# Patient Record
Sex: Female | Born: 1985 | Race: White | Hispanic: No | Marital: Single | Smoking: Former smoker
Health system: Southern US, Community
[De-identification: ages and names within clinical notes are randomized; demographics above are authoritative.]

## PROBLEM LIST (undated history)

## (undated) ENCOUNTER — Emergency Department (HOSPITAL_COMMUNITY): Payer: Medicare Other

## (undated) DIAGNOSIS — E7800 Pure hypercholesterolemia, unspecified: Secondary | ICD-10-CM

## (undated) DIAGNOSIS — R519 Headache, unspecified: Secondary | ICD-10-CM

## (undated) DIAGNOSIS — G510 Bell's palsy: Secondary | ICD-10-CM

## (undated) DIAGNOSIS — G919 Hydrocephalus, unspecified: Secondary | ICD-10-CM

## (undated) DIAGNOSIS — R55 Syncope and collapse: Secondary | ICD-10-CM

## (undated) DIAGNOSIS — I213 ST elevation (STEMI) myocardial infarction of unspecified site: Secondary | ICD-10-CM

## (undated) DIAGNOSIS — R51 Headache: Secondary | ICD-10-CM

## (undated) DIAGNOSIS — I219 Acute myocardial infarction, unspecified: Secondary | ICD-10-CM

## (undated) DIAGNOSIS — Z8669 Personal history of other diseases of the nervous system and sense organs: Secondary | ICD-10-CM

## (undated) HISTORY — DX: Syncope and collapse: R55

## (undated) HISTORY — DX: Headache: R51

## (undated) HISTORY — PX: BRAIN SURGERY: SHX531

## (undated) HISTORY — PX: TONSILECTOMY, ADENOIDECTOMY, BILATERAL MYRINGOTOMY AND TUBES: SHX2538

## (undated) HISTORY — PX: MULTIPLE TOOTH EXTRACTIONS: SHX2053

## (undated) HISTORY — PX: SHUNT REPLACEMENT: SHX5403

## (undated) HISTORY — PX: CORONARY ANGIOPLASTY WITH STENT PLACEMENT: SHX49

## (undated) HISTORY — DX: ST elevation (STEMI) myocardial infarction of unspecified site: I21.3

## (undated) HISTORY — DX: Headache, unspecified: R51.9

---
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
## 2006-05-28 ENCOUNTER — Ambulatory Visit: Payer: Self-pay | Admitting: *Deleted

## 2006-05-28 ENCOUNTER — Encounter: Payer: Self-pay | Admitting: Obstetrics & Gynecology

## 2008-03-12 ENCOUNTER — Inpatient Hospital Stay (HOSPITAL_COMMUNITY): Admission: AD | Admit: 2008-03-12 | Discharge: 2008-03-12 | Payer: Self-pay | Admitting: Obstetrics and Gynecology

## 2008-07-26 ENCOUNTER — Inpatient Hospital Stay (HOSPITAL_COMMUNITY): Admission: AD | Admit: 2008-07-26 | Discharge: 2008-07-27 | Payer: Self-pay | Admitting: Obstetrics and Gynecology

## 2010-02-10 ENCOUNTER — Encounter: Payer: Self-pay | Admitting: *Deleted

## 2010-11-11 ENCOUNTER — Inpatient Hospital Stay (HOSPITAL_COMMUNITY): Payer: Self-pay

## 2010-11-11 ENCOUNTER — Inpatient Hospital Stay (HOSPITAL_COMMUNITY)
Admission: AD | Admit: 2010-11-11 | Discharge: 2010-11-11 | Disposition: A | Discharge status: Home / Self Care | Payer: Self-pay | Source: Ambulatory Visit | Attending: Obstetrics & Gynecology | Admitting: Obstetrics & Gynecology

## 2010-11-11 ENCOUNTER — Encounter (HOSPITAL_COMMUNITY): Payer: Self-pay | Admitting: *Deleted

## 2010-11-11 DIAGNOSIS — R1031 Right lower quadrant pain: Secondary | ICD-10-CM | POA: Insufficient documentation

## 2010-11-11 DIAGNOSIS — N760 Acute vaginitis: Secondary | ICD-10-CM | POA: Insufficient documentation

## 2010-11-11 DIAGNOSIS — A499 Bacterial infection, unspecified: Secondary | ICD-10-CM | POA: Insufficient documentation

## 2010-11-11 DIAGNOSIS — B9689 Other specified bacterial agents as the cause of diseases classified elsewhere: Secondary | ICD-10-CM | POA: Insufficient documentation

## 2010-11-11 HISTORY — DX: Hydrocephalus, unspecified: G91.9

## 2010-11-11 HISTORY — DX: Bell's palsy: G51.0

## 2010-11-11 LAB — URINALYSIS, ROUTINE W REFLEX MICROSCOPIC
Bilirubin Urine: NEGATIVE
Glucose, UA: NEGATIVE mg/dL
Hgb urine dipstick: NEGATIVE
Protein, ur: NEGATIVE mg/dL

## 2010-11-11 LAB — WET PREP, GENITAL: Trich, Wet Prep: NONE SEEN

## 2010-11-11 LAB — CBC
HCT: 42.4 % (ref 36.0–46.0)
Hemoglobin: 14.5 g/dL (ref 12.0–15.0)
MCHC: 34.2 g/dL (ref 30.0–36.0)
MCV: 84.8 fL (ref 78.0–100.0)

## 2010-11-11 LAB — POCT PREGNANCY, URINE: Preg Test, Ur: NEGATIVE

## 2010-11-11 MED ORDER — METRONIDAZOLE 500 MG PO TABS: 500.0000 mg | ORAL_TABLET | Freq: Two times a day (BID) | ORAL | Status: AC

## 2010-11-11 MED ORDER — CYCLOBENZAPRINE HCL 10 MG PO TABS: 10.0000 mg | ORAL_TABLET | Freq: Three times a day (TID) | ORAL | Status: AC | PRN

## 2010-11-11 MED ORDER — KETOROLAC TROMETHAMINE 60 MG/2ML IM SOLN
60.0000 mg | Freq: Once | INTRAMUSCULAR | Status: AC
  Administered 2010-11-11: 60 mg via INTRAMUSCULAR
  Filled 2010-11-11: qty 2

## 2010-11-11 NOTE — Progress Notes (Signed)
Pt c/o RLQ pain x1 week, pt taking Ibuprofen for pain with minimal relief. No fever, normal BM. Pt has a hx of ovarian cysts.

## 2010-11-11 NOTE — Progress Notes (Signed)
Pt LMP 10/28/2010, having RLQ pain x 2 wks that has become more intense.  Pt having clear/pinkish discharge x 3 days that stopped 10/21.  Pt G0 with hx of ovarian cyst.

## 2010-11-11 NOTE — ED Provider Notes (Signed)
History   Pt presents today c/o RLQ pain x 1wk that has worsened over the past couple of days. She also c/o vag dc that stopped a couple of days ago. She states she has a hx of ovarian cysts and is concerned that she has another. She denies fever, diarrhea, dysuria, or any other problems at this time.  Chief Complaint  Patient presents with  . Abdominal Pain   HPI  OB History    Grav Para Term Preterm Abortions TAB SAB Ect Mult Living   0               Past Medical History  Diagnosis Date  . Hydrocephalus   . Bell's palsy     Past Surgical History  Procedure Date  . Shunt replacement     No family history on file.  History  Substance Use Topics  . Smoking status: Current Everyday Smoker -- 0.5 packs/day  . Smokeless tobacco: Not on file  . Alcohol Use: No    Allergies: No Known Allergies  Prescriptions prior to admission  Medication Sig Dispense Refill  . acetaminophen (TYLENOL) 325 MG tablet Take by mouth every 6 (six) hours as needed. Patient used this medication for pain also.  Stated that Tylenol made her sick, so this is why she switched to Ibuprofen.       . Cetirizine HCl (ZYRTEC PO) Take 1 tablet by mouth daily as needed. Patient is using this medication for allergies.       Marland Kitchen ibuprofen (ADVIL,MOTRIN) 200 MG tablet Take 600 mg by mouth every 6 (six) hours as needed. Patient used this medication for pain.         Review of Systems  Constitutional: Negative for fever.  Cardiovascular: Negative for chest pain.  Gastrointestinal: Positive for abdominal pain. Negative for nausea, vomiting, diarrhea and constipation.  Genitourinary: Negative for dysuria, urgency, frequency, hematuria and flank pain.  Neurological: Negative for dizziness and headaches.  Psychiatric/Behavioral: Negative for depression and suicidal ideas.   Physical Exam   Blood pressure 121/55, pulse 78, temperature 98.6 F (37 C), temperature source Oral, resp. rate 16, height 5\' 3"  (1.6 m),  weight 182 lb 12.8 oz (82.918 kg), last menstrual period 10/28/2010.  Physical Exam  Nursing note and vitals reviewed. Constitutional: She is oriented to person, place, and time. She appears well-developed and well-nourished. No distress.  HENT:  Head: Normocephalic and atraumatic.  Eyes: EOM are normal. Pupils are equal, round, and reactive to light.  GI: Soft. She exhibits no distension. There is tenderness. There is no rebound and no guarding.  Genitourinary: No bleeding around the vagina. Vaginal discharge found.       Cervix Lg/closed. Bimanual Exam difficult secondary to pt discomfort.  Neurological: She is alert and oriented to person, place, and time.  Skin: Skin is warm and dry. She is not diaphoretic.  Psychiatric: She has a normal mood and affect. Her behavior is normal. Judgment and thought content normal.    MAU Course  Procedures  Wet prep and GC/Chlamydia cultures done.  Results for orders placed during the hospital encounter of 11/11/10 (from the past 24 hour(s))  URINALYSIS, ROUTINE W REFLEX MICROSCOPIC     Status: Normal   Collection Time   11/11/10  8:25 PM      Component Value Range   Color, Urine YELLOW  YELLOW    Appearance CLEAR  CLEAR    Specific Gravity, Urine 1.010  1.005 - 1.030    pH 6.0  5.0 - 8.0    Glucose, UA NEGATIVE  NEGATIVE (mg/dL)   Hgb urine dipstick NEGATIVE  NEGATIVE    Bilirubin Urine NEGATIVE  NEGATIVE    Ketones, ur NEGATIVE  NEGATIVE (mg/dL)   Protein, ur NEGATIVE  NEGATIVE (mg/dL)   Urobilinogen, UA 0.2  0.0 - 1.0 (mg/dL)   Nitrite NEGATIVE  NEGATIVE    Leukocytes, UA NEGATIVE  NEGATIVE   POCT PREGNANCY, URINE     Status: Normal   Collection Time   11/11/10  8:33 PM      Component Value Range   Preg Test, Ur NEGATIVE    CBC     Status: Abnormal   Collection Time   11/11/10  8:50 PM      Component Value Range   WBC 12.0 (*) 4.0 - 10.5 (K/uL)   RBC 5.00  3.87 - 5.11 (MIL/uL)   Hemoglobin 14.5  12.0 - 15.0 (g/dL)   HCT 16.1   09.6 - 04.5 (%)   MCV 84.8  78.0 - 100.0 (fL)   MCH 29.0  26.0 - 34.0 (pg)   MCHC 34.2  30.0 - 36.0 (g/dL)   RDW 40.9  81.1 - 91.4 (%)   Platelets 276  150 - 400 (K/uL)  WET PREP, GENITAL     Status: Abnormal   Collection Time   11/11/10  8:52 PM      Component Value Range   Yeast, Wet Prep NONE SEEN  NONE SEEN    Trich, Wet Prep NONE SEEN  NONE SEEN    Clue Cells, Wet Prep MODERATE (*) NONE SEEN    WBC, Wet Prep HPF POC FEW (*) NONE SEEN     US Transvaginal Non-ob  11/11/2010  *RADIOLOGY REPORT*  Clinical Data: Right lower quadrant abdominal pain.  TRANSABDOMINAL AND TRANSVAGINAL ULTRASOUND OF PELVIS Technique:  Both transabdominal and transvaginal ultrasound examinations of the pelvis were performed. Transabdominal technique was performed for global imaging of the pelvis including uterus, ovaries, adnexal regions, and pelvic cul-de-sac.  Comparison: Pelvic ultrasound performed 01/23/2004   It was necessary to proceed with endovaginal exam following the transabdominal exam to visualize the uterus and ovaries.  Findings:  Uterus: Normal in size and appearance; measures 6.5 x 2.9 x 3.6 cm.  Endometrium: Normal in thickness and appearance; measures 0.7 cm in thickness.  Right ovary:  Normal appearance/no adnexal mass; measures 3.4 x 2.8 x 2.5 cm.  Left ovary: Normal appearance/no adnexal mass; measures 3.2 x 2.5 x 2.9 cm.  Other findings: No free fluid seen within the pelvic cul-de-sac.  IMPRESSION: Normal study. No evidence of pelvic mass or other significant abnormality.  Original Report Authenticated By: Tonia Ghent, M.D.   US Pelvis Complete  11/11/2010  *RADIOLOGY REPORT*  Clinical Data: Right lower quadrant abdominal pain.  TRANSABDOMINAL AND TRANSVAGINAL ULTRASOUND OF PELVIS Technique:  Both transabdominal and transvaginal ultrasound examinations of the pelvis were performed. Transabdominal technique was performed for global imaging of the pelvis including uterus, ovaries, adnexal  regions, and pelvic cul-de-sac.  Comparison: Pelvic ultrasound performed 01/23/2004   It was necessary to proceed with endovaginal exam following the transabdominal exam to visualize the uterus and ovaries.  Findings:  Uterus: Normal in size and appearance; measures 6.5 x 2.9 x 3.6 cm.  Endometrium: Normal in thickness and appearance; measures 0.7 cm in thickness.  Right ovary:  Normal appearance/no adnexal mass; measures 3.4 x 2.8 x 2.5 cm.  Left ovary: Normal appearance/no adnexal mass; measures 3.2 x 2.5 x 2.9 cm.  Other findings: No free fluid seen within the pelvic cul-de-sac.  IMPRESSION: Normal study. No evidence of pelvic mass or other significant abnormality.  Original Report Authenticated By: Tonia Ghent, M.D.    Assessment and Plan  BV: discussed with pt at length. Will give Rx for Flagyl. Warned of antabuse reaction.  RLQ pain: no clear gyn etiology at this time. Appendicitis is very low on the differential given that the pain has been ongoing for 1wk and pt is afebrile. However, will give precautions. Pt likely has muscle strain. Will give Rx for flexeril. She will f/u with her PCP. Discussed diet, activity, risks, and precautions.  Clinton Gallant. Rice III, DrHSc, MPAS, PA-C  11/11/2010, 8:56 PM   Henrietta Hoover, PA 11/11/10 2203

## 2010-11-12 LAB — GC/CHLAMYDIA PROBE AMP, GENITAL
Chlamydia, DNA Probe: NEGATIVE
GC Probe Amp, Genital: NEGATIVE

## 2011-06-08 ENCOUNTER — Emergency Department (HOSPITAL_COMMUNITY)
Admission: EM | Admit: 2011-06-08 | Discharge: 2011-06-08 | Disposition: A | Discharge status: Home / Self Care | Payer: Self-pay | Attending: Emergency Medicine | Admitting: Emergency Medicine

## 2011-06-08 ENCOUNTER — Emergency Department (HOSPITAL_COMMUNITY): Payer: Self-pay

## 2011-06-08 ENCOUNTER — Encounter (HOSPITAL_COMMUNITY): Payer: Self-pay | Admitting: Physical Medicine and Rehabilitation

## 2011-06-08 DIAGNOSIS — R1013 Epigastric pain: Secondary | ICD-10-CM | POA: Insufficient documentation

## 2011-06-08 DIAGNOSIS — M545 Low back pain, unspecified: Secondary | ICD-10-CM | POA: Insufficient documentation

## 2011-06-08 DIAGNOSIS — R143 Flatulence: Secondary | ICD-10-CM | POA: Insufficient documentation

## 2011-06-08 DIAGNOSIS — R109 Unspecified abdominal pain: Secondary | ICD-10-CM | POA: Insufficient documentation

## 2011-06-08 DIAGNOSIS — R10816 Epigastric abdominal tenderness: Secondary | ICD-10-CM | POA: Insufficient documentation

## 2011-06-08 DIAGNOSIS — R197 Diarrhea, unspecified: Secondary | ICD-10-CM | POA: Insufficient documentation

## 2011-06-08 DIAGNOSIS — F17200 Nicotine dependence, unspecified, uncomplicated: Secondary | ICD-10-CM | POA: Insufficient documentation

## 2011-06-08 DIAGNOSIS — R142 Eructation: Secondary | ICD-10-CM | POA: Insufficient documentation

## 2011-06-08 DIAGNOSIS — R141 Gas pain: Secondary | ICD-10-CM | POA: Insufficient documentation

## 2011-06-08 DIAGNOSIS — R350 Frequency of micturition: Secondary | ICD-10-CM | POA: Insufficient documentation

## 2011-06-08 LAB — DIFFERENTIAL
Basophils Absolute: 0.1 10*3/uL (ref 0.0–0.1)
Lymphocytes Relative: 46 % (ref 12–46)
Monocytes Absolute: 0.6 10*3/uL (ref 0.1–1.0)
Monocytes Relative: 6 % (ref 3–12)
Neutro Abs: 4.3 10*3/uL (ref 1.7–7.7)

## 2011-06-08 LAB — URINALYSIS, ROUTINE W REFLEX MICROSCOPIC
Bilirubin Urine: NEGATIVE
Ketones, ur: NEGATIVE mg/dL
Nitrite: NEGATIVE
Specific Gravity, Urine: 1.016 (ref 1.005–1.030)
Urobilinogen, UA: 0.2 mg/dL (ref 0.0–1.0)

## 2011-06-08 LAB — COMPREHENSIVE METABOLIC PANEL
BUN: 9 mg/dL (ref 6–23)
Calcium: 9.8 mg/dL (ref 8.4–10.5)
Creatinine, Ser: 0.71 mg/dL (ref 0.50–1.10)
GFR calc Af Amer: >90 mL/min (ref >90)
Glucose, Bld: 74 mg/dL (ref 70–99)
Total Protein: 7 g/dL (ref 6.0–8.3)

## 2011-06-08 LAB — CBC
HCT: 41.7 % (ref 36.0–46.0)
Hemoglobin: 14.5 g/dL (ref 12.0–15.0)
RBC: 4.94 MIL/uL (ref 3.87–5.11)
RDW: 12.5 % (ref 11.5–15.5)
WBC: 10.1 10*3/uL (ref 4.0–10.5)

## 2011-06-08 MED ORDER — SODIUM CHLORIDE 0.9 % IV BOLUS (SEPSIS)
1000.0000 mL | Freq: Once | INTRAVENOUS | Status: AC
  Administered 2011-06-08: 1000 mL via INTRAVENOUS

## 2011-06-08 MED ORDER — ONDANSETRON HCL 4 MG/2ML IJ SOLN
4.0000 mg | Freq: Once | INTRAMUSCULAR | Status: AC
  Administered 2011-06-08: 4 mg via INTRAVENOUS
  Filled 2011-06-08: qty 2

## 2011-06-08 MED ORDER — HYDROMORPHONE HCL PF 1 MG/ML IJ SOLN
1.0000 mg | Freq: Once | INTRAMUSCULAR | Status: AC
  Administered 2011-06-08: 1 mg via INTRAVENOUS
  Filled 2011-06-08: qty 1

## 2011-06-08 MED ORDER — TRAMADOL HCL 50 MG PO TABS: 50.0000 mg | ORAL_TABLET | Freq: Four times a day (QID) | ORAL | Status: AC | PRN

## 2011-06-08 NOTE — ED Notes (Signed)
Patient transported to Ultrasound 

## 2011-06-08 NOTE — ED Provider Notes (Signed)
History  Scribed for Gerhard Munch, MD, the patient was seen in room STRE7/STRE7. This chart was scribed by Candelaria Stagers. The patient's care started at 3:54 PM    CSN: [id number]  Arrival date & time 06/08/11  1450   None     Chief Complaint  Patient presents with  . Abdominal Pain  . Back Pain     Abdominal Pain This is a new problem. Associated symptoms include diarrhea and frequency. Pertinent negatives include no fever, nausea or vomiting.   Maria Ellis is a 26 y.o. female who presents to the Emergency Department complaining of abdominal pain that radiates to her lower back on the right side that started several days ago.  She is also experiencing diarrhea increased urination.  She denies vomiting, nausea, or fever.  She has used a heating pad and has taken prescribed pain medication.  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    Past Medical History  Diagnosis Date  . Hydrocephalus   . Bell's palsy     Past Surgical History  Procedure Date  . Shunt replacement     No family history on file.  History  Substance Use Topics  . Smoking status: Current Everyday Smoker -- 0.5 packs/day  . Smokeless tobacco: Not on file  . Alcohol Use: No    OB History    Grav Para Term Preterm Abortions TAB SAB Ect Mult Living   0               Review of Systems  Constitutional: Negative for fever and chills.  Respiratory: Negative for shortness of breath.   Gastrointestinal: Positive for abdominal pain, diarrhea and abdominal distention. Negative for nausea and vomiting.  Genitourinary: Positive for frequency.  Musculoskeletal: Positive for back pain.  Neurological: Negative for weakness.    Allergies  Tylenol  Home Medications   Current Outpatient Rx  Name Route Sig Dispense Refill  . ZYRTEC PO Oral Take 1 tablet by mouth daily as needed. Patient is using this medication for allergies.     . IBUPROFEN 200 MG PO TABS Oral Take 600 mg by mouth  every 6 (six) hours as needed. Patient used this medication for pain.       BP 110/66  Pulse 86  Temp(Src) 98.4 F (36.9 C) (Oral)  Resp 16  SpO2 98%  Physical Exam  Nursing note and vitals reviewed. Constitutional: She is oriented to person, place, and time. She appears well-developed and well-nourished. No distress.  HENT:  Head: Normocephalic and atraumatic.  Eyes: EOM are normal.  Neck: Neck supple. No tracheal deviation present.  Cardiovascular: Normal rate.   Pulmonary/Chest: Effort normal. No respiratory distress.  Abdominal: Bowel sounds are normal. There is tenderness.       Mild epigastric pain.  Murphy's sign present.   Musculoskeletal: Normal range of motion.  Neurological: She is alert and oriented to person, place, and time.  Skin: Skin is warm and dry.  Psychiatric: She has a normal mood and affect. Her behavior is normal.    ED Course  Procedures     COORDINATION OF CARE:  3:03PM Ordered: Pregnancy, urine POC, Urinalysis, Routine w relfex microscopic, POCT Pregnancy, Urine (NOT at [facility])  4:06PM Ordered: CBC, Differential, Lipase, blood;US Abdomen Complete    Labs Reviewed  DIFFERENTIAL - Abnormal; Notable for the following:    Lymphs Abs 4.6 (*)    All other components within normal limits  URINALYSIS, ROUTINE W REFLEX  MICROSCOPIC  POCT PREGNANCY, URINE  CBC  COMPREHENSIVE METABOLIC PANEL  LIPASE, BLOOD   No results found.   No diagnosis found.    MDM  I personally performed the services described in this documentation, which was scribed in my presence. The recorded information has been reviewed and considered.  This generally well appearing young F p/w LUQ / L flank pain.  On exam she is in no distress.  Given the patient's story, her noted Hx of GB disease and the positive Murphy's sign, there was concern for acute cholecystitis (vs. Pyelo or UTI).  Labs, [place] all reassuring.  The patient was resting comfortably on re-eval and was d/c w  community clinic f/u for further eval of her abdominal pain.    Gerhard Munch, MD 06/08/11 [phone]

## 2011-06-08 NOTE — Discharge Instructions (Signed)

## 2011-06-08 NOTE — ED Notes (Signed)
Pt presents to department for evaluation of abdominal pain and R sided back pain. Ongoing for several days. Pt also states "abdomen is bigger than usual." soft and non tender upon exam. No nausea/vomiting. No fever. States urinary frequency. She is alert and oriented x4. No signs of distress at the time.

## 2011-06-27 ENCOUNTER — Ambulatory Visit (INDEPENDENT_AMBULATORY_CARE_PROVIDER_SITE_OTHER): Payer: Self-pay | Admitting: Surgery

## 2011-07-02 ENCOUNTER — Encounter (INDEPENDENT_AMBULATORY_CARE_PROVIDER_SITE_OTHER): Payer: Self-pay | Admitting: Surgery

## 2012-04-04 ENCOUNTER — Encounter (HOSPITAL_COMMUNITY): Payer: Self-pay | Admitting: Family Medicine

## 2012-04-04 ENCOUNTER — Emergency Department (HOSPITAL_COMMUNITY)
Admission: EM | Admit: 2012-04-04 | Discharge: 2012-04-04 | Disposition: A | Discharge status: Home / Self Care | Payer: Self-pay | Attending: Emergency Medicine | Admitting: Emergency Medicine

## 2012-04-04 DIAGNOSIS — Z9889 Other specified postprocedural states: Secondary | ICD-10-CM | POA: Insufficient documentation

## 2012-04-04 DIAGNOSIS — G8929 Other chronic pain: Secondary | ICD-10-CM | POA: Insufficient documentation

## 2012-04-04 DIAGNOSIS — K029 Dental caries, unspecified: Secondary | ICD-10-CM | POA: Insufficient documentation

## 2012-04-04 DIAGNOSIS — K0889 Other specified disorders of teeth and supporting structures: Secondary | ICD-10-CM

## 2012-04-04 DIAGNOSIS — K089 Disorder of teeth and supporting structures, unspecified: Secondary | ICD-10-CM | POA: Insufficient documentation

## 2012-04-04 DIAGNOSIS — K027 Dental root caries: Secondary | ICD-10-CM

## 2012-04-04 DIAGNOSIS — Z8669 Personal history of other diseases of the nervous system and sense organs: Secondary | ICD-10-CM | POA: Insufficient documentation

## 2012-04-04 DIAGNOSIS — Z982 Presence of cerebrospinal fluid drainage device: Secondary | ICD-10-CM | POA: Insufficient documentation

## 2012-04-04 DIAGNOSIS — F17200 Nicotine dependence, unspecified, uncomplicated: Secondary | ICD-10-CM | POA: Insufficient documentation

## 2012-04-04 MED ORDER — OXYCODONE HCL 5 MG PO TABS: 5.0000 mg | ORAL_TABLET | ORAL | Status: DC | PRN

## 2012-04-04 MED ORDER — PENICILLIN V POTASSIUM 250 MG PO TABS
500.0000 mg | ORAL_TABLET | Freq: Once | ORAL | Status: AC
  Administered 2012-04-04: 500 mg via ORAL
  Filled 2012-04-04: qty 2

## 2012-04-04 MED ORDER — OXYCODONE HCL 5 MG PO TABS
5.0000 mg | ORAL_TABLET | Freq: Once | ORAL | Status: AC
  Administered 2012-04-04: 5 mg via ORAL
  Filled 2012-04-04: qty 1

## 2012-04-04 MED ORDER — PENICILLIN V POTASSIUM 500 MG PO TABS: 500.0000 mg | ORAL_TABLET | Freq: Three times a day (TID) | ORAL | Status: DC

## 2012-04-04 NOTE — ED Notes (Signed)
Pt st's right upper back tooth was broken while another tooth was being pulled several months ago.  Now tooth is very painful.

## 2012-04-04 NOTE — ED Notes (Signed)
Per pt abscessed tooth right upper area. sts she has an appointment with dentist on Tuesday,

## 2012-04-04 NOTE — ED Provider Notes (Signed)
History    This chart was scribed for non-physician practitioner working with Hurman Horn, MD by Melba Coon, ED Scribe. This patient was seen in room TR10C/TR10C and the patient's care was started at 4:06PM.     CSN: [id number]  Arrival date & time 04/04/12  1451   First MD Initiated Contact with Patient 04/04/12 1526      Chief Complaint  Patient presents with  . Dental Pain    (Consider location/radiation/quality/duration/timing/severity/associated sxs/prior treatment) The history is provided by the patient. No language interpreter was used.   Maria Ellis is a 27 y.o. female who presents to the Emergency Department complaining of constant, moderate to severe right upper dental pain with an onset 3-4 days that is getting progressively worse.  She reports that, a couple of years ago, she had a dental procedure in [facility] and she reports that, during the procedure, the operating dentists caused an accidental dental fracture which then started her chronic history of dental pain. Ibuprofen mildly alleviate the pain. She reports she has an appointment with her dentist in 2 days. Denies fever, neck pain, sore throat, rash, back pain, CP, SOB, abdominal pain, nausea, emesis, diarrhea, dysuria, or extremity pain, edema, weakness, numbness, or tingling. Allergic to Tylenol. No other pertinent medical symptoms. She is a current everyday smoker.  Past Medical History  Diagnosis Date  . Hydrocephalus   . Bell's palsy     Past Surgical History  Procedure Laterality Date  . Shunt replacement      History reviewed. No pertinent family history.  History  Substance Use Topics  . Smoking status: Current Every Day Smoker -- 0.50 packs/day  . Smokeless tobacco: Not on file  . Alcohol Use: No    OB History   Grav Para Term Preterm Abortions TAB SAB Ect Mult Living   0               Review of Systems 10 Systems reviewed and all are negative for acute change except as noted in  the HPI.   Allergies  Tylenol  Home Medications   Current Outpatient Rx  Name  Route  Sig  Dispense  Refill  . ibuprofen (ADVIL,MOTRIN) 200 MG tablet   Oral   Take 600 mg by mouth every 6 (six) hours as needed for pain.            BP 128/80  Pulse 83  Temp(Src) 98.3 F (36.8 C)  Resp 16  SpO2 98%  LMP 04/04/2012  Physical Exam  Nursing note and vitals reviewed. Constitutional: She is oriented to person, place, and time. She appears well-developed and well-nourished. No distress.  HENT:  Head: Normocephalic.  Dental exam: dentition shows mild decay at tooth #4 with partial subacute fracture with exposure of pulp. Minimal gingival swelling present without pointing abscess. Some submental adenopathy present.  Eyes: EOM are normal.  Neck: Neck supple. No tracheal deviation present.  Cardiovascular: Normal rate.   Pulmonary/Chest: Effort normal. No respiratory distress.  Musculoskeletal: Normal range of motion.  Neurological: She is alert and oriented to person, place, and time.  Skin: Skin is warm and dry.  Psychiatric: She has a normal mood and affect. Her behavior is normal.    ED Course  Procedures (including critical care time)  DIAGNOSTIC STUDIES: Oxygen Saturation is 98% on room air, normal by my interpretation.    COORDINATION OF CARE:  4:09PM - oxycodone and penicillin will be ordered for Rosabel M Gellatly. She is advised  to keep her appointment with her dentist and follow up as soon as possible. She is ready for d/c.   Labs Reviewed - No data to display No results found.   No diagnosis found.  1. Dental pain 2.   MDM  nonacute dental pain secondary to decay.   I personally performed the services described in this documentation, which was scribed in my presence. The recorded information has been reviewed and is accurate.         Arnoldo Hooker, PA-C 04/06/12 [phone]

## 2012-04-06 NOTE — ED Provider Notes (Signed)
Medical screening examination/treatment/procedure(s) were performed by non-physician practitioner and as supervising physician I was immediately available for consultation/collaboration.   Tyasia Packard M Susie Pousson, MD 04/06/12 1726 

## 2012-05-21 ENCOUNTER — Encounter (HOSPITAL_COMMUNITY): Payer: Self-pay | Admitting: Emergency Medicine

## 2012-05-21 ENCOUNTER — Emergency Department (HOSPITAL_COMMUNITY)
Admission: EM | Admit: 2012-05-21 | Discharge: 2012-05-21 | Disposition: A | Discharge status: Home / Self Care | Payer: Self-pay | Attending: Emergency Medicine | Admitting: Emergency Medicine

## 2012-05-21 DIAGNOSIS — Z8669 Personal history of other diseases of the nervous system and sense organs: Secondary | ICD-10-CM | POA: Insufficient documentation

## 2012-05-21 DIAGNOSIS — F17200 Nicotine dependence, unspecified, uncomplicated: Secondary | ICD-10-CM | POA: Insufficient documentation

## 2012-05-21 DIAGNOSIS — R220 Localized swelling, mass and lump, head: Secondary | ICD-10-CM | POA: Insufficient documentation

## 2012-05-21 DIAGNOSIS — R221 Localized swelling, mass and lump, neck: Secondary | ICD-10-CM | POA: Insufficient documentation

## 2012-05-21 DIAGNOSIS — Z880 Allergy status to penicillin: Secondary | ICD-10-CM | POA: Insufficient documentation

## 2012-05-21 DIAGNOSIS — K029 Dental caries, unspecified: Secondary | ICD-10-CM | POA: Insufficient documentation

## 2012-05-21 DIAGNOSIS — K0889 Other specified disorders of teeth and supporting structures: Secondary | ICD-10-CM

## 2012-05-21 DIAGNOSIS — K089 Disorder of teeth and supporting structures, unspecified: Secondary | ICD-10-CM | POA: Insufficient documentation

## 2012-05-21 MED ORDER — TRAMADOL HCL 50 MG PO TABS: 50.0000 mg | ORAL_TABLET | Freq: Four times a day (QID) | ORAL | Status: DC | PRN

## 2012-05-21 MED ORDER — CLINDAMYCIN HCL 150 MG PO CAPS
300.0000 mg | ORAL_CAPSULE | Freq: Three times a day (TID) | ORAL | Status: DC
Start: 2012-05-21 — End: 2012-06-13

## 2012-05-21 NOTE — ED Notes (Signed)
PT. REPORTS LEFT UPPER INCISOR PAIN FOR SEVERAL DAYS UNRELIEVED BY OTC IBUPROFEN .

## 2012-05-21 NOTE — ED Provider Notes (Signed)
History    This chart was scribed for non-physician practitioner Raymon Mutton, PA-C working with Rolan Bucco, MD by Toya Smothers, ED Scribe. This patient was seen in room TR07C/TR07C and the patient's care was started at 11:14 PM.   CSN: [id number]  Arrival date & time 05/21/12  2112   First MD Initiated Contact with Patient 05/21/12 2229      Chief Complaint  Patient presents with  . Dental Pain   Patient is a 27 y.o. female presenting with tooth pain. The history is provided by the patient. No language interpreter was used.  Dental PainPrimary symptoms do not include headaches, fever, shortness of breath or sore throat.  Additional symptoms include: facial swelling. Additional symptoms do not include: trouble swallowing.    Maria Ellis is a 27 y.o. female who presents to the Emergency Department complaining of 3 days of recurrent oral abscess. Pain is constant, sharp, radiating from left upper maxillary to lower left side, and worse with chewing. Pt denies recent oral surgery. Despite taking Ibuprofen, there has been no improvement to pain. No fever, chills, cough, congestion, rhinorrhea, chest pain, SOB, or n/v/d. Pt is a current everyday smoker, denying alcohol and illicit drug use. Allergies include     Past Medical History  Diagnosis Date  . Hydrocephalus   . Bell's palsy     Past Surgical History  Procedure Laterality Date  . Shunt replacement      No family history on file.  History  Substance Use Topics  . Smoking status: Current Every Day Smoker -- 0.50 packs/day  . Smokeless tobacco: Not on file  . Alcohol Use: No    Review of Systems  Constitutional: Negative for fever and diaphoresis.  HENT: Positive for facial swelling and dental problem. Negative for sore throat, trouble swallowing, neck pain, neck stiffness and voice change.   Eyes: Negative for visual disturbance.  Respiratory: Negative for apnea, chest tightness and shortness of breath.    Cardiovascular: Negative for chest pain and palpitations.  Gastrointestinal: Negative for nausea, vomiting, diarrhea and constipation.  Genitourinary: Negative for dysuria.  Musculoskeletal: Negative for gait problem.  Skin: Negative for rash.  Neurological: Negative for dizziness, weakness, light-headedness, numbness and headaches.  All other systems reviewed and are negative.    Allergies  Penicillins and Tylenol  Home Medications   Current Outpatient Rx  Name  Route  Sig  Dispense  Refill  . ibuprofen (ADVIL,MOTRIN) 200 MG tablet   Oral   Take 600 mg by mouth every 6 (six) hours as needed for pain.          . clindamycin (CLEOCIN) 150 MG capsule   Oral   Take 2 capsules (300 mg total) by mouth 3 (three) times daily.   60 capsule   0   . traMADol (ULTRAM) 50 MG tablet   Oral   Take 1 tablet (50 mg total) by mouth every 6 (six) hours as needed for pain.   10 tablet   0     BP 129/81  Pulse 102  Temp(Src) 98.8 F (37.1 C) (Oral)  Resp 18  SpO2 97%  LMP 04/29/2012  Physical Exam  Nursing note and vitals reviewed. Constitutional: She is oriented to person, place, and time. She appears well-developed and well-nourished. No distress.  HENT:  Head: Normocephalic and atraumatic. No trismus in the jaw.    Mouth/Throat: Uvula is midline, oropharynx is clear and moist and mucous membranes are normal. Mucous membranes are not pale and  not dry. She does not have dentures. No oral lesions. Dental caries present. No dental abscesses, edematous or lacerations. No oropharyngeal exudate, posterior oropharyngeal edema, posterior oropharyngeal erythema or tonsillar abscesses.    Uvula midline, symmetrical  Mouth: Facial swelling noted to the left side - no erythema, inflammation, rash, lesions noted to external face.  Discomfort upon palpation to the left maxillary region, left cheek, left aspect of vermillion border Dental caries noted all over mouth. Negative  inflammation, erythema, lesions, sores to buccal mucosa, maxillary gums, mandibular gums Discomfort with palpation to the left upper and lower gums with tongue depressor Negative peritonsillar abscess noted No sublingual lesion noted Negative trismus  Eyes: Conjunctivae and EOM are normal. Pupils are equal, round, and reactive to light. Right eye exhibits no discharge. Left eye exhibits no discharge.  Neck: Normal range of motion. Neck supple. No tracheal deviation present.  Negative lymphadenopathy Negative nuchal rigidity Negative neck stiffness  Cardiovascular: Normal rate and regular rhythm.  Exam reveals no friction rub.   No murmur heard. Radial pulses 2+ bilaterally  Pulmonary/Chest: Effort normal and breath sounds normal. No respiratory distress. She has no wheezes. She has no rales.  Musculoskeletal: Normal range of motion. She exhibits no edema.  Lymphadenopathy:    She has no cervical adenopathy.  Neurological: She is alert and oriented to person, place, and time. She has normal reflexes. No cranial nerve deficit. She exhibits normal muscle tone. Coordination normal.  Cranial nerves III-XII grossly intact  Skin: Skin is warm and dry. No rash noted. She is not diaphoretic. No erythema.  Psychiatric: She has a normal mood and affect. Her behavior is normal. Thought content normal.    ED Course  Procedures DIAGNOSTIC STUDIES: Oxygen Saturation is 97% on room air, normal by my interpretation.    COORDINATION OF CARE: 23:14- Evaluated Pt. Pt is awake, alert, and without distress. 23:20- Patient understands and agrees with initial ED impression and plan with expectations set for ED visit.   Plan: Home Medications- Antibiotics ; Recommended follow up- Oral Surgeon on Monday   Labs Reviewed - No data to display No results found.   1. Pain, dental       MDM  I personally performed the services described in this documentation, which was scribed in my presence. The  recorded information has been reviewed and is accurate.  I personally evaluated and examined the patient. Patient afebrile, normotensive, non-tachycardic, alert and oriented. Facial swelling noted to the left side. Negative peritonsillar abscess. Negative Ludwig's angina. Negative abscess or cyst formation to mouth. Numerous dental caries, cracked teeth scattered to upper and lower jaws. Patient aseptic, non-toxic appearing, in no acute distress - discharged patient. Patient given antibiotic coverage and pain medications - discussed course, restrictions, and precautions while on pain medications. Referred patient to dentist - resource guide given. Discussed with patient to ice. Discussed with patient to monitor symptoms and if symptoms are to worsen or change to please report back to the ED. Patient agreed to plan of care, understood, all questions answered.   Raymon Mutton, PA-C 05/22/12 [phone]

## 2012-05-21 NOTE — ED Notes (Signed)
Report to eme, rn 

## 2012-05-22 NOTE — ED Provider Notes (Signed)
Medical screening examination/treatment/procedure(s) were performed by non-physician practitioner and as supervising physician I was immediately available for consultation/collaboration.   Lynkin Saini, MD 05/22/12 1530 

## 2012-06-13 ENCOUNTER — Emergency Department (HOSPITAL_COMMUNITY): Payer: Self-pay

## 2012-06-13 ENCOUNTER — Emergency Department (HOSPITAL_COMMUNITY)
Admission: EM | Admit: 2012-06-13 | Discharge: 2012-06-13 | Disposition: A | Discharge status: Home / Self Care | Payer: Self-pay | Attending: Emergency Medicine | Admitting: Emergency Medicine

## 2012-06-13 ENCOUNTER — Encounter (HOSPITAL_COMMUNITY): Payer: Self-pay | Admitting: Nurse Practitioner

## 2012-06-13 DIAGNOSIS — G911 Obstructive hydrocephalus: Secondary | ICD-10-CM | POA: Insufficient documentation

## 2012-06-13 DIAGNOSIS — G919 Hydrocephalus, unspecified: Secondary | ICD-10-CM

## 2012-06-13 DIAGNOSIS — Z8669 Personal history of other diseases of the nervous system and sense organs: Secondary | ICD-10-CM | POA: Insufficient documentation

## 2012-06-13 DIAGNOSIS — F17200 Nicotine dependence, unspecified, uncomplicated: Secondary | ICD-10-CM | POA: Insufficient documentation

## 2012-06-13 DIAGNOSIS — R079 Chest pain, unspecified: Secondary | ICD-10-CM | POA: Insufficient documentation

## 2012-06-13 DIAGNOSIS — IMO0001 Reserved for inherently not codable concepts without codable children: Secondary | ICD-10-CM | POA: Insufficient documentation

## 2012-06-13 DIAGNOSIS — M7918 Myalgia, other site: Secondary | ICD-10-CM

## 2012-06-13 DIAGNOSIS — R1012 Left upper quadrant pain: Secondary | ICD-10-CM | POA: Insufficient documentation

## 2012-06-13 DIAGNOSIS — Z3202 Encounter for pregnancy test, result negative: Secondary | ICD-10-CM | POA: Insufficient documentation

## 2012-06-13 LAB — BASIC METABOLIC PANEL
CO2: 22 mEq/L (ref 19–32)
Chloride: 104 mEq/L (ref 96–112)
GFR calc non Af Amer: >90 mL/min (ref >90)
Glucose, Bld: 136 mg/dL — ABNORMAL HIGH (ref 70–99)
Potassium: 3.6 mEq/L (ref 3.5–5.1)
Sodium: 137 mEq/L (ref 135–145)

## 2012-06-13 LAB — CBC
HCT: 37.6 % (ref 36.0–46.0)
Hemoglobin: 13.2 g/dL (ref 12.0–15.0)
MCH: 29.2 pg (ref 26.0–34.0)
MCV: 83.2 fL (ref 78.0–100.0)
RBC: 4.52 MIL/uL (ref 3.87–5.11)

## 2012-06-13 LAB — POCT PREGNANCY, URINE: Preg Test, Ur: NEGATIVE

## 2012-06-13 LAB — URINALYSIS, ROUTINE W REFLEX MICROSCOPIC
Glucose, UA: NEGATIVE mg/dL
Leukocytes, UA: NEGATIVE
Protein, ur: NEGATIVE mg/dL

## 2012-06-13 LAB — POCT I-STAT TROPONIN I: Troponin i, poc: 0.02 ng/mL (ref 0.00–0.08)

## 2012-06-13 LAB — HEPATIC FUNCTION PANEL
AST: 15 U/L (ref 0–37)
Bilirubin, Direct: <0.1 mg/dL (ref 0.0–0.3)

## 2012-06-13 MED ORDER — HYDROMORPHONE HCL PF 1 MG/ML IJ SOLN
1.0000 mg | Freq: Once | INTRAMUSCULAR | Status: AC
  Administered 2012-06-13: 1 mg via INTRAVENOUS
  Filled 2012-06-13: qty 1

## 2012-06-13 MED ORDER — TRAMADOL HCL 50 MG PO TABS: 50.0000 mg | ORAL_TABLET | Freq: Three times a day (TID) | ORAL | Status: DC | PRN

## 2012-06-13 MED ORDER — TRAMADOL HCL 50 MG PO TABS: ORAL_TABLET | ORAL | Status: DC

## 2012-06-13 NOTE — ED Notes (Signed)
States she "Feels like someone is kicking me in the chest and its hard to breathe" for past 3 days. Reports dry cough, 101.6 fever last night at home taking ibuprofen with some relief. A&Ox4, pt with mild labored breathing on exertion

## 2012-06-13 NOTE — ED Notes (Signed)
Patient transported to Ultrasound 

## 2012-06-13 NOTE — ED Notes (Signed)
Pt ambulating independently w/ steady gait on d/c in no acute distress, A&Ox4. D/c instructions reviewed w/ pt and family - pt and family deny any further questions or concerns at present. Rx given x1  

## 2012-06-13 NOTE — ED Provider Notes (Signed)
History     CSN: [id number]  Arrival date & time 06/13/12  1336   First MD Initiated Contact with Patient 06/13/12 1430      Chief Complaint  Patient presents with  . Chest Pain    (Consider location/radiation/quality/duration/timing/severity/associated sxs/prior treatment) HPI Comments: Maria Ellis is a 27 y/o F with PMHx of hydrocephalus and Bell's palsy, presenting to the ED with chest pain that has been ongoing for the past 2 days. Patient reported that the chest pain began in the substernal region and has now localizing to the left side of her chest, underneath her left breast, LUQ, and left side of her back. Patient described pain as a constant stabbing sensation - described "as if someone kicked me in the ribs." Patient reported that the pain gets worse when the patient takes a deep inhalation , breathing in general, and when laying on the left side. Patient reported that she has been having a mild dry cough for the past couple of days. Patient reported that she had a fever last night (100.6) took an Ibuprofen and stated that the fever broke. Patient reported that she was experiencing epigastric discomfort described as an intermittent pressure that lasts a couple of minutes. Stated that she noticed her tongue swell today - denied changes to diet, trouble breathing, blockage of airway, difficulty breathing, voice changes, difficulty speaking. Denied vomiting, diarrhea, light-headedness, shortness of breath, difficulty breathing, visual distortions, paresthesias.   The history is provided by the patient. No language interpreter was used.    Past Medical History  Diagnosis Date  . Hydrocephalus   . Bell's palsy     Past Surgical History  Procedure Laterality Date  . Shunt replacement      History reviewed. No pertinent family history.  History  Substance Use Topics  . Smoking status: Current Every Day Smoker -- 0.50 packs/day    Types: Cigarettes  . Smokeless tobacco: Not  on file  . Alcohol Use: No    OB History   Grav Para Term Preterm Abortions TAB SAB Ect Mult Living   0               Review of Systems  Constitutional: Positive for fever. Negative for chills and fatigue.  HENT: Negative for ear pain, sore throat, trouble swallowing, neck pain and neck stiffness.   Eyes: Negative for photophobia, pain and visual disturbance.  Respiratory: Positive for cough and chest tightness. Negative for shortness of breath.   Cardiovascular: Positive for chest pain.  Gastrointestinal: Positive for nausea and abdominal pain (LUQ). Negative for vomiting, diarrhea and constipation.  Musculoskeletal: Negative for back pain and arthralgias.  Skin: Negative for rash.  Neurological: Negative for dizziness, weakness, light-headedness and headaches.  All other systems reviewed and are negative.    Allergies  Penicillins and Tylenol  Home Medications   Current Outpatient Rx  Name  Route  Sig  Dispense  Refill  . ibuprofen (ADVIL,MOTRIN) 200 MG tablet   Oral   Take 600 mg by mouth every 6 (six) hours as needed for pain.          Marland Kitchen OVER THE COUNTER MEDICATION   Oral   Take 10 mLs by mouth every 4 (four) hours as needed ([facility] robitussin for cough.).         Marland Kitchen traMADol (ULTRAM) 50 MG tablet      Please take 1 tablet PO at night as when needed for pain.   7 tablet   0  BP 110/64  Pulse 62  Temp(Src) 98.5 F (36.9 C) (Oral)  Resp 16  SpO2 99%  LMP 04/29/2012  Physical Exam  Nursing note and vitals reviewed. Constitutional: She is oriented to person, place, and time. She appears well-developed and well-nourished. No distress.  HENT:  Head: Normocephalic and atraumatic.  Mouth/Throat: Oropharynx is clear and moist. No oropharyngeal exudate.  Uvula midline, symmetrical elevation  Negative tongue swelling noted.   Eyes: Conjunctivae and EOM are normal. Pupils are equal, round, and reactive to light. Right eye exhibits no discharge. Left  eye exhibits no discharge.  Neck: Normal range of motion. Neck supple. No tracheal deviation present. No thyromegaly present.  Negative neck stiffness Negative nuchal rigidity Negative lymphadenopathy  Cardiovascular: Normal rate, regular rhythm and normal heart sounds.  Exam reveals no friction rub.   No murmur heard. Pulses:      Radial pulses are 2+ on the right side, and 2+ on the left side.       Dorsalis pedis pulses are 2+ on the right side, and 2+ on the left side.  Pulmonary/Chest: Effort normal and breath sounds normal. No respiratory distress. She has no wheezes. She has no rales. She exhibits no tenderness.  Abdominal: Soft. Bowel sounds are normal. She exhibits no distension and no mass. There is tenderness. There is no rigidity, no rebound, no guarding and no tenderness at McBurney's point.    Tenderness upon palpation to the left mid-axillary portion of the ribs, LUQ   Musculoskeletal: Normal range of motion. She exhibits no edema and no tenderness.  Pain to left side of chest wall with left arm straight up and pressure applied and with resistance  Lymphadenopathy:    She has no cervical adenopathy.  Neurological: She is alert and oriented to person, place, and time. No cranial nerve deficit. She exhibits normal muscle tone. Coordination normal.  Skin: Skin is warm and dry. No rash noted. She is not diaphoretic. No erythema.  Psychiatric: She has a normal mood and affect. Her behavior is normal. Thought content normal.    ED Course  Procedures (including critical care time)  Dilaudid IV given for pain   Date: 06/13/2012  Rate: 87  Rhythm: normal sinus rhythm  QRS Axis: normal  Intervals: normal  ST/T Wave abnormalities: normal  Conduction Disutrbances:none  Narrative Interpretation: Normal EKG   Old EKG Reviewed: none available    Labs Reviewed  BASIC METABOLIC PANEL - Abnormal; Notable for the following:    Glucose, Bld 136 (*)    All other components  within normal limits  HEPATIC FUNCTION PANEL - Abnormal; Notable for the following:    Total Bilirubin 0.2 (*)    All other components within normal limits  PRO B NATRIURETIC PEPTIDE  CBC  LIPASE, BLOOD  URINALYSIS, ROUTINE W REFLEX MICROSCOPIC  D-DIMER, QUANTITATIVE  POCT I-STAT TROPONIN I  POCT PREGNANCY, URINE   Dg Chest 2 View  06/13/2012   *RADIOLOGY REPORT*  Clinical Data: Chest pain.  Smoker.  CHEST - 2 VIEW  Comparison: 06/10/2012.  Findings: Normal sized heart.  Clear lungs.  Stable prominence of the pulmonary vasculature and interstitial markings.  Stable right ventriculoperitoneal shunt tubing ending in the upper abdomen. Minimal scoliosis.  IMPRESSION: No acute abnormality.  Stable mild pulmonary vascular congestion and mild chronic interstitial lung disease.   Original Report Authenticated By: Beckie Salts, M.D.   US Abdomen Complete  06/13/2012   *RADIOLOGY REPORT*  Clinical Data:  Left upper quadrant pain  COMPLETE  ABDOMINAL ULTRASOUND  Comparison:  CT 06/08/2012  Findings:  Gallbladder:  No gallstones, gallbladder wall thickening, or pericholecystic fluid.  Common bile duct:  Normal 5 mm.  Liver:  No focal lesion identified.  Within normal limits in parenchymal echogenicity.  IVC:  Appears normal.  Pancreas:  No focal abnormality seen.  Spleen:  Normal in size and echogenicity.  Right Kidney:  12.5cm in length.  No evidence of hydronephrosis or stones.  Left Kidney:  10.7cm in length.  No evidence of hydronephrosis or stones.  Abdominal aorta:  No aneurysm identified.  IMPRESSION: Negative abdominal ultrasound.   Original Report Authenticated By: Genevive Bi, M.D.     1. Chest pain   2. Musculoskeletal pain   3. Hydrocephalus   4. H/O Bell's palsy       MDM  Patient afebrile, non-tachycardic, non-tachypneic, normotensive, adequate aspiration on room air, alert and oriented.  DDx: Cardiac  GERD Kidney Stones   Urine pregnancy negative UA negative findings Lipase  within normal limits (29) BMP negative findings CBC negative findings BMP within normal limits (107.5) EKG negative STEMI/NSTEMI noted  Troponins negative (0.02) Chest xray - no acute abnormality noted. Stable mild pulmonary vascular congestion and mild chronic interstitial lung disease noted.  US abdomen = negative hydronephrosis bilaterally Hepatic function panel negative findings D-dimer negative elevation - (0.30) - r/o PE  Chest pain suspected to be associated with musculoskeletal etiology - less likely cardiac due to no elevation in troponins, EKG negative findings - patient in sinus rhythm while in ED setting. Negative elevated D-Dimer. Experienced discomfort upon palpation to the left side of the chest wall - discomfort when left arm stretched - musculoskeletal. Discussed case and findings with Dr. Jinger Neighbors - cleared patient for discharge. Patient aseptic, non-toxic appearing, in no acute distress, no respiratory distress noted. Discharged patient with pain medications to be taken at night - as when needed - discussed with patient precautions. Discussed with patient to rest and stay hydrated. Discussed to use Ibuprofen throughout the day for relief. Discussed with patient follow-up with cardio, ENT, and [facility]. List of resources given. Recommended benadryl as when needed to aid in decrease tongue swelling - discussed to monitor what she ingests. Discussed with patient to monitor symptoms and if symptoms are to worsen or change to report back to the ED. Patient agreed to plan of care, understood, all questions answered.         Raymon Mutton, PA-C 06/13/12 [phone]

## 2012-06-14 NOTE — ED Provider Notes (Signed)
Medical screening examination/treatment/procedure(s) were conducted as a shared visit with non-physician practitioner(s) and myself.  I personally evaluated the patient during the encounter   Danuel Felicetti, MD 06/14/12 1840 

## 2012-11-19 DIAGNOSIS — Q054 Unspecified spina bifida with hydrocephalus: Secondary | ICD-10-CM

## 2012-11-19 DIAGNOSIS — IMO0002 Reserved for concepts with insufficient information to code with codable children: Secondary | ICD-10-CM | POA: Insufficient documentation

## 2012-11-19 HISTORY — DX: Unspecified spina bifida with hydrocephalus: Q05.4

## 2013-01-23 ENCOUNTER — Encounter (HOSPITAL_COMMUNITY): Payer: Self-pay | Admitting: Emergency Medicine

## 2013-01-23 ENCOUNTER — Emergency Department (HOSPITAL_COMMUNITY)
Admission: EM | Admit: 2013-01-23 | Discharge: 2013-01-23 | Discharge status: Against Medical Advice | Payer: Self-pay | Attending: Emergency Medicine | Admitting: Emergency Medicine

## 2013-01-23 DIAGNOSIS — R51 Headache: Secondary | ICD-10-CM | POA: Insufficient documentation

## 2013-01-23 DIAGNOSIS — R112 Nausea with vomiting, unspecified: Secondary | ICD-10-CM | POA: Insufficient documentation

## 2013-01-23 DIAGNOSIS — R52 Pain, unspecified: Secondary | ICD-10-CM | POA: Insufficient documentation

## 2013-01-23 DIAGNOSIS — F17200 Nicotine dependence, unspecified, uncomplicated: Secondary | ICD-10-CM | POA: Insufficient documentation

## 2013-01-23 DIAGNOSIS — Z982 Presence of cerebrospinal fluid drainage device: Secondary | ICD-10-CM | POA: Insufficient documentation

## 2013-01-23 DIAGNOSIS — Z87728 Personal history of other specified (corrected) congenital malformations of nervous system and sense organs: Secondary | ICD-10-CM | POA: Insufficient documentation

## 2013-01-23 LAB — URINALYSIS, ROUTINE W REFLEX MICROSCOPIC
Bilirubin Urine: NEGATIVE
GLUCOSE, UA: NEGATIVE mg/dL
Hgb urine dipstick: NEGATIVE
Ketones, ur: NEGATIVE mg/dL
Nitrite: NEGATIVE
Protein, ur: NEGATIVE mg/dL
Specific Gravity, Urine: 1.011 (ref 1.005–1.030)
Urobilinogen, UA: 1 mg/dL (ref 0.0–1.0)
pH: 6 (ref 5.0–8.0)

## 2013-01-23 LAB — URINE MICROSCOPIC-ADD ON

## 2013-01-23 LAB — POCT PREGNANCY, URINE: PREG TEST UR: NEGATIVE

## 2013-01-23 NOTE — ED Notes (Signed)
Pt st's she started having pain in back of her head, aching all over nausea and vomiting onset earlier tonight.

## 2013-01-23 NOTE — ED Notes (Signed)
Call no answer 

## 2013-01-23 NOTE — ED Notes (Signed)
Patient called again, no answer.

## 2013-04-17 ENCOUNTER — Emergency Department (HOSPITAL_COMMUNITY)
Admission: EM | Admit: 2013-04-17 | Discharge: 2013-04-17 | Disposition: A | Discharge status: Home / Self Care | Payer: Medicaid Other | Attending: Emergency Medicine | Admitting: Emergency Medicine

## 2013-04-17 ENCOUNTER — Encounter (HOSPITAL_COMMUNITY): Payer: Self-pay | Admitting: Emergency Medicine

## 2013-04-17 DIAGNOSIS — G510 Bell's palsy: Secondary | ICD-10-CM | POA: Insufficient documentation

## 2013-04-17 DIAGNOSIS — G911 Obstructive hydrocephalus: Secondary | ICD-10-CM | POA: Insufficient documentation

## 2013-04-17 DIAGNOSIS — M542 Cervicalgia: Secondary | ICD-10-CM | POA: Insufficient documentation

## 2013-04-17 DIAGNOSIS — R51 Headache: Secondary | ICD-10-CM | POA: Insufficient documentation

## 2013-04-17 DIAGNOSIS — Z8669 Personal history of other diseases of the nervous system and sense organs: Secondary | ICD-10-CM | POA: Insufficient documentation

## 2013-04-17 DIAGNOSIS — Z880 Allergy status to penicillin: Secondary | ICD-10-CM | POA: Insufficient documentation

## 2013-04-17 DIAGNOSIS — F17200 Nicotine dependence, unspecified, uncomplicated: Secondary | ICD-10-CM | POA: Insufficient documentation

## 2013-04-17 DIAGNOSIS — Z79899 Other long term (current) drug therapy: Secondary | ICD-10-CM | POA: Insufficient documentation

## 2013-04-17 DIAGNOSIS — Z888 Allergy status to other drugs, medicaments and biological substances status: Secondary | ICD-10-CM | POA: Insufficient documentation

## 2013-04-17 HISTORY — DX: Personal history of other diseases of the nervous system and sense organs: Z86.69

## 2013-04-17 MED ORDER — GABAPENTIN 300 MG PO CAPS: ORAL_CAPSULE | ORAL | Status: DC

## 2013-04-17 MED ORDER — OXYCODONE HCL 5 MG PO CAPS: 5.0000 mg | ORAL_CAPSULE | ORAL | Status: DC | PRN

## 2013-04-17 MED ORDER — OXYCODONE HCL 5 MG PO TABS
10.0000 mg | ORAL_TABLET | Freq: Once | ORAL | Status: AC
  Administered 2013-04-17: 10 mg via ORAL
  Filled 2013-04-17: qty 2

## 2013-04-17 MED ORDER — METHOCARBAMOL 500 MG PO TABS: 500.0000 mg | ORAL_TABLET | Freq: Two times a day (BID) | ORAL | Status: DC

## 2013-04-17 NOTE — ED Provider Notes (Signed)
CSN: [id number]     Arrival date & time 04/17/13  1351 History   First MD Initiated Contact with Patient 04/17/13 1510     Chief Complaint  Patient presents with  . Neck Pain     ) HPI  Patient presents with neck and head pain. 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 She states "they tell me that the pain was going away it hasn't". States she has pain most days. Primarily paraspinal into her mid thoracic spine. She does have a degree of muscle spasm associated with it. She states "it triggers migraines". She states she is indigent, she is not currently working. She has appointment at [facility] in[place]. Her first appointment is not until April 22. She states she is miserable with pain. No fevers. No vision changes. No numbness or tingling to her extremities. She was having right upper treatment tingling and numbness prior to surgery which has resolved, her headaches have not.  Past Medical History  Diagnosis Date  . Hydrocephalus   . Bell's palsy   . History of Chiari malformation    Past Surgical History  Procedure Laterality Date  . Shunt replacement    . Brain surgery     History reviewed. No pertinent family history. History  Substance Use Topics  . Smoking status: Current Every Day Smoker -- 0.50 packs/day    Types: Cigarettes  . Smokeless tobacco: Not on file  . Alcohol Use: No   OB History   Grav Para Term Preterm Abortions TAB SAB Ect Mult Living   0              Review of Systems  Constitutional: Negative for fever, chills, diaphoresis, appetite change and fatigue.  HENT: Negative for mouth sores, sore throat and trouble swallowing.   Eyes: Negative for visual disturbance.  Respiratory: Negative for cough, chest tightness, shortness of breath and wheezing.   Cardiovascular: Negative for chest pain.  Gastrointestinal: Negative for nausea, vomiting, abdominal pain, diarrhea  and abdominal distention.  Endocrine: Negative for polydipsia, polyphagia and polyuria.  Genitourinary: Negative for dysuria, frequency and hematuria.  Musculoskeletal: Positive for neck pain. Negative for gait problem.  Skin: Negative for color change, pallor and rash.  Neurological: Positive for headaches. Negative for dizziness, syncope and light-headedness.  Hematological: Does not bruise/bleed easily.  Psychiatric/Behavioral: Negative for behavioral problems and confusion.      Allergies  Penicillins and Tylenol  Home Medications   Current Outpatient Rx  Name  Route  Sig  Dispense  Refill  . ibuprofen (ADVIL,MOTRIN) 200 MG tablet   Oral   Take 400-600 mg by mouth every 6 (six) hours as needed (pain).          Marland Kitchen gabapentin (NEURONTIN) 300 MG capsule      1 po tid for 7 days, then 2 po tid if pain not improving.   90 capsule   0   . methocarbamol (ROBAXIN) 500 MG tablet   Oral   Take 1 tablet (500 mg total) by mouth 2 (two) times daily.   20 tablet   0   . oxycodone (OXY-IR) 5 MG capsule   Oral   Take 1 capsule (5 mg total) by mouth every 4 (four) hours as needed.   10 capsule   0    BP 112/57  Pulse 70  Temp(Src) 98.3 F (36.8 C) (Oral)  Resp 16  SpO2 100%  LMP 03/20/2013 Physical  Exam  Constitutional: She is oriented to person, place, and time. She appears well-developed and well-nourished. No distress.  HENT:  Head: Normocephalic.  Eyes: Conjunctivae are normal. Pupils are equal, round, and reactive to light. No scleral icterus.  Neck: Normal range of motion. Neck supple. No thyromegaly present.    Cardiovascular: Normal rate and regular rhythm.  Exam reveals no gallop and no friction rub.   No murmur heard. Pulmonary/Chest: Effort normal and breath sounds normal. No respiratory distress. She has no wheezes. She has no rales.  Abdominal: Soft. Bowel sounds are normal. She exhibits no distension. There is no tenderness. There is no rebound.    Musculoskeletal: Normal range of motion.  Neurological: She is alert and oriented to person, place, and time.  Normal extremity neurological exam. Normal cranial nerve exam.  Skin: Skin is warm and dry. No rash noted.  Psychiatric: She has a normal mood and affect. Her behavior is normal.    ED Course  Procedures (including critical care time) Labs Review Labs Reviewed - No data to display Imaging Review No results found.   EKG Interpretation None      MDM   Final diagnoses:  Neck pain    Patient with chronic pain. No acute abnormalities or concerning new symptoms. Given prescription for Robaxin and gabapentin. Limited number of 10 hydrocodone for pain.    Rolland PorterMark Lavan Imes, MD 04/22/13 1538

## 2013-04-17 NOTE — Discharge Instructions (Signed)
Musculoskeletal Pain °Musculoskeletal pain is muscle and boney aches and pains. These pains can occur in any part of the body. Your caregiver may treat you without knowing the cause of the pain. They may treat you if blood or urine tests, X-rays, and other tests were normal.  °CAUSES °There is often not a definite cause or reason for these pains. These pains may be caused by a type of germ (virus). The discomfort may also come from overuse. Overuse includes working out too hard when your body is not fit. Boney aches also come from weather changes. Bone is sensitive to atmospheric pressure changes. °HOME CARE INSTRUCTIONS  °· Ask when your test results will be ready. Make sure you get your test results. °· Only take over-the-counter or prescription medicines for pain, discomfort, or fever as directed by your caregiver. If you were given medications for your condition, do not drive, operate machinery or power tools, or sign legal documents for 24 hours. Do not drink alcohol. Do not take sleeping pills or other medications that may interfere with treatment. °· Continue all activities unless the activities cause more pain. When the pain lessens, slowly resume normal activities. Gradually increase the intensity and duration of the activities or exercise. °· During periods of severe pain, bed rest may be helpful. Lay or sit in any position that is comfortable. °· Putting ice on the injured area. °· Put ice in a bag. °· Place a towel between your skin and the bag. °· Leave the ice on for 15 to 20 minutes, 3 to 4 times a day. °· Follow up with your caregiver for continued problems and no reason can be found for the pain. If the pain becomes worse or does not go away, it may be necessary to repeat tests or do additional testing. Your caregiver may need to look further for a possible cause. °SEEK IMMEDIATE MEDICAL CARE IF: °· You have pain that is getting worse and is not relieved by medications. °· You develop chest pain  that is associated with shortness or breath, sweating, feeling sick to your stomach (nauseous), or throw up (vomit). °· Your pain becomes localized to the abdomen. °· You develop any new symptoms that seem different or that concern you. °MAKE SURE YOU:  °· Understand these instructions. °· Will watch your condition. °· Will get help right away if you are not doing well or get worse. °Document Released: 01/06/2005 Document Revised: 03/31/2011 Document Reviewed: 09/10/2012 °[employer]® Patient Information ©2014 [employer]. ° °

## 2013-04-17 NOTE — ED Notes (Signed)
Pt reports having surgery in feb and having neck pain since which causes migraines. Unable to get appt till mid April and having severe pain. Ambulatory at triage.

## 2013-05-17 ENCOUNTER — Observation Stay (HOSPITAL_COMMUNITY)
Admission: AD | Admit: 2013-05-17 | Discharge: 2013-05-19 | Disposition: A | Discharge status: Home / Self Care | Payer: Medicaid Other | Source: Ambulatory Visit | Attending: General Surgery | Admitting: General Surgery

## 2013-05-17 ENCOUNTER — Encounter (HOSPITAL_COMMUNITY): Payer: Self-pay | Admitting: *Deleted

## 2013-05-17 DIAGNOSIS — R1084 Generalized abdominal pain: Secondary | ICD-10-CM

## 2013-05-17 DIAGNOSIS — F17200 Nicotine dependence, unspecified, uncomplicated: Secondary | ICD-10-CM | POA: Insufficient documentation

## 2013-05-17 DIAGNOSIS — G911 Obstructive hydrocephalus: Secondary | ICD-10-CM | POA: Insufficient documentation

## 2013-05-17 DIAGNOSIS — Z87728 Personal history of other specified (corrected) congenital malformations of nervous system and sense organs: Secondary | ICD-10-CM | POA: Insufficient documentation

## 2013-05-17 DIAGNOSIS — K660 Peritoneal adhesions (postprocedural) (postinfection): Secondary | ICD-10-CM | POA: Insufficient documentation

## 2013-05-17 DIAGNOSIS — Z982 Presence of cerebrospinal fluid drainage device: Secondary | ICD-10-CM | POA: Insufficient documentation

## 2013-05-17 DIAGNOSIS — D72829 Elevated white blood cell count, unspecified: Secondary | ICD-10-CM

## 2013-05-17 DIAGNOSIS — G510 Bell's palsy: Secondary | ICD-10-CM | POA: Insufficient documentation

## 2013-05-17 DIAGNOSIS — G43909 Migraine, unspecified, not intractable, without status migrainosus: Secondary | ICD-10-CM | POA: Insufficient documentation

## 2013-05-17 DIAGNOSIS — K3580 Unspecified acute appendicitis: Principal | ICD-10-CM | POA: Diagnosis present

## 2013-05-17 LAB — DIFFERENTIAL
Basophils Absolute: 0.1 10*3/uL (ref 0.0–0.1)
Basophils Relative: 0 % (ref 0–1)
EOS PCT: 2 % (ref 0–5)
Eosinophils Absolute: 0.4 10*3/uL (ref 0.0–0.7)
LYMPHS ABS: 4.6 10*3/uL — AB (ref 0.7–4.0)
LYMPHS PCT: 27 % (ref 12–46)
MONO ABS: 1.4 10*3/uL — AB (ref 0.1–1.0)
MONOS PCT: 8 % (ref 3–12)
NEUTROS ABS: 10.9 10*3/uL — AB (ref 1.7–7.7)
Neutrophils Relative %: 63 % (ref 43–77)

## 2013-05-17 LAB — COMPREHENSIVE METABOLIC PANEL
ALK PHOS: 104 U/L (ref 39–117)
ALT: 23 U/L (ref 0–35)
AST: 15 U/L (ref 0–37)
Albumin: 4.2 g/dL (ref 3.5–5.2)
BILIRUBIN TOTAL: 0.7 mg/dL (ref 0.3–1.2)
BUN: 10 mg/dL (ref 6–23)
CHLORIDE: 95 meq/L — AB (ref 96–112)
CO2: 28 mEq/L (ref 19–32)
Calcium: 9.6 mg/dL (ref 8.4–10.5)
Creatinine, Ser: 0.75 mg/dL (ref 0.50–1.10)
GLUCOSE: 85 mg/dL (ref 70–99)
POTASSIUM: 4.7 meq/L (ref 3.7–5.3)
Sodium: 137 mEq/L (ref 137–147)
Total Protein: 7.4 g/dL (ref 6.0–8.3)

## 2013-05-17 LAB — URINALYSIS, ROUTINE W REFLEX MICROSCOPIC
BILIRUBIN URINE: NEGATIVE
Glucose, UA: NEGATIVE mg/dL
HGB URINE DIPSTICK: NEGATIVE
Ketones, ur: NEGATIVE mg/dL
Leukocytes, UA: NEGATIVE
Nitrite: NEGATIVE
Protein, ur: NEGATIVE mg/dL
SPECIFIC GRAVITY, URINE: 1.01 (ref 1.005–1.030)
UROBILINOGEN UA: 0.2 mg/dL (ref 0.0–1.0)
pH: 5.5 (ref 5.0–8.0)

## 2013-05-17 LAB — CBC
HEMATOCRIT: 44 % (ref 36.0–46.0)
HEMOGLOBIN: 15.5 g/dL — AB (ref 12.0–15.0)
MCH: 29.8 pg (ref 26.0–34.0)
MCHC: 35.2 g/dL (ref 30.0–36.0)
MCV: 84.6 fL (ref 78.0–100.0)
Platelets: 274 10*3/uL (ref 150–400)
RBC: 5.2 MIL/uL — ABNORMAL HIGH (ref 3.87–5.11)
RDW: 12.6 % (ref 11.5–15.5)
WBC: 17.2 10*3/uL — AB (ref 4.0–10.5)

## 2013-05-17 LAB — POCT PREGNANCY, URINE: Preg Test, Ur: NEGATIVE

## 2013-05-17 MED ORDER — KETOROLAC TROMETHAMINE 60 MG/2ML IM SOLN
60.0000 mg | Freq: Once | INTRAMUSCULAR | Status: AC
  Administered 2013-05-18: 60 mg via INTRAMUSCULAR
  Filled 2013-05-17: qty 2

## 2013-05-17 NOTE — MAU Provider Note (Signed)
Chief Complaint: Abdominal Pain  First Provider Initiated Contact with Patient 05/17/13 2323     SUBJECTIVE HPI: Maria Ellis is a 28 y.o. G0P0 female who presents with abd pain x 24 hours. Started as upper abd pain and moved down to low abd pain. No relief w/ Ibuprofen and Robaxin. Rates pain 10/10 on pain scale. Describes pain as constant, sharp, severe. There are no aggravating or alleviating factors. Denies fever, chills, N/V/D/C. Normal daily BMs except for firmer stools a few days ago. Took laxative yesterday when pain began in case constipation was causing her pain. Good results from the laxative, but no relief in pain. Has history of pain in the same location, but mild. Has a VP shunt for hydrocephalus.  Pt LMP 03/23/2013, with hx irreg periods. Patient states she has been in a mutually monogamous relationship with the same partner for 3 years. No history of STDs or PID. Negative STD testing and this relationship.  Past Medical History  Diagnosis Date  . Hydrocephalus   . Bell's palsy   . History of Chiari malformation    OB History  Gravida Para Term Preterm AB SAB TAB Ectopic Multiple Living  1    1 1         # Outcome Date GA Lbr Len/2nd Weight Sex Delivery Anes PTL Lv  1 SAB              Past Surgical History  Procedure Laterality Date  . Shunt replacement    . Brain surgery     History   Social History  . Marital Status: Single    Spouse Name: N/A    Number of Children: N/A  . Years of Education: N/A   Occupational History  . Not on file.   Social History Main Topics  . Smoking status: Current Every Day Smoker -- 0.50 packs/day    Types: Cigarettes  . Smokeless tobacco: Not on file  . Alcohol Use: No  . Drug Use: No  . Sexual Activity: Yes    Birth Control/ Protection: None   Other Topics Concern  . Not on file   Social History Narrative  . No narrative on file   No current facility-administered medications on file prior to encounter.   Current  Outpatient Prescriptions on File Prior to Encounter  Medication Sig Dispense Refill  . gabapentin (NEURONTIN) 300 MG capsule 1 po tid for 7 days, then 2 po tid if pain not improving.  90 capsule  0  . ibuprofen (ADVIL,MOTRIN) 200 MG tablet Take 400-600 mg by mouth every 6 (six) hours as needed (pain).       . methocarbamol (ROBAXIN) 500 MG tablet Take 1 tablet (500 mg total) by mouth 2 (two) times daily.  20 tablet  0  . oxycodone (OXY-IR) 5 MG capsule Take 1 capsule (5 mg total) by mouth every 4 (four) hours as needed.  10 capsule  0   Allergies  Allergen Reactions  . Penicillins Swelling  . Tylenol [Acetaminophen] Rash    ROS: Pertinent items in HPI.   OBJECTIVE Blood pressure 113/73, pulse 93, temperature 99 F (37.2 C), temperature source Oral, resp. rate 20, height 5\' 2"  (1.575 m), weight 78.382 kg (172 lb 12.8 oz), last menstrual period 03/20/2013. Patient Vitals for the past 24 hrs:  BP Temp Temp src Pulse Resp SpO2 Height Weight  05/17/13 2257 113/73 mmHg 99 F (37.2 C) Oral 93 20 - - -  05/17/13 1952 105/72 mmHg 99.6 F (37.6  C) - 121 18 - 5\' 2"  (1.575 m) 78.382 kg (172 lb 12.8 oz)   GENERAL: Well-developed, well-nourished female in moderate distress. Tearful. HEENT: Normocephalic HEART: normal rate and rhythm. No murmurs rubs or gallops. RESP: normal effort. Clear to auscultation bilaterally. ABDOMEN: Mildly distended, moderate generalized tenderness throughout upper abdomen and severe generalized tenderness throughout entire lower abdomen, greatest in suprapubic area. Decreased bowel sounds in all 4 quadrants. Negative mass or rebound. No CVA tenderness. EXTREMITIES: Nontender, no edema NEURO: Alert and oriented SPECULUM EXAM: NEFG, physiologic discharge, no blood noted, cervix clean BIMANUAL: cervix closed; uterus normal size, mild right adnexal tenderness. No mass. No left adnexal tenderness or mass. No cervical motion tenderness.  LAB RESULTS Results for orders  placed during the hospital encounter of 05/17/13 (from the past 24 hour(s))  URINALYSIS, ROUTINE W REFLEX MICROSCOPIC     Status: None   Collection Time    05/17/13  8:00 PM      Result Value Ref Range   Color, Urine YELLOW  YELLOW   APPearance CLEAR  CLEAR   Specific Gravity, Urine 1.010  1.005 - 1.030   pH 5.5  5.0 - 8.0   Glucose, UA NEGATIVE  NEGATIVE mg/dL   Hgb urine dipstick NEGATIVE  NEGATIVE   Bilirubin Urine NEGATIVE  NEGATIVE   Ketones, ur NEGATIVE  NEGATIVE mg/dL   Protein, ur NEGATIVE  NEGATIVE mg/dL   Urobilinogen, UA 0.2  0.0 - 1.0 mg/dL   Nitrite NEGATIVE  NEGATIVE   Leukocytes, UA NEGATIVE  NEGATIVE  POCT PREGNANCY, URINE     Status: None   Collection Time    05/17/13  8:12 PM      Result Value Ref Range   Preg Test, Ur NEGATIVE  NEGATIVE  CBC     Status: Abnormal   Collection Time    05/17/13  8:52 PM      Result Value Ref Range   WBC 17.2 (*) 4.0 - 10.5 K/uL   RBC 5.20 (*) 3.87 - 5.11 MIL/uL   Hemoglobin 15.5 (*) 12.0 - 15.0 g/dL   HCT 16.144.0  09.636.0 - 04.546.0 %   MCV 84.6  78.0 - 100.0 fL   MCH 29.8  26.0 - 34.0 pg   MCHC 35.2  30.0 - 36.0 g/dL   RDW 40.912.6  81.111.5 - 91.415.5 %   Platelets 274  150 - 400 K/uL  COMPREHENSIVE METABOLIC PANEL     Status: Abnormal   Collection Time    05/17/13  8:52 PM      Result Value Ref Range   Sodium 137  137 - 147 mEq/L   Potassium 4.7  3.7 - 5.3 mEq/L   Chloride 95 (*) 96 - 112 mEq/L   CO2 28  19 - 32 mEq/L   Glucose, Bld 85  70 - 99 mg/dL   BUN 10  6 - 23 mg/dL   Creatinine, Ser 7.820.75  0.50 - 1.10 mg/dL   Calcium 9.6  8.4 - 95.610.5 mg/dL   Total Protein 7.4  6.0 - 8.3 g/dL   Albumin 4.2  3.5 - 5.2 g/dL   AST 15  0 - 37 U/L   ALT 23  0 - 35 U/L   Alkaline Phosphatase 104  39 - 117 U/L   Total Bilirubin 0.7  0.3 - 1.2 mg/dL   GFR calc non Af Amer >90  >90 mL/min   GFR calc Af Amer >90  >90 mL/min  DIFFERENTIAL     Status: Abnormal  Collection Time    05/17/13  8:52 PM      Result Value Ref Range   Neutrophils Relative %  63  43 - 77 %   Neutro Abs 10.9 (*) 1.7 - 7.7 K/uL   Lymphocytes Relative 27  12 - 46 %   Lymphs Abs 4.6 (*) 0.7 - 4.0 K/uL   Monocytes Relative 8  3 - 12 %   Monocytes Absolute 1.4 (*) 0.1 - 1.0 K/uL   Eosinophils Relative 2  0 - 5 %   Eosinophils Absolute 0.4  0.0 - 0.7 K/uL   Basophils Relative 0  0 - 1 %   Basophils Absolute 0.1  0.0 - 0.1 K/uL  WET PREP, GENITAL     Status: Abnormal   Collection Time    05/18/13 12:05 AM      Result Value Ref Range   Yeast Wet Prep HPF POC NONE SEEN  NONE SEEN   Trich, Wet Prep NONE SEEN  NONE SEEN   Clue Cells Wet Prep HPF POC NONE SEEN  NONE SEEN   WBC, Wet Prep HPF POC FEW (*) NONE SEEN    IMAGING US Transvaginal Non-ob  05/18/2013   CLINICAL DATA:  Lower abdominal pain.  EXAM: TRANSABDOMINAL AND TRANSVAGINAL ULTRASOUND OF PELVIS  TECHNIQUE: Both transabdominal and transvaginal ultrasound examinations of the pelvis were performed. Transabdominal technique was performed for global imaging of the pelvis including uterus, ovaries, adnexal regions, and pelvic cul-de-sac. It was necessary to proceed with endovaginal exam following the transabdominal exam to visualize the uterus and ovaries in greater detail.  COMPARISON:  Pelvic ultrasound, and CT of the abdomen and pelvis, performed 03/11/2013  FINDINGS: Uterus  Measurements: 6.6 x 3.1 x 3.8 cm. No fibroids or other mass visualized.  Endometrium  Thickness: 0.6 cm.  No focal abnormality visualized.  Right ovary  Measurements: 4.8 x 3.0 x 2.3 cm. Normal appearance/no adnexal mass.  Left ovary  Measurements: 3.2 x 2.2 x 2.7 cm. Normal appearance/no adnexal mass.  Other findings  No free fluid is seen within the pelvic cul-de-sac.  IMPRESSION: Unremarkable pelvic ultrasound.   Electronically Signed   By: Roanna Raider M.D.   On: 05/18/2013 00:57   US Pelvis Complete  05/18/2013   CLINICAL DATA:  Lower abdominal pain.  EXAM: TRANSABDOMINAL AND TRANSVAGINAL ULTRASOUND OF PELVIS  TECHNIQUE: Both  transabdominal and transvaginal ultrasound examinations of the pelvis were performed. Transabdominal technique was performed for global imaging of the pelvis including uterus, ovaries, adnexal regions, and pelvic cul-de-sac. It was necessary to proceed with endovaginal exam following the transabdominal exam to visualize the uterus and ovaries in greater detail.  COMPARISON:  Pelvic ultrasound, and CT of the abdomen and pelvis, performed 03/11/2013  FINDINGS: Uterus  Measurements: 6.6 x 3.1 x 3.8 cm. No fibroids or other mass visualized.  Endometrium  Thickness: 0.6 cm.  No focal abnormality visualized.  Right ovary  Measurements: 4.8 x 3.0 x 2.3 cm. Normal appearance/no adnexal mass.  Left ovary  Measurements: 3.2 x 2.2 x 2.7 cm. Normal appearance/no adnexal mass.  Other findings  No free fluid is seen within the pelvic cul-de-sac.  IMPRESSION: Unremarkable pelvic ultrasound.   Electronically Signed   By: Roanna Raider M.D.   On: 05/18/2013 00:57   MAU COURSE Toradol given. CBC, pelvic ultrasound ordered.   No relief of pain. Requesting more pain medication. Oxycodone given.  ASSESSMENT 1. Abdominal pain, acute, generalized--no GYN etiology of pain identified   2. Leukocytosis  [facility] for further evaluation per consult with Dr. Erin FullingHarraway-Corran Lalone.  [place] Myla Mauriello, [place] 05/18/2013  2:38 AM

## 2013-05-17 NOTE — MAU Note (Signed)
Pt LMP 03/23/2013, hx irreg periods, upper abd pain since last night that has moved to lower abd pain.  Pt took a laxitive last night, was able to have a bowel movement.  The pain did not go away.

## 2013-05-18 ENCOUNTER — Inpatient Hospital Stay (HOSPITAL_COMMUNITY): Payer: Medicaid Other

## 2013-05-18 ENCOUNTER — Observation Stay (HOSPITAL_COMMUNITY): Payer: Medicaid Other | Admitting: Anesthesiology

## 2013-05-18 ENCOUNTER — Encounter (HOSPITAL_COMMUNITY)
Admission: AD | Disposition: A | Discharge status: Home / Self Care | Payer: Self-pay | Source: Ambulatory Visit | Attending: Emergency Medicine

## 2013-05-18 ENCOUNTER — Encounter (HOSPITAL_COMMUNITY): Payer: Medicaid Other | Admitting: Anesthesiology

## 2013-05-18 ENCOUNTER — Encounter (HOSPITAL_COMMUNITY): Payer: Self-pay

## 2013-05-18 DIAGNOSIS — K3580 Unspecified acute appendicitis: Secondary | ICD-10-CM

## 2013-05-18 DIAGNOSIS — R1084 Generalized abdominal pain: Secondary | ICD-10-CM

## 2013-05-18 DIAGNOSIS — R52 Pain, unspecified: Secondary | ICD-10-CM

## 2013-05-18 HISTORY — PX: LAPAROSCOPIC APPENDECTOMY: SHX408

## 2013-05-18 HISTORY — DX: Unspecified acute appendicitis: K35.80

## 2013-05-18 LAB — WET PREP, GENITAL
Clue Cells Wet Prep HPF POC: NONE SEEN
TRICH WET PREP: NONE SEEN
YEAST WET PREP: NONE SEEN

## 2013-05-18 LAB — GC/CHLAMYDIA PROBE AMP
CT Probe RNA: NEGATIVE
GC Probe RNA: NEGATIVE

## 2013-05-18 SURGERY — APPENDECTOMY, LAPAROSCOPIC
Anesthesia: General | Site: Abdomen | Laterality: Left

## 2013-05-18 MED ORDER — KCL IN DEXTROSE-NACL 30-5-0.45 MEQ/L-%-% IV SOLN
INTRAVENOUS | Status: DC
  Administered 2013-05-18: 16:00:00 via INTRAVENOUS
  Filled 2013-05-18 (×4): qty 1000

## 2013-05-18 MED ORDER — HYDROCODONE-ACETAMINOPHEN 5-325 MG PO TABS: 1.0000 | ORAL_TABLET | ORAL | Status: DC | PRN

## 2013-05-18 MED ORDER — ONDANSETRON HCL 4 MG PO TABS: 4.0000 mg | ORAL_TABLET | Freq: Four times a day (QID) | ORAL | Status: DC | PRN

## 2013-05-18 MED ORDER — LIDOCAINE HCL (CARDIAC) 20 MG/ML IV SOLN
INTRAVENOUS | Status: AC
  Filled 2013-05-18: qty 5

## 2013-05-18 MED ORDER — NEOSTIGMINE METHYLSULFATE 1 MG/ML IJ SOLN
INTRAMUSCULAR | Status: AC
  Filled 2013-05-18: qty 10

## 2013-05-18 MED ORDER — LACTATED RINGERS IV SOLN: INTRAVENOUS | Status: DC

## 2013-05-18 MED ORDER — ONDANSETRON HCL 4 MG/2ML IJ SOLN
INTRAMUSCULAR | Status: DC | PRN
  Administered 2013-05-18: 4 mg via INTRAVENOUS

## 2013-05-18 MED ORDER — BUPIVACAINE-EPINEPHRINE 0.25% -1:200000 IJ SOLN
INTRAMUSCULAR | Status: AC
  Filled 2013-05-18: qty 1

## 2013-05-18 MED ORDER — GABAPENTIN 300 MG PO CAPS
300.0000 mg | ORAL_CAPSULE | Freq: Three times a day (TID) | ORAL | Status: DC
  Administered 2013-05-18 – 2013-05-19 (×3): 300 mg via ORAL
  Filled 2013-05-18 (×8): qty 1

## 2013-05-18 MED ORDER — ROCURONIUM BROMIDE 100 MG/10ML IV SOLN
INTRAVENOUS | Status: DC | PRN
  Administered 2013-05-18: 20 mg via INTRAVENOUS
  Administered 2013-05-18: 10 mg via INTRAVENOUS

## 2013-05-18 MED ORDER — LIDOCAINE HCL (CARDIAC) 20 MG/ML IV SOLN
INTRAVENOUS | Status: DC | PRN
  Administered 2013-05-18: 40 mg via INTRAVENOUS

## 2013-05-18 MED ORDER — MEPERIDINE HCL 50 MG/ML IJ SOLN: 6.2500 mg | INTRAMUSCULAR | Status: DC | PRN

## 2013-05-18 MED ORDER — HYDROMORPHONE HCL PF 1 MG/ML IJ SOLN
INTRAMUSCULAR | Status: AC
  Filled 2013-05-18: qty 1

## 2013-05-18 MED ORDER — DEXTROSE 5 % IV SOLN
1.0000 g | Freq: Once | INTRAVENOUS | Status: AC
  Administered 2013-05-18: 1 g via INTRAVENOUS
  Filled 2013-05-18: qty 10

## 2013-05-18 MED ORDER — SUCCINYLCHOLINE CHLORIDE 20 MG/ML IJ SOLN
INTRAMUSCULAR | Status: DC | PRN
  Administered 2013-05-18: 100 mg via INTRAVENOUS

## 2013-05-18 MED ORDER — FENTANYL CITRATE 0.05 MG/ML IJ SOLN
INTRAMUSCULAR | Status: DC | PRN
  Administered 2013-05-18 (×5): 50 ug via INTRAVENOUS

## 2013-05-18 MED ORDER — HYDROMORPHONE HCL PF 1 MG/ML IJ SOLN
1.0000 mg | Freq: Once | INTRAMUSCULAR | Status: AC
  Administered 2013-05-18: 1 mg via INTRAVENOUS
  Filled 2013-05-18: qty 1

## 2013-05-18 MED ORDER — PROPOFOL 10 MG/ML IV BOLUS
INTRAVENOUS | Status: AC
  Filled 2013-05-18: qty 20

## 2013-05-18 MED ORDER — ONDANSETRON HCL 4 MG/2ML IJ SOLN
4.0000 mg | Freq: Once | INTRAMUSCULAR | Status: AC
  Administered 2013-05-18: 4 mg via INTRAVENOUS
  Filled 2013-05-18: qty 2

## 2013-05-18 MED ORDER — FENTANYL CITRATE 0.05 MG/ML IJ SOLN
50.0000 ug | Freq: Once | INTRAMUSCULAR | Status: AC
  Administered 2013-05-18: 50 ug via INTRAVENOUS
  Filled 2013-05-18: qty 2

## 2013-05-18 MED ORDER — ONDANSETRON HCL 4 MG/2ML IJ SOLN: 4.0000 mg | Freq: Four times a day (QID) | INTRAMUSCULAR | Status: DC | PRN

## 2013-05-18 MED ORDER — DEXAMETHASONE SODIUM PHOSPHATE 10 MG/ML IJ SOLN
INTRAMUSCULAR | Status: DC | PRN
  Administered 2013-05-18: 10 mg via INTRAVENOUS

## 2013-05-18 MED ORDER — IOHEXOL 300 MG/ML  SOLN
50.0000 mL | Freq: Once | INTRAMUSCULAR | Status: AC | PRN
  Administered 2013-05-18: 50 mL via ORAL

## 2013-05-18 MED ORDER — SODIUM CHLORIDE 0.9 % IV SOLN
Freq: Once | INTRAVENOUS | Status: AC
  Administered 2013-05-18: 06:00:00 via INTRAVENOUS

## 2013-05-18 MED ORDER — HYDROMORPHONE HCL PF 1 MG/ML IJ SOLN
0.2500 mg | INTRAMUSCULAR | Status: DC | PRN
  Administered 2013-05-18 (×5): 0.25 mg via INTRAVENOUS

## 2013-05-18 MED ORDER — GLYCOPYRROLATE 0.2 MG/ML IJ SOLN
INTRAMUSCULAR | Status: DC | PRN
Start: 2013-05-18 — End: 2013-05-18
  Administered 2013-05-18: .6 mg via INTRAVENOUS

## 2013-05-18 MED ORDER — PROMETHAZINE HCL 25 MG/ML IJ SOLN: 6.2500 mg | INTRAMUSCULAR | Status: DC | PRN

## 2013-05-18 MED ORDER — SODIUM CHLORIDE 0.9 % IV SOLN: 3.0000 g | Freq: Four times a day (QID) | INTRAVENOUS | Status: DC

## 2013-05-18 MED ORDER — LACTATED RINGERS IV SOLN
INTRAVENOUS | Status: DC
  Administered 2013-05-18: 1000 mL via INTRAVENOUS

## 2013-05-18 MED ORDER — OXYCODONE HCL 5 MG PO TABS
5.0000 mg | ORAL_TABLET | ORAL | Status: DC | PRN
  Administered 2013-05-18 (×2): 5 mg via ORAL
  Administered 2013-05-19 (×2): 10 mg via ORAL
  Filled 2013-05-18 (×3): qty 2
  Filled 2013-05-18 (×2): qty 1

## 2013-05-18 MED ORDER — PROPOFOL 10 MG/ML IV BOLUS
INTRAVENOUS | Status: DC | PRN
  Administered 2013-05-18: 170 mg via INTRAVENOUS

## 2013-05-18 MED ORDER — OXYCODONE HCL 5 MG PO TABS
10.0000 mg | ORAL_TABLET | Freq: Once | ORAL | Status: AC
  Administered 2013-05-18: 10 mg via ORAL
  Filled 2013-05-18: qty 2

## 2013-05-18 MED ORDER — METRONIDAZOLE IN NACL 5-0.79 MG/ML-% IV SOLN
500.0000 mg | Freq: Once | INTRAVENOUS | Status: AC
  Administered 2013-05-18: 500 mg via INTRAVENOUS
  Filled 2013-05-18: qty 100

## 2013-05-18 MED ORDER — FENTANYL CITRATE 0.05 MG/ML IJ SOLN: 25.0000 ug | INTRAMUSCULAR | Status: DC | PRN

## 2013-05-18 MED ORDER — NEOSTIGMINE METHYLSULFATE 1 MG/ML IJ SOLN
INTRAMUSCULAR | Status: DC | PRN
  Administered 2013-05-18: 4 mg via INTRAVENOUS

## 2013-05-18 MED ORDER — GLYCOPYRROLATE 0.2 MG/ML IJ SOLN
INTRAMUSCULAR | Status: AC
  Filled 2013-05-18: qty 3

## 2013-05-18 MED ORDER — FENTANYL CITRATE 0.05 MG/ML IJ SOLN
INTRAMUSCULAR | Status: AC
  Filled 2013-05-18: qty 5

## 2013-05-18 MED ORDER — IOHEXOL 300 MG/ML  SOLN
100.0000 mL | Freq: Once | INTRAMUSCULAR | Status: AC | PRN
  Administered 2013-05-18: 100 mL via INTRAVENOUS

## 2013-05-18 MED ORDER — MIDAZOLAM HCL 5 MG/5ML IJ SOLN
INTRAMUSCULAR | Status: DC | PRN
  Administered 2013-05-18: 2 mg via INTRAVENOUS

## 2013-05-18 MED ORDER — LACTATED RINGERS IV SOLN
INTRAVENOUS | Status: DC | PRN
  Administered 2013-05-18 (×2): via INTRAVENOUS

## 2013-05-18 MED ORDER — IBUPROFEN 600 MG PO TABS
600.0000 mg | ORAL_TABLET | Freq: Four times a day (QID) | ORAL | Status: DC | PRN
  Administered 2013-05-18: 600 mg via ORAL
  Filled 2013-05-18 (×2): qty 1

## 2013-05-18 MED ORDER — MORPHINE SULFATE 2 MG/ML IJ SOLN
1.0000 mg | INTRAMUSCULAR | Status: DC | PRN
  Administered 2013-05-18 (×3): 4 mg via INTRAVENOUS
  Administered 2013-05-19: 2 mg via INTRAVENOUS
  Administered 2013-05-19 (×2): 4 mg via INTRAVENOUS
  Filled 2013-05-18 (×2): qty 2
  Filled 2013-05-18: qty 1
  Filled 2013-05-18 (×2): qty 2

## 2013-05-18 MED ORDER — MIDAZOLAM HCL 2 MG/2ML IJ SOLN
INTRAMUSCULAR | Status: AC
  Filled 2013-05-18: qty 2

## 2013-05-18 MED ORDER — MORPHINE SULFATE 2 MG/ML IJ SOLN
1.0000 mg | INTRAMUSCULAR | Status: DC | PRN
  Filled 2013-05-18: qty 2

## 2013-05-18 MED ORDER — FENTANYL CITRATE 0.05 MG/ML IJ SOLN
50.0000 ug | INTRAMUSCULAR | Status: DC | PRN
  Administered 2013-05-18 (×2): 50 ug via INTRAVENOUS

## 2013-05-18 MED ORDER — FENTANYL CITRATE 0.05 MG/ML IJ SOLN
INTRAMUSCULAR | Status: AC
  Filled 2013-05-18: qty 2

## 2013-05-18 MED ORDER — METRONIDAZOLE IN NACL 5-0.79 MG/ML-% IV SOLN
500.0000 mg | Freq: Three times a day (TID) | INTRAVENOUS | Status: AC
  Administered 2013-05-18 (×2): 500 mg via INTRAVENOUS
  Filled 2013-05-18 (×2): qty 100

## 2013-05-18 MED ORDER — DEXAMETHASONE SODIUM PHOSPHATE 10 MG/ML IJ SOLN
INTRAMUSCULAR | Status: AC
  Filled 2013-05-18: qty 1

## 2013-05-18 MED ORDER — BUPIVACAINE-EPINEPHRINE 0.25% -1:200000 IJ SOLN
INTRAMUSCULAR | Status: DC | PRN
  Administered 2013-05-18 (×2): 10 mL

## 2013-05-18 MED ORDER — METHOCARBAMOL 500 MG PO TABS
500.0000 mg | ORAL_TABLET | Freq: Two times a day (BID) | ORAL | Status: DC
  Administered 2013-05-18 – 2013-05-19 (×2): 500 mg via ORAL
  Filled 2013-05-18 (×3): qty 1

## 2013-05-18 SURGICAL SUPPLY — 40 items
APL SKNCLS STERI-STRIP NONHPOA (GAUZE/BANDAGES/DRESSINGS) ×1
APPLIER CLIP ROT 10 11.4 M/L (STAPLE)
APR CLP MED LRG 11.4X10 (STAPLE)
BAG SPEC RTRVL LRG 6X4 10 (ENDOMECHANICALS) ×1
BENZOIN TINCTURE PRP APPL 2/3 (GAUZE/BANDAGES/DRESSINGS) ×3 IMPLANT
CANISTER SUCTION 2500CC (MISCELLANEOUS) ×3 IMPLANT
CLIP APPLIE ROT 10 11.4 M/L (STAPLE) IMPLANT
CLOSURE WOUND 1/2 X4 (GAUZE/BANDAGES/DRESSINGS) ×1
CUTTER FLEX LINEAR 45M (STAPLE) ×2 IMPLANT
DECANTER SPIKE VIAL GLASS SM (MISCELLANEOUS) ×3 IMPLANT
DRAPE LAPAROSCOPIC ABDOMINAL (DRAPES) ×3 IMPLANT
ELECT REM PT RETURN 9FT ADLT (ELECTROSURGICAL) ×3
ELECTRODE REM PT RTRN 9FT ADLT (ELECTROSURGICAL) ×1 IMPLANT
ENDOLOOP SUT PDS II  0 18 (SUTURE)
ENDOLOOP SUT PDS II 0 18 (SUTURE) IMPLANT
GLOVE BIOGEL PI IND STRL 7.0 (GLOVE) ×1 IMPLANT
GLOVE BIOGEL PI INDICATOR 7.0 (GLOVE) ×4
GLOVE SURG ORTHO 8.0 STRL STRW (GLOVE) ×3 IMPLANT
GOWN STRL REUS W/TWL LRG LVL3 (GOWN DISPOSABLE) ×1 IMPLANT
GOWN STRL REUS W/TWL XL LVL3 (GOWN DISPOSABLE) ×10 IMPLANT
KIT BASIN OR (CUSTOM PROCEDURE TRAY) ×3 IMPLANT
PENCIL BUTTON HOLSTER BLD 10FT (ELECTRODE) IMPLANT
POUCH SPECIMEN RETRIEVAL 10MM (ENDOMECHANICALS) ×2 IMPLANT
RELOAD 45 VASCULAR/THIN (ENDOMECHANICALS) IMPLANT
RELOAD STAPLE 45 2.5 WHT GRN (ENDOMECHANICALS) IMPLANT
RELOAD STAPLE 45 3.5 BLU ETS (ENDOMECHANICALS) IMPLANT
RELOAD STAPLE TA45 3.5 REG BLU (ENDOMECHANICALS) ×3 IMPLANT
SCALPEL HARMONIC ACE (MISCELLANEOUS) ×2 IMPLANT
SET IRRIG TUBING LAPAROSCOPIC (IRRIGATION / IRRIGATOR) ×2 IMPLANT
SOLUTION ANTI FOG 6CC (MISCELLANEOUS) ×3 IMPLANT
STRIP CLOSURE SKIN 1/2X4 (GAUZE/BANDAGES/DRESSINGS) ×2 IMPLANT
SUT MNCRL AB 4-0 PS2 18 (SUTURE) ×3 IMPLANT
TOWEL OR 17X26 10 PK STRL BLUE (TOWEL DISPOSABLE) ×3 IMPLANT
TRAY FOLEY CATH 14FRSI W/METER (CATHETERS) ×3 IMPLANT
TRAY LAP CHOLE (CUSTOM PROCEDURE TRAY) ×3 IMPLANT
TROCAR BLADELESS OPT 5 75 (ENDOMECHANICALS) ×3 IMPLANT
TROCAR XCEL BLUNT TIP 100MML (ENDOMECHANICALS) ×3 IMPLANT
TROCAR XCEL NON-BLD 11X100MML (ENDOMECHANICALS) ×3 IMPLANT
TUBING INSUFFLATION 10FT LAP (TUBING) ×3 IMPLANT
WATER STERILE IRR 1500ML POUR (IV SOLUTION) ×3 IMPLANT

## 2013-05-18 NOTE — ED Provider Notes (Signed)
Medical screening examination/treatment/procedure(s) were performed by non-physician practitioner and as supervising physician I was immediately available for consultation/collaboration.   EKG Interpretation None        Loren Raceravid Beryle Bagsby, MD 05/18/13 [phone]

## 2013-05-18 NOTE — H&P (Signed)
Maria Ellis 24-Nov-1985  [id number].   Chief Complaint/Reason for Consult: acute appendicitis HPI: this is a 28 yo female who had a h/o Chiari malformation with a VP shunt.  She began having upper abdominal pain on Monday.  Her pain progressed and migrated to lower mid abdomen.  It radiated some to the left and right lower quadrants.  She denies fevers, chills, nausea, or vomiting.  She presented to [facility] last night for evaluation.  After a workup, she was found to have appendicitis on her CT scan.  We have been asked to evaluate the patient for admission.  ROS: Please see HPI, otherwise all other systems have been reviewed and are negative  History reviewed. No pertinent family history.  Past Medical History  Diagnosis Date  . Hydrocephalus   . Bell's palsy   . History of Chiari malformation     Past Surgical History  Procedure Laterality Date  . Shunt replacement    . Brain surgery      Social History:  reports that she has been smoking Cigarettes.  She has been smoking about 0.50 packs per day. She does not have any smokeless tobacco history on file. She reports that she does not drink alcohol or use illicit drugs.  Allergies:  Allergies  Allergen Reactions  . Penicillins Swelling  . Tylenol [Acetaminophen] Rash     (Not in a hospital admission)  Blood pressure 103/73, pulse 67, temperature 97.9 F (36.6 C), temperature source Oral, resp. rate 18, height _0  (1.575 m), weight 172 lb 12.8 oz (78.382 kg), last menstrual period 03/20/2013, SpO2 98.00%. Physical Exam: General: pleasant, WD, WN white female who is laying in bed in NAD HEENT: head is normocephalic, atraumatic.  Sclera are noninjected.  PERRL.  Ears and nose without any masses or lesions.  Mouth is pink and moist Heart: regular, rate, and rhythm.  Normal s1,s2. No obvious murmurs, gallops, or rubs noted.  Palpable radial and pedal pulses bilaterally Lungs: CTAB, no wheezes, rhonchi, or rales noted.  Respiratory  effort nonlabored Abd: soft, tender in lower abdomen, between RLQ and midline, ND, +BS, no masses, hernias, or organomegaly.  She has an upper midline sacr from prior shunt revision MS: all 4 extremities are symmetrical with no cyanosis, clubbing, or edema. Skin: warm and dry with no masses, lesions, or rashes Psych: A&Ox3 with an appropriate affect.    Results for orders placed during the hospital encounter of 05/17/13 (from the past 48 hour(s))  URINALYSIS, ROUTINE W REFLEX MICROSCOPIC     Status: None   Collection Time    05/17/13  8:00 PM      Result Value Ref Range   Color, Urine YELLOW  YELLOW   APPearance CLEAR  CLEAR   Specific Gravity, Urine 1.010  1.005 - 1.030   pH 5.5  5.0 - 8.0   Glucose, UA NEGATIVE  NEGATIVE mg/dL   Hgb urine dipstick NEGATIVE  NEGATIVE   Bilirubin Urine NEGATIVE  NEGATIVE   Ketones, ur NEGATIVE  NEGATIVE mg/dL   Protein, ur NEGATIVE  NEGATIVE mg/dL   Urobilinogen, UA 0.2  0.0 - 1.0 mg/dL   Nitrite NEGATIVE  NEGATIVE   Leukocytes, UA NEGATIVE  NEGATIVE   Comment: MICROSCOPIC NOT DONE ON URINES WITH NEGATIVE PROTEIN, BLOOD, LEUKOCYTES, NITRITE, OR GLUCOSE <1000 mg/dL.  POCT PREGNANCY, URINE     Status: None   Collection Time    05/17/13  8:12 PM      Result Value Ref Range  Preg Test, Ur NEGATIVE  NEGATIVE   Comment:            THE SENSITIVITY OF THIS     METHODOLOGY IS >24 mIU/mL  CBC     Status: Abnormal   Collection Time    05/17/13  8:52 PM      Result Value Ref Range   WBC 17.2 (*) 4.0 - 10.5 K/uL   RBC 5.20 (*) 3.87 - 5.11 MIL/uL   Hemoglobin 15.5 (*) 12.0 - 15.0 g/dL   HCT 44.0  36.0 - 46.0 %   MCV 84.6  78.0 - 100.0 fL   MCH 29.8  26.0 - 34.0 pg   MCHC 35.2  30.0 - 36.0 g/dL   RDW 12.6  11.5 - 15.5 %   Platelets 274  150 - 400 K/uL  COMPREHENSIVE METABOLIC PANEL     Status: Abnormal   Collection Time    05/17/13  8:52 PM      Result Value Ref Range   Sodium 137  137 - 147 mEq/L   Potassium 4.7  3.7 - 5.3 mEq/L   Chloride 95  (*) 96 - 112 mEq/L   CO2 28  19 - 32 mEq/L   Glucose, Bld 85  70 - 99 mg/dL   BUN 10  6 - 23 mg/dL   Creatinine, Ser 0.75  0.50 - 1.10 mg/dL   Calcium 9.6  8.4 - 10.5 mg/dL   Total Protein 7.4  6.0 - 8.3 g/dL   Albumin 4.2  3.5 - 5.2 g/dL   AST 15  0 - 37 U/L   ALT 23  0 - 35 U/L   Alkaline Phosphatase 104  39 - 117 U/L   Total Bilirubin 0.7  0.3 - 1.2 mg/dL   GFR calc non Af Amer >90  >90 mL/min   GFR calc Af Amer >90  >90 mL/min   Comment: (NOTE)     The eGFR has been calculated using the CKD EPI equation.     This calculation has not been validated in all clinical situations.     eGFR's persistently <90 mL/min signify possible Chronic Kidney     Disease.  DIFFERENTIAL     Status: Abnormal   Collection Time    05/17/13  8:52 PM      Result Value Ref Range   Neutrophils Relative % 63  43 - 77 %   Neutro Abs 10.9 (*) 1.7 - 7.7 K/uL   Lymphocytes Relative 27  12 - 46 %   Lymphs Abs 4.6 (*) 0.7 - 4.0 K/uL   Monocytes Relative 8  3 - 12 %   Monocytes Absolute 1.4 (*) 0.1 - 1.0 K/uL   Eosinophils Relative 2  0 - 5 %   Eosinophils Absolute 0.4  0.0 - 0.7 K/uL   Basophils Relative 0  0 - 1 %   Basophils Absolute 0.1  0.0 - 0.1 K/uL  WET PREP, GENITAL     Status: Abnormal   Collection Time    05/18/13 12:05 AM      Result Value Ref Range   Yeast Wet Prep HPF POC NONE SEEN  NONE SEEN   Trich, Wet Prep NONE SEEN  NONE SEEN   Clue Cells Wet Prep HPF POC NONE SEEN  NONE SEEN   WBC, Wet Prep HPF POC FEW (*) NONE SEEN   Comment: FEW BACTERIA SEEN   US Transvaginal Non-ob  05/18/2013   CLINICAL DATA:  Lower abdominal pain.  EXAM:  TRANSABDOMINAL AND TRANSVAGINAL ULTRASOUND OF PELVIS  TECHNIQUE: Both transabdominal and transvaginal ultrasound examinations of the pelvis were performed. Transabdominal technique was performed for global imaging of the pelvis including uterus, ovaries, adnexal regions, and pelvic cul-de-sac. It was necessary to proceed with endovaginal exam following the  transabdominal exam to visualize the uterus and ovaries in greater detail.  COMPARISON:  Pelvic ultrasound, and CT of the abdomen and pelvis, performed 03/11/2013  FINDINGS: Uterus  Measurements: 6.6 x 3.1 x 3.8 cm. No fibroids or other mass visualized.  Endometrium  Thickness: 0.6 cm.  No focal abnormality visualized.  Right ovary  Measurements: 4.8 x 3.0 x 2.3 cm. Normal appearance/no adnexal mass.  Left ovary  Measurements: 3.2 x 2.2 x 2.7 cm. Normal appearance/no adnexal mass.  Other findings  No free fluid is seen within the pelvic cul-de-sac.  IMPRESSION: Unremarkable pelvic ultrasound.   Electronically Signed   By: Garald Balding M.D.   On: 05/18/2013 00:57   US Pelvis Complete  05/18/2013   CLINICAL DATA:  Lower abdominal pain.  EXAM: TRANSABDOMINAL AND TRANSVAGINAL ULTRASOUND OF PELVIS  TECHNIQUE: Both transabdominal and transvaginal ultrasound examinations of the pelvis were performed. Transabdominal technique was performed for global imaging of the pelvis including uterus, ovaries, adnexal regions, and pelvic cul-de-sac. It was necessary to proceed with endovaginal exam following the transabdominal exam to visualize the uterus and ovaries in greater detail.  COMPARISON:  Pelvic ultrasound, and CT of the abdomen and pelvis, performed 03/11/2013  FINDINGS: Uterus  Measurements: 6.6 x 3.1 x 3.8 cm. No fibroids or other mass visualized.  Endometrium  Thickness: 0.6 cm.  No focal abnormality visualized.  Right ovary  Measurements: 4.8 x 3.0 x 2.3 cm. Normal appearance/no adnexal mass.  Left ovary  Measurements: 3.2 x 2.2 x 2.7 cm. Normal appearance/no adnexal mass.  Other findings  No free fluid is seen within the pelvic cul-de-sac.  IMPRESSION: Unremarkable pelvic ultrasound.   Electronically Signed   By: Garald Balding M.D.   On: 05/18/2013 00:57   Ct Abdomen Pelvis W Contrast  05/18/2013   CLINICAL DATA:  Abdominal pain.  Leukocytosis.  EXAM: CT ABDOMEN AND PELVIS WITH CONTRAST  TECHNIQUE:  Multidetector CT imaging of the abdomen and pelvis was performed using the standard protocol following bolus administration of intravenous contrast.  CONTRAST:  166m OMNIPAQUE IOHEXOL 300 MG/ML  SOLN  COMPARISON:  CT of the abdomen and pelvis performed 03/11/2013  FINDINGS: Minimal left basilar atelectasis is noted.  A ventriculoperitoneal shunt is seen coiling about the upper abdomen, ending at the left upper quadrant.  The liver and spleen are unremarkable in appearance. The gallbladder is within normal limits. The pancreas and adrenal glands are unremarkable.  The kidneys are unremarkable in appearance. There is no evidence of hydronephrosis. No renal or ureteral stones are seen. No perinephric stranding is appreciated.  No free fluid is identified. The small bowel is unremarkable in appearance. The stomach is within normal limits. No acute vascular abnormalities are seen.  The appendix is dilated to 9 mm in maximal diameter distally, with associated soft tissue inflammation and trace fluid, compatible with acute appendicitis. This is best characterized on coronal images. There is no evidence of perforation or abscess formation at this time. Prominent pericecal nodes are seen.  The colon is unremarkable in appearance.  The bladder is largely decompressed and grossly unremarkable in appearance. The uterus is within normal limits. The ovaries are relatively symmetric; no suspicious adnexal masses are seen. No inguinal lymphadenopathy is  seen.  No acute osseous abnormalities are identified.  IMPRESSION: Acute appendicitis noted, with dilatation of the appendix to 9 mm distally, associated soft tissue inflammation and trace fluid. No evidence of perforation or abscess formation at this time. Prominent pericecal nodes seen.  These results were called by telephone at the time of interpretation on 05/18/2013 at 5:34 AM to [facility] PA, who verbally acknowledged these results.   Electronically Signed   By: Garald Balding M.D.   On: 05/18/2013 05:34       Assessment/Plan 1. Acute appendicitis 2. Chiari malformation with VP shunt  Plan: 1. I have discussed this patient with Dr. Arnoldo Morale of neurosurgery.  He has no other recommendations for her VP shunt besides our normal standard of care.  She will be admitted for surgical resection of her appendix.  She has been given Rocephin and flagyl so far for abx therapy.  She will remain NPO until we can proceed with surgery.  The procedure along with risks, complications, and expected outcome have been explained to the patient.  She understands and is agreeable to proceed.  Henreitta Cea 05/18/2013, 7:48 AM Pager: [phone]

## 2013-05-18 NOTE — Progress Notes (Signed)
[facility] provided pt with a list of primary care resources. Patient stated pending medicaid. °

## 2013-05-18 NOTE — Anesthesia Postprocedure Evaluation (Signed)
  Anesthesia Post-op Note  Patient: Maria FantasiaMegan M Ellis  Procedure(s) Performed: Procedure(s) (LRB): APPENDECTOMY LAPAROSCOPIC (Left)  Patient Location: PACU  Anesthesia Type: General  Level of Consciousness: awake and alert   Airway and Oxygen Therapy: Patient Spontanous Breathing  Post-op Pain: mild  Post-op Assessment: Post-op Vital signs reviewed, Patient's Cardiovascular Status Stable, Respiratory Function Stable, Patent Airway and No signs of Nausea or vomiting  Last Vitals:  Filed Vitals:   05/18/13 1315  BP:   Pulse: 56  Temp:   Resp: 17    Post-op Vital Signs: stable   Complications: No apparent anesthesia complications

## 2013-05-18 NOTE — H&P (Signed)
General Surgery [facility]  Patient seen and examined in ER.  Discussed lap appendectomy and possibility of open surgery.  Discussed hospital stay to be anticipated.  Discussed issues with V-P shunt.  The risks and benefits of the procedure have been discussed at length with the patient.  The patient understands the proposed procedure, potential alternative treatments, and the course of recovery to be expected.  All of the patient's questions have been answered at this time.  The patient wishes to proceed with surgery.  Velora Hecklerodd M. Khadija Thier, MD, [facility] Office: [phone]

## 2013-05-18 NOTE — Care Management Note (Signed)
UR complete    Alam Guterrez,MSN,RN [phone] 

## 2013-05-18 NOTE — ED Notes (Signed)
Bed: [id number] Expected date:  Expected time:  Means of arrival:  [facility]

## 2013-05-18 NOTE — Anesthesia Preprocedure Evaluation (Signed)
Anesthesia Evaluation  Patient identified by MRN, date of birth, ID band Patient awake    Reviewed: Allergy & Precautions, H&P , NPO status , Patient's Chart, lab work & pertinent test results  Airway Mallampati: II TM Distance: >3 FB Neck ROM: Full    Dental no notable dental hx.    Pulmonary Current Smoker,  breath sounds clear to auscultation  Pulmonary exam normal       Cardiovascular negative cardio ROS  Rhythm:Regular Rate:Normal     Neuro/Psych Chiari malformation with shunt negative psych ROS   GI/Hepatic negative GI ROS, Neg liver ROS,   Endo/Other  negative endocrine ROS  Renal/GU negative Renal ROS  negative genitourinary   Musculoskeletal negative musculoskeletal ROS (+)   Abdominal   Peds negative pediatric ROS (+)  Hematology negative hematology ROS (+)   Anesthesia Other Findings   Reproductive/Obstetrics negative OB ROS                           Anesthesia Physical Anesthesia Plan  ASA: II  Anesthesia Plan: General   Post-op Pain Management:    Induction: Intravenous, Rapid sequence and Cricoid pressure planned  Airway Management Planned: Oral ETT  Additional Equipment:   Intra-op Plan:   Post-operative Plan: Extubation in OR  Informed Consent: I have reviewed the patients History and Physical, chart, labs and discussed the procedure including the risks, benefits and alternatives for the proposed anesthesia with the patient or authorized representative who has indicated his/her understanding and acceptance.   Dental advisory given  Plan Discussed with: CRNA  Anesthesia Plan Comments:         Anesthesia Quick Evaluation

## 2013-05-18 NOTE — Transfer of Care (Signed)
Immediate Anesthesia Transfer of Care Note  Patient: Maria FantasiaMegan M Fissel  Procedure(s) Performed: Procedure(s) (LRB): APPENDECTOMY LAPAROSCOPIC (Left)  Patient Location: PACU  Anesthesia Type: General  Level of Consciousness: sedated, patient cooperative and responds to stimulation  Airway & Oxygen Therapy: Patient Spontanous Breathing and Patient connected to face mask oxgen  Post-op Assessment: Report given to PACU RN and Post -op Vital signs reviewed and stable  Post vital signs: Reviewed and stable  Complications: No apparent anesthesia complications

## 2013-05-18 NOTE — Discharge Instructions (Signed)
Laparoscopic Appendectomy °Care After °Refer to this sheet in the next few weeks. These instructions provide you with information on caring for yourself after your procedure. Your caregiver may also give you more specific instructions. Your treatment has been planned according to current medical practices, but problems sometimes occur. Call your caregiver if you have any problems or questions after your procedure. °HOME CARE INSTRUCTIONS °· Do not drive while taking narcotic pain medicines. °· Use stool softener if you become constipated from your pain medicines. °· Change your bandages (dressings) as directed. °· Keep your wounds clean and dry. You may wash the wounds gently with soap and water. Gently pat the wounds dry with a clean towel. °· Do not take baths, swim, or use hot tubs for 10 days, or as instructed by your caregiver. °· Only take over-the-counter or prescription medicines for pain, discomfort, or fever as directed by your caregiver. °· You may continue your normal diet as directed. °· Do not lift more than 10 pounds (4.5 kg) or play contact sports for 3 weeks, or as directed. °· Slowly increase your activity after surgery. °· Take deep breaths to avoid getting a lung infection (pneumonia). °SEEK MEDICAL CARE IF: °· You have redness, swelling, or increasing pain in your wounds. °· You have pus coming from your wounds. °· You have drainage from a wound that lasts longer than 1 day. °· You notice a bad smell coming from the wounds or dressing. °· Your wound edges break open after stitches (sutures) have been removed. °· You notice increasing pain in the shoulders (shoulder strap areas) or near your shoulder blades. °· You develop dizzy episodes or fainting while standing. °· You develop shortness of breath. °· You develop persistent nausea or vomiting. °· You cannot control your bowel functions or lose your appetite. °· You develop diarrhea. °SEEK IMMEDIATE MEDICAL CARE IF:  °· You have a fever. °· You  develop a rash. °· You have difficulty breathing or sharp pains in your chest. °· You develop any reaction or side effects to medicines given. °MAKE SURE YOU: °· Understand these instructions. °· Will watch your condition. °· Will get help right away if you are not doing well or get worse. °Document Released: 01/06/2005 Document Revised: 03/31/2011 Document Reviewed: 07/16/2010 °ExitCare® Patient Information ©2014 [employer]. ° °CCS ______[facility] °LAPAROSCOPIC SURGERY: POST OP INSTRUCTIONS °Always review your discharge instruction sheet given to you by the facility where your surgery was performed. °IF YOU HAVE DISABILITY OR FAMILY LEAVE FORMS, YOU MUST BRING THEM TO THE OFFICE FOR PROCESSING.   °DO NOT GIVE THEM TO YOUR DOCTOR. ° °1. A prescription for pain medication may be given to you upon discharge.  Take your pain medication as prescribed, if needed.  If narcotic pain medicine is not needed, then you may take acetaminophen (Tylenol) or ibuprofen (Advil) as needed. °2. Take your usually prescribed medications unless otherwise directed. °3. If you need a refill on your pain medication, please contact your pharmacy.  They will contact our office to request authorization. Prescriptions will not be filled after 5pm or on week-ends. °4. You should follow a light diet the first few days after arrival home, such as soup and crackers, etc.  Be sure to include lots of fluids daily. °5. Most patients will experience some swelling and bruising in the area of the incisions.  Ice packs will help.  Swelling and bruising can take several days to resolve.  °6. It is common to experience   some constipation if taking pain medication after surgery.  Increasing fluid intake and taking a stool softener (such as Colace) will usually help or prevent this problem from occurring.  A mild laxative (Milk of Magnesia or Miralax) should be taken according to package instructions if there are no bowel movements  after 48 hours. °7. Unless discharge instructions indicate otherwise, you may remove your bandages 24-48 hours after surgery, and you may shower at that time.  You may have steri-strips (small skin tapes) in place directly over the incision.  These strips should be left on the skin for 7-10 days.  If your surgeon used skin glue on the incision, you may shower in 24 hours.  The glue will flake off over the next 2-3 weeks.  Any sutures or staples will be removed at the office during your follow-up visit. °8. ACTIVITIES:  You may resume regular (light) daily activities beginning the next day--such as daily self-care, walking, climbing stairs--gradually increasing activities as tolerated.  You may have sexual intercourse when it is comfortable.  Refrain from any heavy lifting or straining until approved by your doctor. °a. You may drive when you are no longer taking prescription pain medication, you can comfortably wear a seatbelt, and you can safely maneuver your car and apply brakes. °b. RETURN TO WORK:  __________________________________________________________ °9. You should see your doctor in the office for a follow-up appointment approximately 2-3 weeks after your surgery.  Make sure that you call for this appointment within a day or two after you arrive home to insure a convenient appointment time. °10. OTHER INSTRUCTIONS: __________________________________________________________________________________________________________________________ __________________________________________________________________________________________________________________________ °WHEN TO CALL YOUR DOCTOR: °1. Fever over 101.0 °2. Inability to urinate °3. Continued bleeding from incision. °4. Increased pain, redness, or drainage from the incision. °5. Increasing abdominal pain ° °The clinic staff is available to answer your questions during regular business hours.  Please don’t hesitate to call and ask to speak to one of the  nurses for clinical concerns.  If you have a medical emergency, go to the nearest emergency room or call 911.  A surgeon from [facility] is always on call at the hospital. °[address], [address], [place], [place]  [address] ? [address] [address], [place], [place]   [address] °[phone] ? [phone] ? FAX [phone] °Web site: [email or web address] ° °

## 2013-05-18 NOTE — Op Note (Signed)
OPERATIVE REPORT - LAPAROSCOPIC APPENDECTOMY  Preop diagnosis: Acute appendicitis  Postop diagnosis: Same  Procedure: Laparoscopic appendectomy, laparoscopic lysis of adhesions  Surgeon:  Velora Hecklerodd M. Annalyssa Thune, MD, FACS  Anesthesia: General endotracheal  Estimated blood loss: Minimal  Preparation: Chlora-prep  Complications: None  Indications:  Patient is a 28 yo WF who presents to ER with abdominal pain and elevated WBC.  CTA positive for acute appendicitis.  Previous placement of V-P shunt.  Procedure:  Patient is brought to the operating room and placed in a supine position on the operating room table. Following administration of general anesthesia, a time out was held and the patient's name and procedure is confirmed. Patient is then prepped and draped in the usual strict aseptic fashion.  After ascertaining that an adequate level of anesthesia has been achieved, a peri-umbilical incision is made with a #15 blade. Dissection is carried down to the fascia. Fascia is incised in the midline and the peritoneal cavity is entered cautiously. A #0-vicryl pursestring suture is placed in the fascia. An Hassan cannula is introduced under direct vision and secured with the pursestring suture. The abdomen is insufflated with carbon dioxide. The laparoscope is introduced and the abdomen is explored.  There are adhesions of the omentum and small bowel loops to the anterior abdominal wall and right abdominal wall.  The right colon, cecum, and appendix are not visible.  Operative port is placed in the left lower quadrant.   A third port is placed under direct vision in the mid left abdominal wall.  Adhesions are sharply lysed with scissors and the omentum and small bowel loops are mobilized off the abdominal wall exposing the right colon and cecum.  Lysis of adhesions took about 20 minutes of operative time. The appendix is identified. The mesoappendix is divided with the harmonic scalpel. Dissection is  carried down to the base of the appendix. The base of the appendix is dissected out clearing the junction with the cecal wall. Using an Endo-GIA stapler, the base of the appendix is transected at the junction with the cecal wall. There is good approximation of tissue along the staple line. There is good hemostasis along the staple line. The appendix is placed into an endo-catch bag and withdrawn through the umbilical port. The #0-vicryl pursestring suture is tied securely.  Right lower quadrant is irrigated with warm saline which is evacuated. Good hemostasis is noted. Ports are removed under direct vision. Good hemostasis is noted at the port sites. Pneumoperitoneum is released.  Skin incisions are anesthetized with local anesthetic. Wounds are closed with interrupted 4-0 Monocryl subcuticular sutures. Wounds are washed and dried and benzoin and Steri-Strips are applied. Dressings are applied. The patient is awakened from anesthesia and brought to the recovery room. The patient tolerated the procedure well.  Velora Hecklerodd M. Linus Weckerly, MD, [facility], [place] Office: [phone]

## 2013-05-18 NOTE — ED Provider Notes (Signed)
CSN: [id number]     Arrival date & time 05/17/13  1905 History   First MD Initiated Contact with Patient 05/18/13 [id number]     Chief Complaint  Patient presents with  . Abdominal Pain     (Consider location/radiation/quality/duration/timing/severity/associated sxs/prior Treatment) HPI Comments: Vision states, that she's been having abdominal pain for the past 3, days.  It started as epigastric and bilateral upper quadrant pain.  That has now radiated to sharp, suprapubic pain. [facility]  She had a ultrasound, pelvic exam, labs, and urine, and [facility] and CT scan Per report, ultrasound, was normal.  Urine was normal.  Pelvic exam was noncontributory.  White count shows an elevation to 17.6.  Patient is a 28 y.o. female presenting with abdominal pain. The history is provided by the patient.  Abdominal Pain Pain location:  Suprapubic Pain quality: aching and stabbing   Pain radiates to:  Does not radiate Pain severity:  Severe Onset quality:  Gradual Duration:  3 days Timing:  Constant Progression:  Worsening Chronicity:  New Context: not diet changes, not laxative use, not medication withdrawal, not previous surgeries, not retching and not trauma   Relieved by:  Nothing Associated symptoms: no constipation, no cough, no diarrhea, no dysuria, no fever, no nausea, no shortness of breath, no vaginal bleeding, no vaginal discharge and no vomiting   Risk factors comment:  VP shunt with abdominal drain    Past Medical History  Diagnosis Date  . Hydrocephalus   . Bell's palsy   . History of Chiari malformation    Past Surgical History  Procedure Laterality Date  . Shunt replacement    . Brain surgery     History reviewed. No pertinent family history. History  Substance Use Topics  . Smoking status: Current Every Day Smoker -- 0.50 packs/day    Types: Cigarettes  . Smokeless tobacco: Not on file  . Alcohol  Use: No   OB History   Grav Para Term Preterm Abortions TAB SAB Ect Mult Living   1    1  1         Review of Systems  Constitutional: Negative for fever.  Respiratory: Negative for cough and shortness of breath.   Gastrointestinal: Positive for abdominal pain. Negative for nausea, vomiting, diarrhea and constipation.  Genitourinary: Negative for dysuria, vaginal bleeding, vaginal discharge and pelvic pain.  All other systems reviewed and are negative.     Allergies  Penicillins and Tylenol  Home Medications   Prior to Admission medications   Medication Sig Start Date End Date Taking? Authorizing Provider  gabapentin (NEURONTIN) 300 MG capsule 1 po tid for 7 days, then 2 po tid if pain not improving. 04/17/13  Yes Rolland PorterMark James, MD  ibuprofen (ADVIL,MOTRIN) 200 MG tablet Take 400-600 mg by mouth every 6 (six) hours as needed (pain).    Yes Historical Provider, MD  methocarbamol (ROBAXIN) 500 MG tablet Take 1 tablet (500 mg total) by mouth 2 (two) times daily. 04/17/13  Yes Rolland PorterMark James, MD  oxycodone (OXY-IR) 5 MG capsule Take 1 capsule (5 mg total) by mouth every 4 (four) hours as needed. 04/17/13  Yes Rolland PorterMark James, MD   BP 118/67  Pulse 83  Temp(Src) 98.1 F (36.7 C) (Oral)  Resp 20  Ht 5\' 2"  (1.575 m)  Wt 172 lb 12.8 oz (78.382 kg)  BMI 31.60 kg/m2  SpO2 99%  LMP 03/20/2013 Physical Exam  Nursing note  and vitals reviewed. Constitutional: She is oriented to person, place, and time. She appears well-developed and well-nourished.  HENT:  Head: Normocephalic.  Eyes: Pupils are equal, round, and reactive to light.  Neck: Normal range of motion.  Cardiovascular: Normal rate and regular rhythm.   Pulmonary/Chest: Effort normal.  Abdominal: Soft. Bowel sounds are normal. She exhibits no distension. There is tenderness in the suprapubic area.  Musculoskeletal: Normal range of motion.  Neurological: She is alert and oriented to person, place, and time.  Skin: Skin is warm and dry.     ED Course  Procedures (including critical care time) Labs Review Labs Reviewed  WET PREP, GENITAL - Abnormal; Notable for the following:    WBC, Wet Prep HPF POC FEW (*)    All other components within normal limits  CBC - Abnormal; Notable for the following:    WBC 17.2 (*)    RBC 5.20 (*)    Hemoglobin 15.5 (*)    All other components within normal limits  COMPREHENSIVE METABOLIC PANEL - Abnormal; Notable for the following:    Chloride 95 (*)    All other components within normal limits  DIFFERENTIAL - Abnormal; Notable for the following:    Neutro Abs 10.9 (*)    Lymphs Abs 4.6 (*)    Monocytes Absolute 1.4 (*)    All other components within normal limits  GC/CHLAMYDIA PROBE AMP  URINALYSIS, ROUTINE W REFLEX MICROSCOPIC  POCT PREGNANCY, URINE    Imaging Review US Transvaginal Non-ob  05/18/2013   CLINICAL DATA:  Lower abdominal pain.  EXAM: TRANSABDOMINAL AND TRANSVAGINAL ULTRASOUND OF PELVIS  TECHNIQUE: Both transabdominal and transvaginal ultrasound examinations of the pelvis were performed. Transabdominal technique was performed for global imaging of the pelvis including uterus, ovaries, adnexal regions, and pelvic cul-de-sac. It was necessary to proceed with endovaginal exam following the transabdominal exam to visualize the uterus and ovaries in greater detail.  COMPARISON:  Pelvic ultrasound, and CT of the abdomen and pelvis, performed 03/11/2013  FINDINGS: Uterus  Measurements: 6.6 x 3.1 x 3.8 cm. No fibroids or other mass visualized.  Endometrium  Thickness: 0.6 cm.  No focal abnormality visualized.  Right ovary  Measurements: 4.8 x 3.0 x 2.3 cm. Normal appearance/no adnexal mass.  Left ovary  Measurements: 3.2 x 2.2 x 2.7 cm. Normal appearance/no adnexal mass.  Other findings  No free fluid is seen within the pelvic cul-de-sac.  IMPRESSION: Unremarkable pelvic ultrasound.   Electronically Signed   By: Roanna Raider M.D.   On: 05/18/2013 00:57   US Pelvis  Complete  05/18/2013   CLINICAL DATA:  Lower abdominal pain.  EXAM: TRANSABDOMINAL AND TRANSVAGINAL ULTRASOUND OF PELVIS  TECHNIQUE: Both transabdominal and transvaginal ultrasound examinations of the pelvis were performed. Transabdominal technique was performed for global imaging of the pelvis including uterus, ovaries, adnexal regions, and pelvic cul-de-sac. It was necessary to proceed with endovaginal exam following the transabdominal exam to visualize the uterus and ovaries in greater detail.  COMPARISON:  Pelvic ultrasound, and CT of the abdomen and pelvis, performed 03/11/2013  FINDINGS: Uterus  Measurements: 6.6 x 3.1 x 3.8 cm. No fibroids or other mass visualized.  Endometrium  Thickness: 0.6 cm.  No focal abnormality visualized.  Right ovary  Measurements: 4.8 x 3.0 x 2.3 cm. Normal appearance/no adnexal mass.  Left ovary  Measurements: 3.2 x 2.2 x 2.7 cm. Normal appearance/no adnexal mass.  Other findings  No free fluid is seen within the pelvic cul-de-sac.  IMPRESSION: Unremarkable pelvic ultrasound.  Electronically Signed   By: Roanna RaiderJeffery  Chang M.D.   On: 05/18/2013 00:57     EKG Interpretation None      MDM  [facility].  She rides in no acute distress, but reports increased in pain from the right and it changes.  No nausea, no fever.  She understands that she will be receiving a CT scan for further evaluation.  Have asked that she be given IV pain medication, as well as antimanic I spoke with Dr. Donell BeersByerly general surgery, who will come evaluate the patient and take her to surgery.  After 7 AM she requested the patient.  Be started on antibiotic to to her penicillin allergy.  She will be started on 1 g of Rocephin and 500 mg of Flagyl.  She will be kept n.p.o. Final diagnoses:  Abdominal pain, acute, generalized  Leukocytosis         Arman FilterGail K Mccormick [place], NP 05/18/13 [phone]

## 2013-05-18 NOTE — ED Notes (Signed)
[facility]  Patient alert and oriented x 4 and appears in NAD NP at bedside

## 2013-05-18 NOTE — ED Notes (Signed)
This RN notified 5W that Pt would be going to surgery prior to coming to room.

## 2013-05-18 NOTE — ED Notes (Signed)
Patient asking for additional pain medication--EDP made aware

## 2013-05-19 ENCOUNTER — Encounter (HOSPITAL_COMMUNITY): Payer: Self-pay | Admitting: Surgery

## 2013-05-19 MED ORDER — OXYCODONE HCL 5 MG PO TABS: 5.0000 mg | ORAL_TABLET | ORAL | Status: DC | PRN

## 2013-05-19 MED ORDER — IBUPROFEN 800 MG PO TABS: 400.0000 mg | ORAL_TABLET | Freq: Four times a day (QID) | ORAL | Status: DC | PRN

## 2013-05-19 MED ORDER — KETOROLAC TROMETHAMINE 15 MG/ML IJ SOLN
15.0000 mg | Freq: Once | INTRAMUSCULAR | Status: AC
  Administered 2013-05-19: 15 mg via INTRAVENOUS
  Filled 2013-05-19: qty 1

## 2013-05-19 NOTE — Progress Notes (Signed)
General Surgery [facility]  Agree.  Doing well.  Home today.  Velora Hecklerodd M. Debria Broecker, MD, [facility] Office: [phone]

## 2013-05-19 NOTE — Progress Notes (Signed)
1 Day Post-Op  Subjective: Eating drinking and voiding without difficulty.  Some pain.  Sites look fine.  Objective: Vital signs in last 24 hours: Temp:  [97.8 F (36.6 C)-98.6 F (37 C)] 97.8 F (36.6 C) (04/30 0600) Pulse Rate:  [56-73] 60 (04/30 0600) Resp:  [13-19] 16 (04/30 0600) BP: (92-124)/(49-74) 107/74 mmHg (04/30 0600) SpO2:  [96 %-100 %] 99 % (04/30 0600) Last BM Date: 05/17/13 1920 PO recorded Afebrile, VSS No labs Intake/Output from previous day: 04/29 0701 - 04/30 0700 In: 3996.7 [P.O.:1920; I.V.:2076.7] Out: 4100 [Urine:4100] Intake/Output this shift: Total I/O In: -  Out: 400 [Urine:400]  General appearance: alert, cooperative and no distress GI: soft very sore, port sites look fine.  i changed steri strips on the umbilical site.  Lab Results:   Recent Labs  05/17/13 2052  WBC 17.2*  HGB 15.5*  HCT 44.0  PLT 274    BMET  Recent Labs  05/17/13 2052  NA 137  K 4.7  CL 95*  CO2 28  GLUCOSE 85  BUN 10  CREATININE 0.75  CALCIUM 9.6   PT/INR No results found for this basename: LABPROT, INR,  in the last 72 hours   Recent Labs Lab 05/17/13 2052  AST 15  ALT 23  ALKPHOS 104  BILITOT 0.7  PROT 7.4  ALBUMIN 4.2     Lipase     Component Value Date/Time   LIPASE 29 06/13/2012 1555     Studies/Results: [place] Transvaginal Non-ob  05/18/2013   CLINICAL DATA:  Lower abdominal pain.  EXAM: TRANSABDOMINAL AND TRANSVAGINAL ULTRASOUND OF PELVIS  TECHNIQUE: Both transabdominal and transvaginal ultrasound examinations of the pelvis were performed. Transabdominal technique was performed for global imaging of the pelvis including uterus, ovaries, adnexal regions, and pelvic cul-de-sac. It was necessary to proceed with endovaginal exam following the transabdominal exam to visualize the uterus and ovaries in greater detail.  COMPARISON:  Pelvic ultrasound, and CT of the abdomen and pelvis, performed 03/11/2013  FINDINGS: Uterus  Measurements: 6.6 x 3.1  x 3.8 cm. No fibroids or other mass visualized.  Endometrium  Thickness: 0.6 cm.  No focal abnormality visualized.  Right ovary  Measurements: 4.8 x 3.0 x 2.3 cm. Normal appearance/no adnexal mass.  Left ovary  Measurements: 3.2 x 2.2 x 2.7 cm. Normal appearance/no adnexal mass.  Other findings  No free fluid is seen within the pelvic cul-de-sac.  IMPRESSION: Unremarkable pelvic ultrasound.   Electronically Signed   By: Roanna RaiderJeffery  Chang M.D.   On: 05/18/2013 00:57   [place] Pelvis Complete  05/18/2013   CLINICAL DATA:  Lower abdominal pain.  EXAM: TRANSABDOMINAL AND TRANSVAGINAL ULTRASOUND OF PELVIS  TECHNIQUE: Both transabdominal and transvaginal ultrasound examinations of the pelvis were performed. Transabdominal technique was performed for global imaging of the pelvis including uterus, ovaries, adnexal regions, and pelvic cul-de-sac. It was necessary to proceed with endovaginal exam following the transabdominal exam to visualize the uterus and ovaries in greater detail.  COMPARISON:  Pelvic ultrasound, and CT of the abdomen and pelvis, performed 03/11/2013  FINDINGS: Uterus  Measurements: 6.6 x 3.1 x 3.8 cm. No fibroids or other mass visualized.  Endometrium  Thickness: 0.6 cm.  No focal abnormality visualized.  Right ovary  Measurements: 4.8 x 3.0 x 2.3 cm. Normal appearance/no adnexal mass.  Left ovary  Measurements: 3.2 x 2.2 x 2.7 cm. Normal appearance/no adnexal mass.  Other findings  No free fluid is seen within the pelvic cul-de-sac.  IMPRESSION: Unremarkable pelvic ultrasound.  Electronically Signed   By: Roanna RaiderJeffery  Chang M.D.   On: 05/18/2013 00:57   Ct Abdomen Pelvis W Contrast  05/18/2013   CLINICAL DATA:  Abdominal pain.  Leukocytosis.  EXAM: CT ABDOMEN AND PELVIS WITH CONTRAST  TECHNIQUE: Multidetector CT imaging of the abdomen and pelvis was performed using the standard protocol following bolus administration of intravenous contrast.  CONTRAST:  100mL OMNIPAQUE IOHEXOL 300 MG/ML  SOLN  COMPARISON:  CT  of the abdomen and pelvis performed 03/11/2013  FINDINGS: Minimal left basilar atelectasis is noted.  A ventriculoperitoneal shunt is seen coiling about the upper abdomen, ending at the left upper quadrant.  The liver and spleen are unremarkable in appearance. The gallbladder is within normal limits. The pancreas and adrenal glands are unremarkable.  The kidneys are unremarkable in appearance. There is no evidence of hydronephrosis. No renal or ureteral stones are seen. No perinephric stranding is appreciated.  No free fluid is identified. The small bowel is unremarkable in appearance. The stomach is within normal limits. No acute vascular abnormalities are seen.  The appendix is dilated to 9 mm in maximal diameter distally, with associated soft tissue inflammation and trace fluid, compatible with acute appendicitis. This is best characterized on coronal images. There is no evidence of perforation or abscess formation at this time. Prominent pericecal nodes are seen.  The colon is unremarkable in appearance.  The bladder is largely decompressed and grossly unremarkable in appearance. The uterus is within normal limits. The ovaries are relatively symmetric; no suspicious adnexal masses are seen. No inguinal lymphadenopathy is seen.  No acute osseous abnormalities are identified.  IMPRESSION: Acute appendicitis noted, with dilatation of the appendix to 9 mm distally, associated soft tissue inflammation and trace fluid. No evidence of perforation or abscess formation at this time. Prominent pericecal nodes seen.  These results were called by telephone at the time of interpretation on 05/18/2013 at 5:34 AM to [facility], who verbally acknowledged these results.   Electronically Signed   By: Roanna RaiderJeffery  Chang M.D.   On: 05/18/2013 05:34    Medications: . gabapentin  300 mg Oral TID  . methocarbamol  500 mg Oral BID    Assessment/Plan Acute appendicitis S/p Laparoscopic appendectomy, laparoscopic lysis of  adhesions, 05/18/2013,  Velora Hecklerodd M Gerkin, MD.  Hydrocephalus  Bell's palsy  History of Chiari malformation Migraines/ on disability Body mass index is 31.6 kg/(m^2).  Plan:  Home today it [birth year].     LOS: 2 days    Sherrie GeorgeWillard Corliss Coggeshall 05/19/2013

## 2013-05-23 NOTE — Discharge Summary (Signed)
General Surgery - [facility]  Agree with summary.  Issachar Broady M. Tavio Biegel, MD, FACS [facility] Office: [phone]   

## 2013-05-23 NOTE — Discharge Summary (Signed)
Physician Discharge Summary  Patient ID: Maria Ellis MRN: [id number] DOB/AGE: [birth year] 28 y.o.  Admit date: 05/17/2013 Discharge date: 05/23/2013  Admission Diagnoses:  Acute appendicitis  Discharge Diagnoses:  Acute appendicitis  Hydrocephalus  Bell's palsy  History of Chiari malformation  Migraines/ on disability  Body mass index is 31.6 kg/(m^2).  Active Problems:   Acute appendicitis   PROCEDURES:S/p Laparoscopic appendectomy, laparoscopic lysis of adhesions, 05/18/2013, Velora Hecklerodd M Gerkin, MD.     Hospital Course:  this is a 28 yo female who had a h/o Chiari malformation with a VP shunt. She began having upper abdominal pain on Monday. Her pain progressed and migrated to lower mid abdomen. It radiated some to the left and right lower quadrants. She denies fevers, chills, nausea, or vomiting. [facility]. After a workup, she was found to have appendicitis on her CT scan. We have been asked to evaluate the patient for admission.  She was found to have appendicitis, she has a VP shunt, risk discussed and she was take to the OR, with the above procedure being performed.  She tolerated the procedure well.  She was eating and drinking post op and was anxious to go home.  She was placed back on antibiotics and sent home later the 1st post op day.  Follow up in 2-3 weeks.  Condition on d/c:  Improved     Disposition: 01-Home or Self Care   Future Appointments Provider Department Dept Phone   06/07/2013 3:15 PM [facility] [place] [phone]       Medication List    STOP taking these medications       oxycodone 5 MG capsule  Commonly known as:  OXY-IR  Replaced by:  oxyCODONE 5 MG immediate release tablet      TAKE these medications       gabapentin 300 MG capsule  Commonly known as:  NEURONTIN  1 po tid for 7 days, then 2 po tid if pain not improving.     ibuprofen 200 MG tablet  Commonly known as:   ADVIL,MOTRIN  Take 400-600 mg by mouth every 6 (six) hours as needed (pain).     methocarbamol 500 MG tablet  Commonly known as:  ROBAXIN  Take 1 tablet (500 mg total) by mouth 2 (two) times daily.     oxyCODONE 5 MG immediate release tablet  Commonly known as:  Oxy IR/ROXICODONE  Take 1-2 tablets (5-10 mg total) by mouth every 4 (four) hours as needed for moderate pain.           Follow-up Information   Follow up with [facility] On 06/07/2013. (You have an appointment at 3:15PM, please be at the office by 2:45 PM)    Contact information:   [address]   [place] [address] [phone]       Follow up with No PCP Per Patient.   Specialty:  General Practice      Signed: Sherrie GeorgeWillard Dariann Huckaba 05/23/2013, 2:51 PM

## 2013-06-07 ENCOUNTER — Encounter (INDEPENDENT_AMBULATORY_CARE_PROVIDER_SITE_OTHER): Payer: Medicaid Other

## 2013-06-16 ENCOUNTER — Encounter (INDEPENDENT_AMBULATORY_CARE_PROVIDER_SITE_OTHER): Payer: Self-pay | Admitting: Surgery

## 2013-11-21 ENCOUNTER — Encounter (HOSPITAL_COMMUNITY): Payer: Self-pay | Admitting: Surgery

## 2014-06-06 ENCOUNTER — Ambulatory Visit (INDEPENDENT_AMBULATORY_CARE_PROVIDER_SITE_OTHER): Payer: Medicaid Other | Admitting: Neurology

## 2014-06-06 ENCOUNTER — Telehealth: Payer: Self-pay | Admitting: Neurology

## 2014-06-06 ENCOUNTER — Encounter: Payer: Self-pay | Admitting: Neurology

## 2014-06-06 VITALS — BP 117/70 | HR 75 | Ht 62.0 in | Wt 169.0 lb

## 2014-06-06 DIAGNOSIS — Z982 Presence of cerebrospinal fluid drainage device: Secondary | ICD-10-CM | POA: Diagnosis not present

## 2014-06-06 DIAGNOSIS — G510 Bell's palsy: Secondary | ICD-10-CM

## 2014-06-06 DIAGNOSIS — R519 Headache, unspecified: Secondary | ICD-10-CM

## 2014-06-06 DIAGNOSIS — R51 Headache: Secondary | ICD-10-CM | POA: Diagnosis not present

## 2014-06-06 DIAGNOSIS — G919 Hydrocephalus, unspecified: Secondary | ICD-10-CM | POA: Diagnosis not present

## 2014-06-06 MED ORDER — CELECOXIB 100 MG PO CAPS: 100.0000 mg | ORAL_CAPSULE | Freq: Two times a day (BID) | ORAL | Status: DC

## 2014-06-06 MED ORDER — TOPIRAMATE 100 MG PO TABS: 100.0000 mg | ORAL_TABLET | Freq: Two times a day (BID) | ORAL | Status: DC

## 2014-06-06 NOTE — Telephone Encounter (Signed)
Patient called stating that the script Dr. Terrace ArabiaYan Rx her today celecoxib (CELEBREX) 100 MG capsule needs prior authorization through [place] [employer] [place]. Please call and advise. Patient can be reached @ [phone]

## 2014-06-06 NOTE — Telephone Encounter (Signed)
I contacted ins and provided clinical info.  Request is currently under review Ref Key: [id number].  I called the patient to advise.  Left message.

## 2014-06-06 NOTE — Progress Notes (Signed)
PATIENT: Maria Ellis DOB: 1985-07-24  Chief Complaint  Patient presents with  . Bell's Palsy    Reports this being her seventh occurrence of Bell's Palsy in the last five years.  She just finished a round of steroids and valacylovir without much improvement in symptoms.    HISTORICAL  Maria Ellis is a 29 years old left-handed female, referred by her primary care PA [facility] for evaluation of recurrent right side Bell's palsy.  She had a history of hydrocephalus since she was born, had her first VP shunt placement when she was 2 months old, later develop sleepiness, frequent headaches, nausea or vomiting, was diagnosed with VP shunt blockage, had replacement when she was 29 years old, she has been doing well since then,  In 2014, she developed frequent headaches, right arm numbness tingling weakness, was diagnosed with Arnold-Chiari malformation, had decompression surgery at [facility], which has helped her symptoms only partially.  Since 2011, over 5 years span, per patient, she has recurrent episodes of right facial weakness, last episode was in 2013, most recent one started May 30 2014, she woke up noticed right facial weakness, difficulty closing her right eye, sharp pain in her right ear, she was treated with a week course of tapering dose of prednisone, 60 mg times 2, 40 mg times 2 days, 20 mg times 2 days, and also acyclovir 1000 mg 3 times a day for 1 week, she has no significant improvement in her right facial weakness yet, continue have moderate to severe right ear pain, she denies rash broke out, no hearing loss no dizziness, no gait difficulty.  She has been under neurologist Dr. Applegate's care for 15 years, "he does not do what I want him to do"  Previously, she has tried Neurontin, Lyrica, no help, swelling, Cymbalta, muscle spasm.  She wants some medications for her right ear pain, Currently taking Topamax 100 mg every night as headache prevention,  tolerating the medication well, no significant side effect.  REVIEW OF SYSTEMS: Full 14 system review of systems performed and notable only for double vision, blurry vision, loss of vision, eye pain, headaches, slurred speech, is in his, depression.  ALLERGIES: Allergies  Allergen Reactions  . Penicillins Swelling  . Tylenol [Acetaminophen] Rash    HOME MEDICATIONS: Current Outpatient Prescriptions  Medication Sig Dispense Refill  . fluticasone (FLONASE) 50 MCG/ACT nasal spray Place 2 sprays into both nostrils daily.    Marland Kitchen ibuprofen (ADVIL,MOTRIN) 600 MG tablet Take 600 mg by mouth every 8 (eight) hours as needed.    . lubiprostone (AMITIZA) 24 MCG capsule Take 24 mcg by mouth 2 (two) times daily with a meal.    . Olopatadine HCl 0.2 % SOLN Apply 2 drops to eye daily.    . ondansetron (ZOFRAN) 8 MG tablet Take 8 mg by mouth every 8 (eight) hours as needed for nausea or vomiting.    . OxyCODONE HCl (ROXICODONE PO) Take 10 mg by mouth 4 (four) times daily as needed.    . topiramate (TOPAMAX) 100 MG tablet Take 100 mg by mouth at bedtime.      PAST MEDICAL HISTORY: Past Medical History  Diagnosis Date  . Hydrocephalus   . Bell's palsy   . History of Chiari malformation   . Headache     PAST SURGICAL HISTORY: Past Surgical History  Procedure Laterality Date  . Shunt replacement      x 2  . Brain surgery    .  Laparoscopic appendectomy Left 05/18/2013    Procedure: APPENDECTOMY LAPAROSCOPIC;  Surgeon: Velora Hecklerodd M Gerkin, MD;  Location: [facility];  Service: General;  Laterality: Left;  Marland Kitchen. Multiple tooth extractions    . Tonsilectomy, adenoidectomy, bilateral myringotomy and tubes      FAMILY HISTORY: Family History  Problem Relation Age of Onset  . Diabetes Mother   . Diabetes Father   . Heart failure Father   . Cirrhosis Father     SOCIAL HISTORY:  History   Social History  . Marital Status: Single    Spouse Name: N/A  . Number of Children: 0  . Years of Education: 12    Occupational History  . Disabled    Social History Main Topics  . Smoking status: Current Every Day Smoker    Types: Cigarettes  . Smokeless tobacco: Never Used     Comment: Trying to quit - smoking about 2 cigarettes per week.  . Alcohol Use: No  . Drug Use: No  . Sexual Activity: Yes    Birth Control/ Protection: None   Other Topics Concern  . Not on file   Social History Narrative   Lives at home with her mother.   Left-handed.   8-9 cups caffeine per day.    PHYSICAL EXAM   Filed Vitals:   06/06/14 0723  BP: 117/70  Pulse: 75  Height: 5\' 2"  (1.575 m)  Weight: 169 lb (76.658 kg)    Not recorded      Body mass index is 30.9 kg/(m^2).  PHYSICAL EXAMNIATION:  Gen: NAD, conversant, well nourised, obese, well groomed                     Cardiovascular: Regular rate rhythm, no peripheral edema, warm, nontender. Eyes: Conjunctivae clear without exudates or hemorrhage Neck: Supple, no carotid bruise. Pulmonary: Clear to auscultation bilaterally   NEUROLOGICAL EXAM:  MENTAL STATUS: Speech:    Speech is normal; fluent and spontaneous with normal comprehension.  Cognition:    The patient is oriented to person, place, and time;     recent and remote memory intact;     language fluent;     normal attention, concentration,     fund of knowledge.  CRANIAL NERVES: CN II: Visual fields are full to confrontation. Fundoscopic exam is normal with sharp discs and no vascular changes. Venous pulsations are present bilaterally. Pupils are 4 mm and briskly reactive to light. Visual acuity is 20/20 bilaterally. CN III, IV, VI: extraocular movement are normal. No ptosis. CN V: Facial sensation is intact to pinprick in all 3 divisions bilaterally. Corneal responses are intact.  CN VII: She has right upper and lower face weakness, has no right frontalis movement, eye closure weakness, with 5 mm slit while closing her right eye, was able to cover right cornea, minimum right  cheek movement. CN VIII: Hearing is normal to rubbing fingers, bilateral tympanic membranes were intact, there was no rash at external auditory canal, external ear, hearing was intact to bony and air conduction.  CN IX, X: Palate elevates symmetrically. Phonation is normal. CN XI: Head turning and shoulder shrug are intact CN XII: Tongue is midline with normal movements and no atrophy.  MOTOR: There is no pronator drift of out-stretched arms. Muscle bulk and tone are normal. Muscle strength is normal.  REFLEXES: Reflexes are 2+ and symmetric at the biceps, triceps, knees, and ankles. Plantar responses are flexor.  SENSORY: Light touch, pinprick, position sense, and vibration sense are  intact in fingers and toes.  COORDINATION: Rapid alternating movements and fine finger movements are intact. There is no dysmetria on finger-to-nose and heel-knee-shin. There are no abnormal or extraneous movements.   GAIT/STANCE: Posture is normal. Gait is steady with normal steps, base, arm swing, and turning. Heel and toe walking are normal. Tandem gait is normal.  Romberg is absent.  DIAGNOSTIC DATA (LABS, IMAGING, TESTING) - I reviewed patient records, labs, notes, testing and imaging myself where available.  Lab Results  Component Value Date   WBC 17.2* 05/17/2013   HGB 15.5* 05/17/2013   HCT 44.0 05/17/2013   MCV 84.6 05/17/2013   PLT 274 05/17/2013      Component Value Date/Time   NA 137 05/17/2013 2052   K 4.7 05/17/2013 2052   CL 95* 05/17/2013 2052   CO2 28 05/17/2013 2052   GLUCOSE 85 05/17/2013 2052   BUN 10 05/17/2013 2052   CREATININE 0.75 05/17/2013 2052   CALCIUM 9.6 05/17/2013 2052   PROT 7.4 05/17/2013 2052   ALBUMIN 4.2 05/17/2013 2052   AST 15 05/17/2013 2052   ALT 23 05/17/2013 2052   ALKPHOS 104 05/17/2013 2052   BILITOT 0.7 05/17/2013 2052   GFRNONAA >90 05/17/2013 2052   GFRAA >90 05/17/2013 2052   ASSESSMENT AND PLAN  Maria Ellis is a 29 y.o. femalewith  past medical history of hydrocephalus, status post VP shunt, Arnold-Chiari malformation decompression surgery, now presenting with recurrent right Bell's palsy, this is her seventh episode with in 5 years, she has been treated with prednisone, acyclovir,  1, complete evaluation with MRI of the brain with and without contrast 2, record from previous neurologist Dr. Adella HareApplegate 3. Celebrex 100 mg twice a day for pain 4, return to clinic in 3-4 weeks    Levert FeinsteinYijun Jenessa Gillingham, M.D. Ph.D.  [facility] [address], [address] [place] [address] Ph: [phone] Fax: [phone]

## 2014-06-07 ENCOUNTER — Ambulatory Visit
Admission: RE | Admit: 2014-06-07 | Discharge: 2014-06-07 | Disposition: A | Discharge status: Home / Self Care | Payer: Medicaid Other | Source: Ambulatory Visit | Attending: Neurology | Admitting: Neurology

## 2014-06-07 ENCOUNTER — Encounter (INDEPENDENT_AMBULATORY_CARE_PROVIDER_SITE_OTHER): Payer: Medicaid Other | Admitting: Diagnostic Neuroimaging

## 2014-06-07 DIAGNOSIS — R519 Headache, unspecified: Secondary | ICD-10-CM

## 2014-06-07 DIAGNOSIS — G510 Bell's palsy: Secondary | ICD-10-CM | POA: Diagnosis not present

## 2014-06-07 DIAGNOSIS — R51 Headache: Secondary | ICD-10-CM

## 2014-06-07 DIAGNOSIS — G919 Hydrocephalus, unspecified: Secondary | ICD-10-CM

## 2014-06-07 DIAGNOSIS — Z982 Presence of cerebrospinal fluid drainage device: Secondary | ICD-10-CM

## 2014-06-07 MED ORDER — GADOBENATE DIMEGLUMINE 529 MG/ML IV SOLN: 15.0000 mL | Freq: Once | INTRAVENOUS | Status: AC | PRN

## 2014-06-07 NOTE — Telephone Encounter (Signed)
Ins has approved Prior auth for Celebrex, they will cover Brand Name only, not generic.  Auth valid until 12/07/2014.  I called and spoke with the pharmacist who said the Rx did go through ins for $3 co-pay.  I called the patient back to advise, got no answer.  Left message.

## 2014-06-08 ENCOUNTER — Telehealth: Payer: Self-pay | Admitting: Neurology

## 2014-06-08 NOTE — Telephone Encounter (Signed)
Please call patient, MRI brain showed evidence of hydrocephalus, VP shunt placement, no change from CT on July 2015  Abnormal MRI brain (with and without) demonstrating: 1. Right parietal intraventricular shunt catheter with tip near the left foramen of monro. 2. 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 3. No acute findings. 4. No change from CT on 07/26/13.

## 2014-06-08 NOTE — Telephone Encounter (Signed)
Pt aware of results 

## 2014-07-01 ENCOUNTER — Encounter (HOSPITAL_COMMUNITY): Payer: Self-pay | Admitting: Emergency Medicine

## 2014-07-01 ENCOUNTER — Emergency Department (HOSPITAL_COMMUNITY)
Admission: EM | Admit: 2014-07-01 | Discharge: 2014-07-01 | Disposition: A | Discharge status: Home / Self Care | Payer: Medicaid Other | Attending: Emergency Medicine | Admitting: Emergency Medicine

## 2014-07-01 DIAGNOSIS — Z79899 Other long term (current) drug therapy: Secondary | ICD-10-CM | POA: Insufficient documentation

## 2014-07-01 DIAGNOSIS — Z720 Tobacco use: Secondary | ICD-10-CM | POA: Insufficient documentation

## 2014-07-01 DIAGNOSIS — H5711 Ocular pain, right eye: Secondary | ICD-10-CM | POA: Diagnosis present

## 2014-07-01 DIAGNOSIS — Z880 Allergy status to penicillin: Secondary | ICD-10-CM | POA: Diagnosis not present

## 2014-07-01 DIAGNOSIS — Z7951 Long term (current) use of inhaled steroids: Secondary | ICD-10-CM | POA: Diagnosis not present

## 2014-07-01 MED ORDER — TETRACAINE HCL 0.5 % OP SOLN
2.0000 [drp] | Freq: Once | OPHTHALMIC | Status: AC
Start: 2014-07-01 — End: 2014-07-01
  Administered 2014-07-01: 2 [drp] via OPHTHALMIC
  Filled 2014-07-01: qty 2

## 2014-07-01 MED ORDER — FLUORESCEIN SODIUM 1 MG OP STRP
1.0000 | ORAL_STRIP | Freq: Once | OPHTHALMIC | Status: AC
  Administered 2014-07-01: 1 via OPHTHALMIC
  Filled 2014-07-01: qty 1

## 2014-07-01 MED ORDER — ERYTHROMYCIN 5 MG/GM OP OINT: TOPICAL_OINTMENT | OPHTHALMIC | Status: DC

## 2014-07-01 NOTE — ED Provider Notes (Signed)
CSN: [id number]     Arrival date & time 07/01/14  2105 History  This chart was scribed for non-physician practitioner, Elpidio Anis, PA-C,working with Nelva Nay, MD, by Karle Plumber, ED Scribe. This patient was seen in room TR04C/TR04C and the patient's care was started at 9:32 PM.  Chief Complaint  Patient presents with  . Eye Injury    The patient said she was taking carpet up and a piece of wood chipped and a piece of wood went in her right eye.   The history is provided by the patient and medical records. No language interpreter was used.    HPI Comments:  Maria Ellis is a 29 y.o. female who presents to the Emergency Department complaining of getting a piece of wood in her right eye approximately 1.5 hours ago. She states she was pulling up carpet when a piece of wood landed in the eye causing severe pain and redness. She states she flushed the eye repeatedly unsuccessfully. Trying to open the eye makes the pain worse. Denies alleviating factors. Denies loss of vision, drainage or bleeding, nausea or vomiting.  Past Medical History  Diagnosis Date  . Hydrocephalus   . Bell's palsy   . History of Chiari malformation   . Headache    Past Surgical History  Procedure Laterality Date  . Shunt replacement      x 2  . Brain surgery    . Laparoscopic appendectomy Left 05/18/2013    Procedure: APPENDECTOMY LAPAROSCOPIC;  Surgeon: Velora Heckler, MD;  Location: [facility];  Service: General;  Laterality: Left;  Marland Kitchen Multiple tooth extractions    . Tonsilectomy, adenoidectomy, bilateral myringotomy and tubes     Family History  Problem Relation Age of Onset  . Diabetes Mother   . Diabetes Father   . Heart failure Father   . Cirrhosis Father    History  Substance Use Topics  . Smoking status: Current Every Day Smoker    Types: Cigarettes  . Smokeless tobacco: Never Used     Comment: Trying to quit - smoking about 2 cigarettes per week.  . Alcohol Use: No   OB History    Gravida  Para Term Preterm AB TAB SAB Ectopic Multiple Living   1    1  1         Review of Systems  Eyes: Positive for pain and redness. Negative for visual disturbance.  Gastrointestinal: Negative for nausea and vomiting.  Skin: Negative for wound.    Allergies  Multihance; Penicillins; and Tylenol  Home Medications   Prior to Admission medications   Medication Sig Start Date End Date Taking? Authorizing Provider  celecoxib (CELEBREX) 100 MG capsule Take 1 capsule (100 mg total) by mouth 2 (two) times daily. 06/06/14   Levert Feinstein, MD  fluticasone (FLONASE) 50 MCG/ACT nasal spray Place 2 sprays into both nostrils daily.    Historical Provider, MD  ibuprofen (ADVIL,MOTRIN) 600 MG tablet Take 600 mg by mouth every 8 (eight) hours as needed.    Historical Provider, MD  lubiprostone (AMITIZA) 24 MCG capsule Take 24 mcg by mouth 2 (two) times daily with a meal.    Historical Provider, MD  Olopatadine HCl 0.2 % SOLN Apply 2 drops to eye daily.    Historical Provider, MD  ondansetron (ZOFRAN) 8 MG tablet Take 8 mg by mouth every 8 (eight) hours as needed for nausea or vomiting.    Historical Provider, MD  OxyCODONE HCl (ROXICODONE PO) Take 10 mg by  mouth 4 (four) times daily as needed.    Historical Provider, MD  topiramate (TOPAMAX) 100 MG tablet Take 1 tablet (100 mg total) by mouth 2 (two) times daily. 06/06/14   Levert Feinstein, MD   Triage Vitals: Pulse 83  Temp(Src) 98.9 F (37.2 C) (Oral)  Resp 16  Ht  (1.6 m)  Wt 163 lb (73.936 kg)  BMI 28.88 kg/m2  SpO2 97% Physical Exam  Constitutional: She is oriented to person, place, and time. She appears well-developed and well-nourished.  HENT:  Head: Normocephalic and atraumatic.  Eyes: Conjunctivae, EOM and lids are normal. Pupils are equal, round, and reactive to light. Lids are everted and swept, no foreign bodies found.  Slit lamp exam:      The right eye shows no corneal abrasion, no corneal flare, no corneal ulcer, no foreign body, no  hyphema and no fluorescein uptake.  Neck: Normal range of motion.  Cardiovascular: Normal rate.   Pulmonary/Chest: Effort normal.  Musculoskeletal: Normal range of motion.  Neurological: She is alert and oriented to person, place, and time.  Skin: Skin is warm and dry.  Psychiatric: She has a normal mood and affect. Her behavior is normal.  Nursing note and vitals reviewed.   ED Course  Procedures (including critical care time) DIAGNOSTIC STUDIES: Oxygen Saturation is 97% on RA, normal by my interpretation.   COORDINATION OF CARE: 9:36 PM- Will apply tetracaine drops and apply fluorescein strip to perform eye examination. Pt verbalizes understanding and agrees to plan.  9:38 PM- Will prescribe antibiotic ointment and give referral to opthalmology for continued symptoms.  Medications  tetracaine (PONTOCAINE) 0.5 % ophthalmic solution 2 drop (not administered)  fluorescein ophthalmic strip 1 strip (not administered)    Labs Review Labs Reviewed - No data to display  Imaging Review No results found.   EKG Interpretation None      MDM   Final diagnoses:  None    1. Right eye pain  No FBs observed. Negative stain uptake to normal appearing cornea. Will provide eye abx ointment for comfort and to prevent infection. Referral to ophtho prn worsening symptoms.  I personally performed the services described in this documentation, which was scribed in my presence. The recorded information has been reviewed and is accurate.    Elpidio Anis, PA-C 07/01/14 2209  Nelva Nay, MD 07/01/14 [phone]

## 2014-07-01 NOTE — ED Notes (Addendum)
The patient said she was taking carpet up and a piece of wood chipped and a piece of wood went in her right eye.  She irrigated her eye and she felt the splinter go to the back of her eye.  She rates her pain 9/10.

## 2014-07-01 NOTE — Discharge Instructions (Signed)
FOLLOW UP WITH DR. Alvino Chapel IF PAIN PERSISTS OR IF SYMPTOMS WORSEN.

## 2014-07-04 ENCOUNTER — Telehealth: Payer: Self-pay | Admitting: Neurology

## 2014-07-04 NOTE — Telephone Encounter (Signed)
Patient is calling and states she is having tingling and cramping in fingers up to shoulder in both arms and she is in a lot of pain. Please call patient.

## 2014-07-04 NOTE — Telephone Encounter (Signed)
Appointment moved to an earlier date.

## 2014-07-06 ENCOUNTER — Ambulatory Visit (INDEPENDENT_AMBULATORY_CARE_PROVIDER_SITE_OTHER): Payer: Medicaid Other | Admitting: Neurology

## 2014-07-06 ENCOUNTER — Encounter: Payer: Self-pay | Admitting: Neurology

## 2014-07-06 VITALS — BP 127/81 | HR 87 | Ht 63.0 in | Wt 163.0 lb

## 2014-07-06 DIAGNOSIS — G510 Bell's palsy: Secondary | ICD-10-CM

## 2014-07-06 DIAGNOSIS — G919 Hydrocephalus, unspecified: Secondary | ICD-10-CM

## 2014-07-06 DIAGNOSIS — R202 Paresthesia of skin: Secondary | ICD-10-CM

## 2014-07-06 DIAGNOSIS — M542 Cervicalgia: Secondary | ICD-10-CM

## 2014-07-06 DIAGNOSIS — Z982 Presence of cerebrospinal fluid drainage device: Secondary | ICD-10-CM

## 2014-07-06 MED ORDER — NORTRIPTYLINE HCL 10 MG PO CAPS: ORAL_CAPSULE | ORAL | Status: DC

## 2014-07-06 NOTE — Progress Notes (Addendum)
Chief Complaint  Patient presents with  . Hydrocephalus    She would like to discuss her MRI results.  She is concerned about having severe, bilateral arm and hand pain.  Says Celebrex and oxycodone are not helpful.      PATIENT: Maria Ellis DOB: Jul 06, 1985  Chief Complaint  Patient presents with  . Hydrocephalus    She would like to discuss her MRI results.  She is concerned about having severe, bilateral arm and hand pain.  Says Celebrex and oxycodone are not helpful.    HISTORICAL (Initial visit May 2016)  Maria Ellis is a 29 years old left-handed female, referred by her primary care PA [facility] for evaluation of recurrent right side Bell's palsy.  She had a history of hydrocephalus since she was born, had her first VP shunt placement when she was 56 months old. At age 30, she developedleepiness, frequent headaches, nausea and vomiting, was diagnosed with VP shunt blockage, had replacement when she was 29 years old, she has been doing well since then,  In 2014, she developed frequent headaches ago, right arm numbness tingling weakness, was diagnosed with Arnold-Chiari malformation, had decompression surgery at [facility], which has helped her symptoms, but only partially.  Since 2011, over 5 years span, per patient, she has recurrent episodes of right facial weakness, last episode was in 2013, most recent one was in May 30 2014, she woke up noticed right facial weakness, difficulty closing her right eye, sharp pain in her right ear, she was treated with a week course of tapering dose of prednisone, 60 mg, decrement of  every 2 days, also acyclovir 1000 mg 3 times a day for 1 week, she has no significant improvement in her right facial weakness yet, continue have moderate to severe right ear pain, she denies rash broke out, no hearing loss no dizziness, no gait difficulty.  She has been under neurologist Dr. Applegate's care for 15 years, "he does not do what I want him  to do"  Previously, she has tried Neurontin, Lyrica, no help, swelling, Cymbalta, muscle spasm.  She wants some medications for her right ear pain, Currently taking Topamax 100 mg every night as headache prevention, tolerating the medication well, no significant side effect.  UPDATE June 16th 2016: We have reviewed her MRI of the brain in May 2016: right parietal intraventricular shunt catheter with tip near the left foramen of monro. 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  No acute findings. No change from CT on 07/26/13.   Her right facial pain has much improved, but continue have significant right facial weakness, could not close her right eye, there was no rash broke out  Today she is tearful, complains 4 days history of bilateral hands paresthesia, thrombin pain, right hand starting from her right fingers, radiating towards her right shoulder, left side radiating towards her left elbow, significant weakness because of the pain, she denies gait difficulty, no bowel and bladder incontinence.   She has tried different medication over past few days, hydrocodone, Celebrex, gabapentin, over-the-counter NSAIDs fail to improve her hands pain.  Add on nortriptyline, titrating to 10 mg 2 tablets every night for her bilateral hands paresthesia, pain, also for chronic migraine.  REVIEW OF SYSTEMS: Full 14 system review of systems performed and notable only for activity change, eye discharge, eye itching, blurred vision, insomnia, daytime sleep snoring, neck pain, facial droopy, numbness ALLERGIES: Allergies  Allergen Reactions  .  Multihance [Gadobenate] Nausea Only and Cough    PT HAD SNEEZING AND NAUSEA IMMEDIATELY AFTER CONTRAST INJECTION  . Penicillins Swelling  . Tylenol [Acetaminophen] Rash    HOME MEDICATIONS: Current Outpatient Prescriptions  Medication Sig Dispense Refill  . fluticasone (FLONASE) 50 MCG/ACT nasal spray Place 2 sprays into  both nostrils daily.    Marland Kitchen ibuprofen (ADVIL,MOTRIN) 600 MG tablet Take 600 mg by mouth every 8 (eight) hours as needed.    . lubiprostone (AMITIZA) 24 MCG capsule Take 24 mcg by mouth 2 (two) times daily with a meal.    . Olopatadine HCl 0.2 % SOLN Apply 2 drops to eye daily.    . ondansetron (ZOFRAN) 8 MG tablet Take 8 mg by mouth every 8 (eight) hours as needed for nausea or vomiting.    . OxyCODONE HCl (ROXICODONE PO) Take 10 mg by mouth 4 (four) times daily as needed.    . topiramate (TOPAMAX) 100 MG tablet Take 100 mg by mouth at bedtime.      PAST MEDICAL HISTORY: Past Medical History  Diagnosis Date  . Hydrocephalus   . Bell's palsy   . History of Chiari malformation   . Headache     PAST SURGICAL HISTORY: Past Surgical History  Procedure Laterality Date  . Shunt replacement      x 2  . Brain surgery    . Laparoscopic appendectomy Left 05/18/2013    Procedure: APPENDECTOMY LAPAROSCOPIC;  Surgeon: Velora Heckler, MD;  Location: [facility];  Service: General;  Laterality: Left;  Marland Kitchen Multiple tooth extractions    . Tonsilectomy, adenoidectomy, bilateral myringotomy and tubes      FAMILY HISTORY: Family History  Problem Relation Age of Onset  . Diabetes Mother   . Diabetes Father   . Heart failure Father   . Cirrhosis Father     SOCIAL HISTORY:  History   Social History  . Marital Status: Single    Spouse Name: N/A  . Number of Children: 0  . Years of Education: 12   Occupational History  . Disabled    Social History Main Topics  . Smoking status: Current Every Day Smoker    Types: Cigarettes  . Smokeless tobacco: Never Used     Comment: Trying to quit - smoking about 2 cigarettes per week.  . Alcohol Use: No  . Drug Use: No  . Sexual Activity: Yes    Birth Control/ Protection: None   Other Topics Concern  . Not on file   Social History Narrative   Lives at home with her mother.   Left-handed.   8-9 cups caffeine per day.    PHYSICAL EXAM   Filed  Vitals:   07/06/14 1052  BP: 127/81  Pulse: 87  Height:  (1.6 m)  Weight: 163 lb (73.936 kg)    Not recorded      Body mass index is 28.88 kg/(m^2).  PHYSICAL EXAMNIATION:  Gen: NAD, conversant, well nourised, obese, well groomed                     Cardiovascular: Regular rate rhythm, no peripheral edema, warm, nontender. Eyes: Conjunctivae clear without exudates or hemorrhage Neck: Supple, no carotid bruise. Pulmonary: Clear to auscultation bilaterally   NEUROLOGICAL EXAM:  MENTAL STATUS: Speech:    Speech is normal; fluent and spontaneous with normal comprehension.  Cognition:    The patient is oriented to person, place, and time;     recent and remote memory intact;  language fluent;     normal attention, concentration,     fund of knowledge.  CRANIAL NERVES: CN II: Visual fields are full to confrontation. Fundoscopic exam is normal, pupil equal round reactive to light  CN III, IV, VI: extraocular movement are normal. No ptosis. CN V: Facial sensation is intact to pinprick in all 3 divisions bilaterally. Corneal responses are intact.  CN VII: She has right upper and lower face weakness, has no right frontalis movement, eye closure weakness, with 5 mm slit while closing her right eye, was able to cover right cornea, minimum right cheek movement. CN VIII: Hearing is normal to rubbing fingers, bilateral tympanic membranes were intact, there was no rash at external auditory canal, external ear, hearing was intact to bony and air conduction.  CN IX, X: Palate elevates symmetrically. Phonation is normal. CN XI: Head turning and shoulder shrug are intact CN XII: Tongue is midline with normal movements and no atrophy.  MOTOR: There is no pronator drift of out-stretched arms. Muscle bulk and tone are normal. Bilateral handgrip examination is limited by her pain, otherwise no proximal upper extremity or bilateral lower extremity muscle weakness.  REFLEXES: Reflexes  are 2+ and symmetric at the biceps, triceps, knees, and ankles. Plantar responses are flexor.  SENSORY: Light touch, pinprick, position sense, and vibration sense are intact in fingers and toes.  COORDINATION: Rapid alternating movements and fine finger movements are intact. There is no dysmetria on finger-to-nose and heel-knee-shin. There are no abnormal or extraneous movements.   GAIT/STANCE: Posture is normal. Gait is steady with normal steps, base, arm swing, and turning. Heel and toe walking are normal. Tandem gait is normal.  Romberg is absent.  DIAGNOSTIC DATA (LABS, IMAGING, TESTING) - I reviewed patient records, labs, notes, testing and imaging myself where available.  Lab Results  Component Value Date   WBC 17.2* 05/17/2013   HGB 15.5* 05/17/2013   HCT 44.0 05/17/2013   MCV 84.6 05/17/2013   PLT 274 05/17/2013      Component Value Date/Time   NA 137 05/17/2013 2052   K 4.7 05/17/2013 2052   CL 95* 05/17/2013 2052   CO2 28 05/17/2013 2052   GLUCOSE 85 05/17/2013 2052   BUN 10 05/17/2013 2052   CREATININE 0.75 05/17/2013 2052   CALCIUM 9.6 05/17/2013 2052   PROT 7.4 05/17/2013 2052   ALBUMIN 4.2 05/17/2013 2052   AST 15 05/17/2013 2052   ALT 23 05/17/2013 2052   ALKPHOS 104 05/17/2013 2052   BILITOT 0.7 05/17/2013 2052   GFRNONAA >90 05/17/2013 2052   GFRAA >90 05/17/2013 2052   ASSESSMENT AND PLAN  Cypress Leis Palmer is a 29 y.o. femalewith past medical history of hydrocephalus, status post VP shunt, Arnold-Chiari malformation decompression surgery, now presenting with recurrent right Bell's palsy, this is her seventh episode with in 5 years, she has been treated with prednisone, acyclovir,  1, hydrocephalus, status post VP shunts, stable MRI of the brain, 2, right Bell's palsy, still has profound right upper and lower face weakness, could not close her right eye, 3. Bilateral hands paresthesia, chronic neck pain, need to rule out cervical structural lesion, such  as cervical syrinx, cervical radiculopathy, possibility also include bilateral carpal tunnel syndromes, proceed with MRI of cervical spine, EMG nerve conduction study   Levert Feinstein, M.D. Ph.D.  [facility] [address], [address] [place], [place] [address] Ph: [phone] Fax: [phone]

## 2014-07-10 ENCOUNTER — Ambulatory Visit: Payer: Medicaid Other | Admitting: Neurology

## 2014-07-13 ENCOUNTER — Ambulatory Visit: Payer: Self-pay | Admitting: Neurology

## 2014-07-18 ENCOUNTER — Ambulatory Visit (INDEPENDENT_AMBULATORY_CARE_PROVIDER_SITE_OTHER): Payer: Medicaid Other | Admitting: Neurology

## 2014-07-18 ENCOUNTER — Ambulatory Visit (INDEPENDENT_AMBULATORY_CARE_PROVIDER_SITE_OTHER): Payer: Self-pay | Admitting: Neurology

## 2014-07-18 DIAGNOSIS — Z0289 Encounter for other administrative examinations: Secondary | ICD-10-CM

## 2014-07-18 DIAGNOSIS — M542 Cervicalgia: Secondary | ICD-10-CM

## 2014-07-18 DIAGNOSIS — G510 Bell's palsy: Secondary | ICD-10-CM

## 2014-07-18 DIAGNOSIS — R202 Paresthesia of skin: Secondary | ICD-10-CM

## 2014-07-18 DIAGNOSIS — G919 Hydrocephalus, unspecified: Secondary | ICD-10-CM

## 2014-07-18 DIAGNOSIS — Z982 Presence of cerebrospinal fluid drainage device: Secondary | ICD-10-CM

## 2014-07-18 DIAGNOSIS — G5601 Carpal tunnel syndrome, right upper limb: Secondary | ICD-10-CM | POA: Diagnosis not present

## 2014-07-18 NOTE — Procedures (Signed)
   NCS (NERVE CONDUCTION STUDY) WITH EMG (ELECTROMYOGRAPHY) REPORT   STUDY DATE: July 18 2014 PATIENT NAME: Maria Ellis DOB: July 28, 1985 MRN: [id number]    TECHNOLOGIST: Gearldine ShownLorraine Jones ELECTROMYOGRAPHER: Levert FeinsteinYan, Erion Hermans M.D.  CLINICAL INFORMATION:    29 yo RH female presented with right hand paresthesia for few weeks, also complains of right side neck pain. FINDINGS: NERVE CONDUCTION STUDY:  Bilateral ulnar sensory and motor responses were normal.  Left median sensory and motor responses were normal.  Right median sensory response showed mildly prolonged peak latency, with normal SNAP amplitude.  Right median motor response showed mildly prolonged distal latency, with normal CMAP amplitude, conduction velocity.  NEEDLE ELECTROMYOGRAPHY: Selected needle exam of right arm was performed.  She could not tolerate more extensive needle examination, such as needle examination of right cervical paraspinal muscles.  Needled exam of right pronator teres, biceps, triceps, deltoid, extensor digitorum communis were normal.  Right abductor pollicis brevis: normal insertion activities, no spontaneous activities, normal morphology motor unit potential, mildly decreased recruitment pattern.  IMPRESSION:   This is an abnormal study. There is electrodiagnostic evidence of moderate right median neuropathy across the wrist, consistent with moderate right carpal tunnel syndromes.  There was no evidence of right cervical radiculopathy.   INTERPRETING PHYSICIAN:   Levert FeinsteinYan, Rahel Carlton M.D. Ph.D. [facility] [address], [address] [place], [place] [id number] [phone]

## 2014-07-18 NOTE — Progress Notes (Signed)
EMG nerve conduction study today demonstrated moderate right carpal tunnel syndrome.

## 2014-07-21 ENCOUNTER — Ambulatory Visit
Admission: RE | Admit: 2014-07-21 | Discharge: 2014-07-21 | Disposition: A | Discharge status: Home / Self Care | Payer: Medicaid Other | Source: Ambulatory Visit | Attending: Neurology | Admitting: Neurology

## 2014-07-21 DIAGNOSIS — M542 Cervicalgia: Secondary | ICD-10-CM | POA: Diagnosis not present

## 2014-07-21 DIAGNOSIS — R202 Paresthesia of skin: Secondary | ICD-10-CM

## 2014-07-21 DIAGNOSIS — G510 Bell's palsy: Secondary | ICD-10-CM

## 2014-07-21 DIAGNOSIS — Z982 Presence of cerebrospinal fluid drainage device: Secondary | ICD-10-CM

## 2014-07-21 DIAGNOSIS — G919 Hydrocephalus, unspecified: Secondary | ICD-10-CM

## 2014-07-25 ENCOUNTER — Telehealth: Payer: Self-pay | Admitting: Neurology

## 2014-07-25 NOTE — Telephone Encounter (Signed)
Maria Ellis: Please call patient,MRI brain showed evidence of previous Chiari decompression surgery, cervical degenerative disease, no acute findings, will go over detail at her follow up visit.    This is an abnormal MRI of the cervical spine showed the following: 1. Posterior fossa decompression surgery due to prior Chiari I malformation noted on MRI dated 08/05/2012. 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 2. 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 This appears to be more advanced than on the available sagittal only images dated 08/05/2012.

## 2014-07-26 NOTE — Telephone Encounter (Signed)
Left message for a return call

## 2014-07-26 NOTE — Telephone Encounter (Signed)
Patient returned call and requested that Marcelino DusterMichelle RN call her back on [phone]). Please call and advise.

## 2014-07-26 NOTE — Telephone Encounter (Signed)
Maria Ellis aware of results - she will keep her follow up appointment to further discuss.

## 2014-07-31 NOTE — Telephone Encounter (Signed)
She was calling to confirm her appt time on 08/03/14.

## 2014-07-31 NOTE — Telephone Encounter (Addendum)
Patient returned call and [facility]. Please call and advise. [phone])

## 2014-08-03 ENCOUNTER — Encounter: Payer: Self-pay | Admitting: Neurology

## 2014-08-03 ENCOUNTER — Ambulatory Visit (INDEPENDENT_AMBULATORY_CARE_PROVIDER_SITE_OTHER): Payer: Medicaid Other | Admitting: Neurology

## 2014-08-03 VITALS — BP 104/60 | HR 73 | Ht 63.0 in | Wt 160.0 lb

## 2014-08-03 DIAGNOSIS — G510 Bell's palsy: Secondary | ICD-10-CM | POA: Diagnosis not present

## 2014-08-03 DIAGNOSIS — Z982 Presence of cerebrospinal fluid drainage device: Secondary | ICD-10-CM

## 2014-08-03 DIAGNOSIS — G5601 Carpal tunnel syndrome, right upper limb: Secondary | ICD-10-CM | POA: Diagnosis not present

## 2014-08-03 DIAGNOSIS — G919 Hydrocephalus, unspecified: Secondary | ICD-10-CM | POA: Diagnosis not present

## 2014-08-03 DIAGNOSIS — M542 Cervicalgia: Secondary | ICD-10-CM | POA: Diagnosis not present

## 2014-08-03 MED ORDER — RIZATRIPTAN BENZOATE 5 MG PO TBDP: 5.0000 mg | ORAL_TABLET | ORAL | Status: DC | PRN

## 2014-08-03 NOTE — Progress Notes (Signed)
Chief Complaint  Patient presents with  . Hand Paresthesia    She would like to discuss her NCV/EMG results.  She has been wearing her right wrist brace during the day for approximately 6-7 hours per day.  . Bell's Palsy    She is still having some facial weakness.   Chief Complaint  Patient presents with  . Hand Paresthesia    She would like to discuss her NCV/EMG results.  She has been wearing her right wrist brace during the day for approximately 6-7 hours per day.  . Bell's Palsy    She is still having some facial weakness.      PATIENT: Maria Ellis DOB: [birth year]  Chief Complaint  Patient presents with  . Hand Paresthesia    She would like to discuss her NCV/EMG results.  She has been wearing her right wrist brace during the day for approximately 6-7 hours per day.  . Bell's Palsy    She is still having some facial weakness.    HISTORICAL (Initial visit May 2016)  Maria Ellis is a 29 years old left-handed female, referred by her primary care PA [facility] for evaluation of recurrent right side Bell's palsy.  She had a history of hydrocephalus since she was born, had her first VP shunt placement when she was 60 months old. At age 31, she developed sleepiness, frequent headaches, nausea and vomiting, was diagnosed with VP shunt blockage, had replacement when she was 29 years old, she has been doing well since then,  In 2014, she developed frequent headaches again, right arm numbness tingling weakness, was diagnosed with Arnold-Chiari malformation, had decompression surgery at [facility], which has helped her symptoms, but only partially.  Since 2011, over 5 years span, per patient, she has recurrent episodes of right facial weakness, last episode was in 2013, most recent one was in May 30 2014, she woke up noticed right facial weakness, difficulty closing her right eye, sharp pain in her right ear, she was treated with a week course of tapering dose of prednisone,  60 mg, decrement of  every 2 days, also acyclovir 1000 mg 3 times a day for 1 week, she has no significant improvement in her right facial weakness yet, continue have moderate to severe right ear pain, she denies rash broke out, no hearing loss no dizziness, no gait difficulty.  She has been under neurologist Dr. Applegate's care for 15 years, "he does not do what I want him to do"  Previously, she has tried Neurontin, Lyrica, no help, swelling, Cymbalta, muscle spasm.  She wants some medications for her right ear pain, Currently taking Topamax 100 mg every night as headache prevention, tolerating the medication well, no significant side effect.  UPDATE June 16th 2016: We have reviewed her MRI of the brain in May 2016: right parietal intraventricular shunt catheter with tip near the left foramen of monro. 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  No acute findings. No change from CT on 07/26/13.   Her right facial pain has much improved, but continue have significant right facial weakness, could not close her right eye, there was no rash broke out  Today she is tearful, complains 4 days history of bilateral hands paresthesia, thrombin pain, right hand starting from her right fingers, radiating towards her right shoulder, left side radiating towards her left elbow, significant weakness because of the pain, she denies gait difficulty, no bowel and bladder incontinence.  She has tried different medication over past few days, hydrocodone, Celebrex, gabapentin, over-the-counter NSAIDs fail to improve her hands pain.  Add on nortriptyline, titrating to 10 mg 2 tablets every night for her bilateral hands paresthesia, pain, also for chronic migraine.  UPDATE August 03 2014: We have reviewed EMG nerve conduction study result in July 18 2014, she has evidence of moderate right carpal tunnel syndromes, she has wore a right wrist is pain, which has been  helpful  We have reviewed MRI of the cervical spine: Posterior fossa decompression surgery due to prior Chiari I malformation noted on MRI dated 08/05/2012. 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 This appears to be more advanced than on the available sagittal only images dated 08/05/2012.  She is on oxycodone 10mg  daily by her primary care physician, still complains of achy persistent throbbing pain in her lower back, radiating along her spine  She still has profound right facial weakness, difficulty closing her eyes  Her migraine headache has much improved with Topamax, have typical migraines once or twice each week, with light noise sensitivity lasting for a few hours, improved by sleeping dark quiet room.,   REVIEW OF SYSTEMS: Full 14 system review of systems performed and notable only for above  ALLERGIES: Allergies  Allergen Reactions  . Multihance [Gadobenate] Nausea Only and Cough    PT HAD SNEEZING AND NAUSEA IMMEDIATELY AFTER CONTRAST INJECTION  . Penicillins Swelling  . Tylenol [Acetaminophen] Rash    HOME MEDICATIONS: Current Outpatient Prescriptions  Medication Sig Dispense Refill  . fluticasone (FLONASE) 50 MCG/ACT nasal spray Place 2 sprays into both nostrils daily.    Marland Kitchen ibuprofen (ADVIL,MOTRIN) 600 MG tablet Take 600 mg by mouth every 8 (eight) hours as needed.    . lubiprostone (AMITIZA) 24 MCG capsule Take 24 mcg by mouth 2 (two) times daily with a meal.    . Olopatadine HCl 0.2 % SOLN Apply 2 drops to eye daily.    . ondansetron (ZOFRAN) 8 MG tablet Take 8 mg by mouth every 8 (eight) hours as needed for nausea or vomiting.    . OxyCODONE HCl (ROXICODONE PO) Take 10 mg by mouth 4 (four) times daily as needed.    . topiramate (TOPAMAX)  100 MG tablet Take 100 mg by mouth at bedtime.      PAST MEDICAL HISTORY: Past Medical History  Diagnosis Date  . Hydrocephalus   . Bell's palsy   . History of Chiari malformation   . Headache     PAST SURGICAL HISTORY: Past Surgical History  Procedure Laterality Date  . Shunt replacement      x 2  . Brain surgery    . Laparoscopic appendectomy Left 05/18/2013    Procedure: APPENDECTOMY LAPAROSCOPIC;  Surgeon: Velora Heckler, MD;  Location: [facility];  Service: General;  Laterality: Left;  Marland Kitchen Multiple tooth extractions    . Tonsilectomy, adenoidectomy, bilateral myringotomy and tubes      FAMILY HISTORY: Family History  Problem Relation Age of Onset  . Diabetes Mother   . Diabetes Father   . Heart failure Father   . Cirrhosis Father     SOCIAL HISTORY:  History   Social History  . Marital Status: Single    Spouse Name: N/A  . Number of Children: 0  .  Years of Education: 12   Occupational History  . Disabled    Social History Main Topics  . Smoking status: Current Every Day Smoker    Types: Cigarettes  . Smokeless tobacco: Never Used     Comment: Trying to quit - smoking about 2 cigarettes per week.  . Alcohol Use: No  . Drug Use: No  . Sexual Activity: Yes    Birth Control/ Protection: None   Other Topics Concern  . Not on file   Social History Narrative   Lives at home with her mother.   Left-handed.   8-9 cups caffeine per day.    PHYSICAL EXAM   Filed Vitals:   08/03/14 1106  BP: 104/60  Pulse: 73  Height: 5\' 3"  (1.6 m)  Weight: 160 lb (72.576 kg)    Not recorded      Body mass index is 28.35 kg/(m^2).  PHYSICAL EXAMNIATION:  Gen: NAD, conversant, well nourised, obese, well groomed                     Cardiovascular: Regular rate rhythm, no peripheral edema, warm, nontender. Eyes: Conjunctivae clear without exudates or hemorrhage Neck: Supple, no carotid bruise. Pulmonary: Clear to auscultation bilaterally   NEUROLOGICAL  EXAM:  MENTAL STATUS: Speech:    Speech is normal; fluent and spontaneous with normal comprehension.  Cognition:    The patient is oriented to person, place, and time;     recent and remote memory intact;     language fluent;     normal attention, concentration,     fund of knowledge.  CRANIAL NERVES: CN II: Visual fields are full to confrontation. Fundoscopic exam is normal, pupil equal round reactive to light  CN III, IV, VI: extraocular movement are normal. No ptosis. CN V: Facial sensation is intact to pinprick in all 3 divisions bilaterally. Corneal responses are intact.  CN VII: She has right upper and lower face weakness, has limited right frontalis movement, significant right eye closure weakness, with 5 mm slit while closing her right eye, was able to cover right cornea, minimum right cheek movement. CN VIII: Hearing is normal to rubbing fingers, bilateral tympanic membranes were intact, there was no rash at external auditory canal, external ear, hearing was intact to bony and air conduction.  CN IX, X: Palate elevates symmetrically. Phonation is normal. CN XI: Head turning and shoulder shrug are intact CN XII: Tongue is midline with normal movements and no atrophy.  MOTOR: There is no pronator drift of out-stretched arms. Muscle bulk and tone are normal. No significant muscle weakness REFLEXES: Reflexes are 2+ and symmetric at the biceps, triceps, knees, and ankles. Plantar responses are flexor.  SENSORY: Light touch, pinprick, position sense, and vibration sense are intact in fingers and toes.  COORDINATION: Rapid alternating movements and fine finger movements are intact. There is no dysmetria on finger-to-nose and heel-knee-shin. There are no abnormal or extraneous movements.   GAIT/STANCE: Posture is normal. Gait is steady with normal steps, base, arm swing, and turning. Heel and toe walking are normal. Tandem gait is normal.  Romberg is absent.  DIAGNOSTIC DATA  (LABS, IMAGING, TESTING) - I reviewed patient records, labs, notes, testing and imaging myself where available.  Lab Results  Component Value Date   WBC 17.2* 05/17/2013   HGB 15.5* 05/17/2013   HCT 44.0 05/17/2013   MCV 84.6 05/17/2013   PLT 274 05/17/2013      Component Value Date/Time   NA 137  05/17/2013 2052   K 4.7 05/17/2013 2052   CL 95* 05/17/2013 2052   CO2 28 05/17/2013 2052   GLUCOSE 85 05/17/2013 2052   BUN 10 05/17/2013 2052   CREATININE 0.75 05/17/2013 2052   CALCIUM 9.6 05/17/2013 2052   PROT 7.4 05/17/2013 2052   ALBUMIN 4.2 05/17/2013 2052   AST 15 05/17/2013 2052   ALT 23 05/17/2013 2052   ALKPHOS 104 05/17/2013 2052   BILITOT 0.7 05/17/2013 2052   GFRNONAA >90 05/17/2013 2052   GFRAA >90 05/17/2013 2052   ASSESSMENT AND PLAN  Maria Ellis is a 29 y.o. female with past medical history of hydrocephalus, status post VP shunt, Arnold-Chiari malformation decompression surgery, now presenting with recurrent right Bell's palsy, this is her seventh episode with in 5 years, she has been treated with prednisone, acyclovir,  1, Hydrocephalus, status post VP shunts, stable MRI of the brain, 2, Right Bell's palsy, still has profound right upper and lower face weakness, could not close her right eye, prn artifical tear, 3. Bilateral hands paresthesia, chronic neck pain, low back pain, evidence of moderate right carpal tunnel, we have reviewed MRI of cervical spine, multilevel degenerative disc disease, most severe at C4-5, with moderate right foraminal stenosis, no severe canal stenosis, she is on chronic opiates, will refer her to pain management 4. Chronic migraine, improved with Topamax, Maxalt as needed  Levert Feinstein, M.D. Ph.D.  [facility] [address], [address] [place], [place] [address] Ph: [phone] Fax: [phone]

## 2015-02-05 ENCOUNTER — Telehealth: Payer: Self-pay

## 2015-02-05 ENCOUNTER — Ambulatory Visit: Payer: Medicaid Other | Admitting: Neurology

## 2015-02-05 NOTE — Telephone Encounter (Signed)
Pt did not show for their appt with Dr. Yan today.  

## 2015-02-06 ENCOUNTER — Encounter: Payer: Self-pay | Admitting: Neurology

## 2015-04-18 ENCOUNTER — Emergency Department (HOSPITAL_COMMUNITY)
Admission: EM | Admit: 2015-04-18 | Discharge: 2015-04-19 | Disposition: A | Discharge status: Home / Self Care | Payer: Medicaid Other | Attending: Emergency Medicine | Admitting: Emergency Medicine

## 2015-04-18 ENCOUNTER — Encounter (HOSPITAL_COMMUNITY): Payer: Self-pay

## 2015-04-18 DIAGNOSIS — O99330 Smoking (tobacco) complicating pregnancy, unspecified trimester: Secondary | ICD-10-CM | POA: Diagnosis not present

## 2015-04-18 DIAGNOSIS — R109 Unspecified abdominal pain: Secondary | ICD-10-CM | POA: Diagnosis not present

## 2015-04-18 DIAGNOSIS — Z3A00 Weeks of gestation of pregnancy not specified: Secondary | ICD-10-CM | POA: Insufficient documentation

## 2015-04-18 DIAGNOSIS — O9989 Other specified diseases and conditions complicating pregnancy, childbirth and the puerperium: Secondary | ICD-10-CM | POA: Insufficient documentation

## 2015-04-18 DIAGNOSIS — F17210 Nicotine dependence, cigarettes, uncomplicated: Secondary | ICD-10-CM | POA: Diagnosis not present

## 2015-04-18 LAB — URINE MICROSCOPIC-ADD ON
Bacteria, UA: NONE SEEN
WBC, UA: NONE SEEN WBC/hpf (ref 0–5)

## 2015-04-18 LAB — CBC
HCT: 40.6 % (ref 36.0–46.0)
HEMOGLOBIN: 14.1 g/dL (ref 12.0–15.0)
MCH: 28.8 pg (ref 26.0–34.0)
MCHC: 34.7 g/dL (ref 30.0–36.0)
MCV: 83 fL (ref 78.0–100.0)
Platelets: 314 10*3/uL (ref 150–400)
RBC: 4.89 MIL/uL (ref 3.87–5.11)
RDW: 13.1 % (ref 11.5–15.5)
WBC: 12.2 10*3/uL — ABNORMAL HIGH (ref 4.0–10.5)

## 2015-04-18 LAB — COMPREHENSIVE METABOLIC PANEL
ALBUMIN: 4.4 g/dL (ref 3.5–5.0)
ALK PHOS: 82 U/L (ref 38–126)
ALT: 29 U/L (ref 14–54)
AST: 21 U/L (ref 15–41)
Anion gap: 10 (ref 5–15)
BILIRUBIN TOTAL: 0.6 mg/dL (ref 0.3–1.2)
BUN: 8 mg/dL (ref 6–20)
CALCIUM: 9.2 mg/dL (ref 8.9–10.3)
CO2: 25 mmol/L (ref 22–32)
CREATININE: 0.59 mg/dL (ref 0.44–1.00)
Chloride: 104 mmol/L (ref 101–111)
GFR calc non Af Amer: >60 mL/min (ref >60)
GLUCOSE: 94 mg/dL (ref 65–99)
Potassium: 3.5 mmol/L (ref 3.5–5.1)
Sodium: 139 mmol/L (ref 135–145)
TOTAL PROTEIN: 7.3 g/dL (ref 6.5–8.1)

## 2015-04-18 LAB — I-STAT BETA HCG BLOOD, ED (MC, WL, AP ONLY): I-stat hCG, quantitative: <5 m[IU]/mL (ref <5)

## 2015-04-18 LAB — URINALYSIS, ROUTINE W REFLEX MICROSCOPIC
Bilirubin Urine: NEGATIVE
GLUCOSE, UA: NEGATIVE mg/dL
Ketones, ur: NEGATIVE mg/dL
Leukocytes, UA: NEGATIVE
Nitrite: NEGATIVE
Protein, ur: NEGATIVE mg/dL
SPECIFIC GRAVITY, URINE: 1.008 (ref 1.005–1.030)
pH: 5.5 (ref 5.0–8.0)

## 2015-04-18 LAB — POC URINE PREG, ED: Preg Test, Ur: NEGATIVE

## 2015-04-18 LAB — LIPASE, BLOOD: Lipase: 41 U/L (ref 11–51)

## 2015-04-18 NOTE — ED Notes (Addendum)
Called x 3 w/o answer.  

## 2015-04-18 NOTE — ED Notes (Signed)
In December pt had normal period.  Pt unsure of bleeding cycle since then.  Pt states she tested positive with home pregnancy yesterday.  Pt was having soreness in breast which made her test for pregnancy.  Pt has also had some brown discharge starting on/off for 2 weeks.  Previous miscarriages.  No ectopic.  Pain in lower mid area.  Not specific to one side.

## 2015-12-05 DIAGNOSIS — Z0271 Encounter for disability determination: Secondary | ICD-10-CM

## 2016-02-13 ENCOUNTER — Telehealth: Payer: Self-pay | Admitting: *Deleted

## 2016-02-13 NOTE — Telephone Encounter (Signed)
Spoke to patient - she has been having problems with intermittent swelling for the last year.  Her PCP has been treating her for this condition (she has been on Lasix).   She will contact her PCP office today.  If she is unable to be seen today, I have recommended she proceed to the ED for any breathing difficulty or worsening of swelling.  She verbalized understanding.

## 2016-02-13 NOTE — Telephone Encounter (Signed)
Patient was [facility] in [place] for swelling in hands and feet and stomach, they instructed her to contact Primary Care or Neurologist and get an appointment .  Dr Terrace ArabiaYan next opening not until April , pat wants to see md now.  [phone] is pt number

## 2016-04-17 ENCOUNTER — Encounter (HOSPITAL_COMMUNITY): Payer: Self-pay | Admitting: *Deleted

## 2016-04-17 ENCOUNTER — Inpatient Hospital Stay (HOSPITAL_COMMUNITY)
Admission: AD | Admit: 2016-04-17 | Discharge: 2016-04-17 | Disposition: A | Discharge status: Home / Self Care | Payer: Medicare Other | Source: Ambulatory Visit | Attending: Obstetrics and Gynecology | Admitting: Obstetrics and Gynecology

## 2016-04-17 DIAGNOSIS — F17210 Nicotine dependence, cigarettes, uncomplicated: Secondary | ICD-10-CM | POA: Insufficient documentation

## 2016-04-17 DIAGNOSIS — Z8249 Family history of ischemic heart disease and other diseases of the circulatory system: Secondary | ICD-10-CM | POA: Diagnosis not present

## 2016-04-17 DIAGNOSIS — Z79899 Other long term (current) drug therapy: Secondary | ICD-10-CM | POA: Insufficient documentation

## 2016-04-17 DIAGNOSIS — Z79891 Long term (current) use of opiate analgesic: Secondary | ICD-10-CM | POA: Diagnosis not present

## 2016-04-17 DIAGNOSIS — Z9889 Other specified postprocedural states: Secondary | ICD-10-CM | POA: Diagnosis not present

## 2016-04-17 DIAGNOSIS — Z880 Allergy status to penicillin: Secondary | ICD-10-CM | POA: Insufficient documentation

## 2016-04-17 DIAGNOSIS — K6289 Other specified diseases of anus and rectum: Secondary | ICD-10-CM | POA: Diagnosis not present

## 2016-04-17 DIAGNOSIS — R52 Pain, unspecified: Secondary | ICD-10-CM | POA: Diagnosis not present

## 2016-04-17 DIAGNOSIS — R229 Localized swelling, mass and lump, unspecified: Secondary | ICD-10-CM | POA: Insufficient documentation

## 2016-04-17 DIAGNOSIS — Z833 Family history of diabetes mellitus: Secondary | ICD-10-CM | POA: Diagnosis not present

## 2016-04-17 DIAGNOSIS — K629 Disease of anus and rectum, unspecified: Secondary | ICD-10-CM | POA: Diagnosis not present

## 2016-04-17 MED ORDER — KETOROLAC TROMETHAMINE 60 MG/2ML IM SOLN
60.0000 mg | Freq: Once | INTRAMUSCULAR | Status: AC
  Administered 2016-04-17: 60 mg via INTRAMUSCULAR
  Filled 2016-04-17: qty 2

## 2016-04-17 MED ORDER — ONDANSETRON 4 MG PO TBDP
4.0000 mg | ORAL_TABLET | Freq: Once | ORAL | Status: AC
  Administered 2016-04-17: 4 mg via ORAL
  Filled 2016-04-17: qty 1

## 2016-04-17 NOTE — MAU Provider Note (Signed)
Chief Complaint:  Hemorrhoids   First Provider Initiated Contact with Patient 04/17/16 1733       HPI: Maria Ellis is a 31 y.o. G1P0010 who presents to maternity admissions reporting severe pain and swelling in anal area.  States was seen day before yesterday at [place] and told it was a hemorrhoid. States medicare would not cover the Rx cream.  States it is twice as big today and more painful.  States had had an abscess before and had it "lanced".  Thinks this is the same thing. . She reports no vaginal bleeding, vaginal itching/burning, urinary symptoms, h/a, dizziness, n/v, or fever/chills.    The history is provided by the patient. No language interpreter was used.  Wound Check   She was treated in the ED 2 to 3 days ago. Prior ED Treatment: exam of rectal swelling. Treatments since wound repair include soaks. Her temperature was unmeasured prior to arrival. There has been no drainage from the wound. The redness has worsened. The swelling has worsened. The pain has worsened. There is difficulty moving the extremity or digit due to pain.    Past Medical History: Past Medical History:  Diagnosis Date  . Bell's palsy   . Headache   . History of Chiari malformation   . Hydrocephalus     Past obstetric history: OB History  Gravida Para Term Preterm AB Living  1       1    SAB TAB Ectopic Multiple Live Births  1            # Outcome Date GA Lbr Len/2nd Weight Sex Delivery Anes PTL Lv  1 SAB               Past Surgical History: Past Surgical History:  Procedure Laterality Date  . BRAIN SURGERY    . LAPAROSCOPIC APPENDECTOMY Left 05/18/2013   Procedure: APPENDECTOMY LAPAROSCOPIC;  Surgeon: Velora Heckler, MD;  Location: [facility];  Service: General;  Laterality: Left;  Marland Kitchen MULTIPLE TOOTH EXTRACTIONS    . SHUNT REPLACEMENT     x 2  . TONSILECTOMY, ADENOIDECTOMY, BILATERAL MYRINGOTOMY AND TUBES      Family History: Family History  Problem Relation Age of Onset  . Diabetes  Mother   . Diabetes Father   . Heart failure Father   . Cirrhosis Father     Social History: Social History  Substance Use Topics  . Smoking status: Current Every Day Smoker    Types: Cigarettes  . Smokeless tobacco: Never Used     Comment: Trying to quit - smoking about 2 cigarettes per week.  . Alcohol use No    Allergies:  Allergies  Allergen Reactions  . Multihance [Gadobenate] Nausea Only and Cough    PT HAD SNEEZING AND NAUSEA IMMEDIATELY AFTER CONTRAST INJECTION  . Penicillins Swelling  . Tylenol [Acetaminophen] Rash    Meds:  Prescriptions Prior to Admission  Medication Sig Dispense Refill Last Dose  . celecoxib (CELEBREX) 100 MG capsule Take 1 capsule (100 mg total) by mouth 2 (two) times daily. 60 capsule 6 Taking  . erythromycin ophthalmic ointment Place a 1/2 inch ribbon of ointment into the lower eyelid TID x 5 days 1 g 0 Taking  . fluticasone (FLONASE) 50 MCG/ACT nasal spray Place 2 sprays into both nostrils daily.   Taking  . nortriptyline (PAMELOR) 10 MG capsule One po qhs xone week, then 2 tabs po qhs 60 capsule 3 Taking  . Olopatadine HCl 0.2 % SOLN  Apply 2 drops to eye daily.   Taking  . ondansetron (ZOFRAN) 8 MG tablet Take 8 mg by mouth every 8 (eight) hours as needed for nausea or vomiting.   Taking  . OxyCODONE HCl (ROXICODONE PO) Take 10 mg by mouth 4 (four) times daily as needed.   Taking  . rizatriptan (MAXALT-MLT) 5 MG disintegrating tablet Take 1 tablet (5 mg total) by mouth as needed. May repeat in 2 hours if needed 15 tablet 6   . topiramate (TOPAMAX) 100 MG tablet Take 1 tablet (100 mg total) by mouth 2 (two) times daily. 60 tablet 6 Taking    I have reviewed patient's Past Medical Hx, Surgical Hx, Family Hx, Social Hx, medications and allergies.  ROS:  Review of Systems  Constitutional: Negative for chills and fever.  Respiratory: Negative for shortness of breath.   Gastrointestinal: Positive for nausea. Negative for abdominal pain,  constipation, diarrhea and vomiting.  Genitourinary: Negative for dysuria, pelvic pain and vaginal bleeding.       Rectal swelling and pain   Other systems negative     Physical Exam  Patient Vitals for the past 24 hrs:  BP Temp Pulse Resp  04/17/16 1727 (!) 93/51 99.1 F (37.3 C) (!) 110 18   Constitutional: Well-developed, well-nourished female in no acute distress.  Cardiovascular: normal rate and rhythm, no ectopy audible, S1 & S2 heard, no murmur Respiratory: normal effort, no distress. Lungs CTAB with no wheezes or crackles GI: Abd soft, non-tender.  Nondistended.  No rebound, No guarding.  Bowel Sounds audible  MS: Extremities nontender, no edema, normal ROM Neurologic: Alert and oriented x 4.   Grossly nonfocal. GU: Neg CVAT.  Rectal:   There is a large anal swelling, about 4x2cm, firm, exquisitely tender.  Some erethema surrounding it.  Does not appear purple like a hemorrhoid.  Adjacent to anus. Unclear to where it is arising from.   Skin:  Warm and Dry Psych:  Affect appropriate.  PELVIC EXAM: Deferred   Labs: No results found for this or any previous visit (from the past 24 hour(s)).  Imaging:  No results found.  MAU Course/MDM: I have ordered labs as follows: none  Imaging ordered: none    Treatments in MAU included Toradol for pain and Zofran for nausea.   Discussed we do not have personnel to do I&D of abscess.  Will medicate for pain and recommend she go to ED for eval by surgeon.  Did feel a little better after Toradol.   .   Pt stable at time of discharge.  Assessment: Anal swelling, probable perirectal abscess  Plan: Discharge home Recommend go to ED for evaluation and treatment  Encouraged to return here or to other Urgent Care/ED if she develops worsening of symptoms, increase in pain, fever, or other concerning symptoms.   Wynelle BourgeoisMarie Arron Mcnaught CNM, MSN Certified Nurse-Midwife 04/17/2016 5:33 PM

## 2016-04-17 NOTE — MAU Note (Signed)
Urine sent to lab 

## 2016-04-17 NOTE — Discharge Instructions (Signed)
Perirectal Abscess An abscess is an infected area that contains a collection of pus. A perirectal abscess is an abscess that is near the opening of the anus or around the rectum. A perirectal abscess can cause a lot of pain, especially during bowel movements. What are the causes? This condition is almost always caused by an infection that starts in an anal gland. What increases the risk? This condition is more likely to develop in:  People with diabetes or inflammatory bowel disease.  People whose body defense system (immune system) is weak.  People who have anal sex.  People who have a sexually transmitted disease (STD).  People who have certain kinds of cancers, such as rectal carcinoma, leukemia, or lymphoma. What are the signs or symptoms? The main symptom of this condition is pain. The pain may be a throbbing pain that gets worse during bowel movements. Other symptoms include:  Fever.  Swelling.  Redness.  Bleeding.  Constipation. How is this diagnosed? The condition is diagnosed with a physical exam. If the abscess is not visible, a health care provider may need to place a finger inside the rectum to find the abscess. Sometimes, imaging tests are done to determine the size and location of the abscess. These tests may include:  An ultrasound.  An MRI.  A CT scan. How is this treated? This condition is usually treated with incision and drainage surgery. Incision and drainage surgery involves making an incision over the abscess to drain the pus. Treatment may also involve antibiotic medicine, pain medicine, stool softeners, or laxatives. Follow these instructions at home:  Take medicines only as directed by your health care provider.  If you were prescribed an antibiotic, finish all of it even if you start to feel better.  To relieve pain, try sitting:  In a warm, shallow bath (sitz bath).  On a heating pad with the setting on low.  On an inflatable donut-shaped  cushion.  Follow any diet instructions as directed by your health care provider.  Keep all follow-up visits as directed by your health care provider. This is important. Contact a health care provider if:  Your abscess is bleeding.  You have pain, swelling, or redness that is getting worse.  You are constipated.  You feel ill.  You have muscle aches or chills.  You have a fever.  Your symptoms return after the abscess has healed. This information is not intended to replace advice given to you by your health care provider. Make sure you discuss any questions you have with your health care provider. Document Released: 01/04/2000 Document Revised: 06/14/2015 Document Reviewed: 11/16/2013 Elsevier Interactive Patient Education  2017 [employer]  

## 2016-04-17 NOTE — MAU Note (Signed)
Pt stated she started feeling like her hem iod was getting worse 4 days ago. [facility] told her it was a hemmoriod. Insurance will not cover the cream she was prescribed. Prep H did not help. Pt thinks she has some kind of abscess as well as the Hemorid.

## 2016-04-18 ENCOUNTER — Emergency Department (HOSPITAL_COMMUNITY): Payer: Medicare Other

## 2016-04-18 ENCOUNTER — Emergency Department (HOSPITAL_COMMUNITY)
Admission: EM | Admit: 2016-04-18 | Discharge: 2016-04-18 | Disposition: A | Discharge status: Home / Self Care | Payer: Medicare Other | Attending: Emergency Medicine | Admitting: Emergency Medicine

## 2016-04-18 ENCOUNTER — Encounter (HOSPITAL_COMMUNITY): Payer: Self-pay | Admitting: Emergency Medicine

## 2016-04-18 DIAGNOSIS — F17210 Nicotine dependence, cigarettes, uncomplicated: Secondary | ICD-10-CM | POA: Insufficient documentation

## 2016-04-18 DIAGNOSIS — K645 Perianal venous thrombosis: Secondary | ICD-10-CM

## 2016-04-18 DIAGNOSIS — K611 Rectal abscess: Secondary | ICD-10-CM | POA: Diagnosis present

## 2016-04-18 LAB — CBC WITH DIFFERENTIAL/PLATELET
Basophils Absolute: 0 10*3/uL (ref 0.0–0.1)
Basophils Relative: 0 %
Eosinophils Absolute: 0.1 10*3/uL (ref 0.0–0.7)
Eosinophils Relative: 1 %
HCT: 42.1 % (ref 36.0–46.0)
HEMOGLOBIN: 14.8 g/dL (ref 12.0–15.0)
Lymphocytes Relative: 16 %
Lymphs Abs: 2.5 10*3/uL (ref 0.7–4.0)
MCH: 29.4 pg (ref 26.0–34.0)
MCHC: 35.2 g/dL (ref 30.0–36.0)
MCV: 83.7 fL (ref 78.0–100.0)
MONO ABS: 1.4 10*3/uL — AB (ref 0.1–1.0)
Monocytes Relative: 9 %
NEUTROS ABS: 11.1 10*3/uL — AB (ref 1.7–7.7)
Neutrophils Relative %: 74 %
Platelets: 287 10*3/uL (ref 150–400)
RBC: 5.03 MIL/uL (ref 3.87–5.11)
RDW: 12.9 % (ref 11.5–15.5)
WBC: 15.1 10*3/uL — ABNORMAL HIGH (ref 4.0–10.5)

## 2016-04-18 LAB — BASIC METABOLIC PANEL
Anion gap: 10 (ref 5–15)
BUN: 9 mg/dL (ref 6–20)
CHLORIDE: 103 mmol/L (ref 101–111)
CO2: 24 mmol/L (ref 22–32)
CREATININE: 0.61 mg/dL (ref 0.44–1.00)
Calcium: 9.4 mg/dL (ref 8.9–10.3)
GFR calc Af Amer: >60 mL/min (ref >60)
GFR calc non Af Amer: >60 mL/min (ref >60)
Glucose, Bld: 110 mg/dL — ABNORMAL HIGH (ref 65–99)
Potassium: 3.1 mmol/L — ABNORMAL LOW (ref 3.5–5.1)
Sodium: 137 mmol/L (ref 135–145)

## 2016-04-18 MED ORDER — ONDANSETRON HCL 4 MG/2ML IJ SOLN
4.0000 mg | Freq: Once | INTRAMUSCULAR | Status: AC
  Administered 2016-04-18: 4 mg via INTRAVENOUS
  Filled 2016-04-18: qty 2

## 2016-04-18 MED ORDER — TRAMADOL HCL 50 MG PO TABS: 50.0000 mg | ORAL_TABLET | Freq: Four times a day (QID) | ORAL | 0 refills | Status: DC | PRN

## 2016-04-18 MED ORDER — KETOROLAC TROMETHAMINE 30 MG/ML IJ SOLN
30.0000 mg | Freq: Once | INTRAMUSCULAR | Status: AC
  Administered 2016-04-18: 30 mg via INTRAVENOUS
  Filled 2016-04-18: qty 1

## 2016-04-18 MED ORDER — POLYETHYLENE GLYCOL 3350 17 G PO PACK: 17.0000 g | PACK | Freq: Every day | ORAL | 0 refills | Status: DC

## 2016-04-18 MED ORDER — LIDOCAINE 5 % EX OINT: 1.0000 "application " | TOPICAL_OINTMENT | CUTANEOUS | 0 refills | Status: DC | PRN

## 2016-04-18 MED ORDER — SODIUM CHLORIDE 0.9 % IV BOLUS (SEPSIS)
1000.0000 mL | Freq: Once | INTRAVENOUS | Status: AC
  Administered 2016-04-18: 1000 mL via INTRAVENOUS

## 2016-04-18 MED ORDER — HYDROCORTISONE 0.5 % EX CREA: 1.0000 "application " | TOPICAL_CREAM | Freq: Two times a day (BID) | CUTANEOUS | 0 refills | Status: DC

## 2016-04-18 MED ORDER — MORPHINE SULFATE (PF) 4 MG/ML IV SOLN
4.0000 mg | Freq: Once | INTRAVENOUS | Status: AC
  Administered 2016-04-18: 4 mg via INTRAVENOUS
  Filled 2016-04-18: qty 1

## 2016-04-18 MED ORDER — MORPHINE SULFATE (PF) 4 MG/ML IV SOLN
4.0000 mg | Freq: Once | INTRAVENOUS | Status: DC
  Filled 2016-04-18: qty 1

## 2016-04-18 NOTE — ED Triage Notes (Signed)
Per pt, states she was diagnosed with an abscess yesterday-states she noticed it the beginning of the week

## 2016-04-18 NOTE — Consult Note (Signed)
Reason for Consult: anal pain Referring Physician: Dr London Pepper Maria Ellis is an 31 y.o. female.  HPI: 31 y.o. F with a 3-4 day h/o rectal pain.  She states that she was told she had a perirectal abscess.  This began to drain ~2 days ago.  She also noted another more central mass.  It is painful to touch.  She denies any bloody stools.    Past Medical History:  Diagnosis Date  . Bell's palsy   . Headache   . History of Chiari malformation   . Hydrocephalus     Past Surgical History:  Procedure Laterality Date  . BRAIN SURGERY    . LAPAROSCOPIC APPENDECTOMY Left 05/18/2013   Procedure: APPENDECTOMY LAPAROSCOPIC;  Surgeon: Earnstine Regal, MD;  Location: [facility];  Service: General;  Laterality: Left;  Marland Kitchen MULTIPLE TOOTH EXTRACTIONS    . SHUNT REPLACEMENT     x 2  . TONSILECTOMY, ADENOIDECTOMY, BILATERAL MYRINGOTOMY AND TUBES      Family History  Problem Relation Age of Onset  . Diabetes Mother   . Diabetes Father   . Heart failure Father   . Cirrhosis Father     Social History:  reports that she has been smoking Cigarettes.  She has never used smokeless tobacco. She reports that she does not drink alcohol or use drugs.  Allergies:  Allergies  Allergen Reactions  . Multihance [Gadobenate] Nausea Only and Cough    PT HAD SNEEZING AND NAUSEA IMMEDIATELY AFTER CONTRAST INJECTION  . Penicillins Swelling    Has patient had a PCN reaction causing immediate rash, facial/tongue/throat swelling, SOB or lightheadedness with hypotension: No Has patient had a PCN reaction causing severe rash involving mucus membranes or skin necrosis: Yes Has patient had a PCN reaction that required hospitalization No Has patient had a PCN reaction occurring within the last 10 years: Yes If all of the above answers are "NO", then may proceed with Cephalosporin use.   . Tylenol [Acetaminophen] Rash    Medications: I have reviewed the patient's current medications.  Results for orders placed or performed  during the hospital encounter of 04/18/16 (from the past 48 hour(s))  CBC with Differential/Platelet     Status: Abnormal   Collection Time: 04/18/16 10:33 AM  Result Value Ref Range   WBC 15.1 (H) 4.0 - 10.5 K/uL   RBC 5.03 3.87 - 5.11 MIL/uL   Hemoglobin 14.8 12.0 - 15.0 g/dL   HCT 42.1 36.0 - 46.0 %   MCV 83.7 78.0 - 100.0 fL   MCH 29.4 26.0 - 34.0 pg   MCHC 35.2 30.0 - 36.0 g/dL   RDW 12.9 11.5 - 15.5 %   Platelets 287 150 - 400 K/uL   Neutrophils Relative % 74 %   Neutro Abs 11.1 (H) 1.7 - 7.7 K/uL   Lymphocytes Relative 16 %   Lymphs Abs 2.5 0.7 - 4.0 K/uL   Monocytes Relative 9 %   Monocytes Absolute 1.4 (H) 0.1 - 1.0 K/uL   Eosinophils Relative 1 %   Eosinophils Absolute 0.1 0.0 - 0.7 K/uL   Basophils Relative 0 %   Basophils Absolute 0.0 0.0 - 0.1 K/uL  Basic metabolic panel     Status: Abnormal   Collection Time: 04/18/16 10:33 AM  Result Value Ref Range   Sodium 137 135 - 145 mmol/L   Potassium 3.1 (L) 3.5 - 5.1 mmol/L   Chloride 103 101 - 111 mmol/L   CO2 24 22 - 32 mmol/L  Glucose, Bld 110 (H) 65 - 99 mg/dL   BUN 9 6 - 20 mg/dL   Creatinine, Ser 0.61 0.44 - 1.00 mg/dL   Calcium 9.4 8.9 - 10.3 mg/dL   GFR calc non Af Amer >60 >60 mL/min   GFR calc Af Amer >60 >60 mL/min    Comment: (NOTE) The eGFR has been calculated using the CKD EPI equation. This calculation has not been validated in all clinical situations. eGFR's persistently <60 mL/min signify possible Chronic Kidney Disease.    Anion gap 10 5 - 15    Ct Pelvis Wo Contrast  Result Date: 04/18/2016 CLINICAL DATA:  Rectal abscess, rectal pain for 1 week EXAM: CT PELVIS WITHOUT CONTRAST TECHNIQUE: Multidetector CT imaging of the pelvis was performed following the standard protocol without intravenous contrast. COMPARISON:  None. FINDINGS: Urinary Tract: The study is markedly limited without IV contrast. Nonspecific mild thickening of the wall of under distended urinary bladder. Mild cystitis cannot be  excluded. Bilateral distal ureter is unremarkable. No calcified calculi are noted within urinary bladder. Bowel: The patient is status post appendectomy. No pericecal inflammation. Some colonic gas noted within distal sigmoid colon and rectum. No evidence of colitis or diverticulitis on this unenhanced scan. Vascular/Lymphatic: No pelvic adenopathy. No vascular calcifications are noted. Reproductive: The uterus is anteflexed normal size. No adnexal mass. No pelvic free fluid Other: Right inguinal lymph node measures 1.1 cm short-axis. Left inguinal lymph node measures 1 cm short-axis. These are borderline enlarged by size criteria. Probable reactive. The study is significant limited without IV contrast. There is poorly visualized a midline posterior perineal perianal fluid collection measures 1.2 cm. A small perianal abscess is suspected. Proctitis cannot be excluded. Clinical correlation is necessary. Mild stranding of adjacent subcutaneous fat axial image 48. Musculoskeletal: No destructive bony lesions are noted. IMPRESSION: 1. The study is markedly limited without IV contrast there is poorly visualized a midline posterior perineal perianal fluid collection measures 1.2 cm. A small perianal abscess is suspected. Proctitis cannot be excluded. Clinical correlation is necessary. Mild stranding of adjacent subcutaneous fat axial image 48. 2. Nonspecific mild thickening of urinary bladder wall. Mild cystitis cannot be excluded. 3. Normal size anteflexed uterus.  No adnexal mass. 4. Right inguinal lymph node measures 1.1 cm short-axis. Left inguinal lymph node measures 1 cm short-axis. These are borderline enlarged by size criteria. Probable reactive. Electronically Signed   By: Lahoma Crocker M.D.   On: 04/18/2016 11:27    Review of Systems  Constitutional: Negative for chills and fever.  Eyes: Negative for blurred vision and double vision.  Respiratory: Negative for cough and shortness of breath.   Cardiovascular:  Negative for chest pain and palpitations.  Gastrointestinal: Negative for abdominal pain, nausea and vomiting.  Genitourinary: Negative for dysuria, frequency and urgency.  Musculoskeletal: Negative for myalgias.  Skin: Negative for itching and rash.  Neurological: Negative for dizziness and headaches.   Blood pressure (!) 93/54, pulse 72, temperature 98.3 F (36.8 C), temperature source Oral, resp. rate 17, height '5\' 3"'$  (1.6 m), weight 77.1 kg (170 lb), last menstrual period 04/11/2016, SpO2 100 %. Physical Exam  Constitutional: She is oriented to person, place, and time. She appears well-developed and well-nourished. No distress.  HENT:  Head: Normocephalic and atraumatic.  Eyes: Conjunctivae and EOM are normal. Pupils are equal, round, and reactive to light.  Neck: Normal range of motion. Neck supple.  Cardiovascular: Normal rate and regular rhythm.   Respiratory: Effort normal and breath sounds normal. No  respiratory distress.  GI: Soft. She exhibits no distension. There is no tenderness.  Genitourinary:     Musculoskeletal: Normal range of motion.  Neurological: She is alert and oriented to person, place, and time.    Assessment/Plan: Pt with a resolving perirectal abscess and thrombosed hemorrhoid.  Recommend sitz baths TID and after BM's.  Steroid and lidocaine ointments for hemorrhoid.  Avoid narcotics.  Miralax PRN for BM's.    Maria Mcgann C. 9/74/1638, 45:36 PM

## 2016-04-18 NOTE — ED Provider Notes (Signed)
[facility]-EMERGENCY DEPT Provider Note   CSN: [id number] Arrival date & time: 04/18/16  0904   History   Chief Complaint Chief Complaint  Patient presents with  . Abscess    HPI Maria Ellis is a 31 y.o. female.  HPI   Pt with PMH of HA, Bell's Palsy, chiari malformation, hydrocephalus with shunt placement, hx of appendectomy,  presents to the ER for evaluation of an abscess. She was seen at [facility] 4 days ago for anal pain and swelling; diagnosed with a hemorrhoid. Seen yesterday at [facility] in [facility] and diagnosed with ectal abscess; advised to go to the ER for eval by a surgeon and was treated with Toradol.  She says that after the pain medication her pain improved significantly so she sat on her bottom causing the mass to bust open. It is now bleeding and draining puss. She is in significant amount of pain. Endorses a low grade fever yesterday. None today, no N/V/D. She had a perirectal abscess in the past that was lanced but reports never having had an abscess affecting this specific area before. No abdominal pain.  Past Medical History:  Diagnosis Date  . Bell's palsy   . Headache   . History of Chiari malformation   . Hydrocephalus     Patient Active Problem List   Diagnosis Date Noted  . Acute appendicitis 05/18/2013    Past Surgical History:  Procedure Laterality Date  . BRAIN SURGERY    . LAPAROSCOPIC APPENDECTOMY Left 05/18/2013   Procedure: APPENDECTOMY LAPAROSCOPIC;  Surgeon: Velora Heckler, MD;  Location: [facility];  Service: General;  Laterality: Left;  Marland Kitchen MULTIPLE TOOTH EXTRACTIONS    . SHUNT REPLACEMENT     x 2  . TONSILECTOMY, ADENOIDECTOMY, BILATERAL MYRINGOTOMY AND TUBES      OB History    Gravida Para Term Preterm AB Living   1       1     SAB TAB Ectopic Multiple Live Births   1               Home Medications    Prior to Admission medications   Medication Sig Start Date End Date Taking? Authorizing Provider  atorvastatin (LIPITOR)  20 MG tablet Take 20 mg by mouth daily at 6 PM.  03/10/16  Yes Historical Provider, MD  cholecalciferol (VITAMIN D) 1000 units tablet Take 1,000 Units by mouth daily.   Yes Historical Provider, MD  fluticasone (FLONASE) 50 MCG/ACT nasal spray Place 2 sprays into both nostrils daily. 03/12/16  Yes Historical Provider, MD  furosemide (LASIX) 20 MG tablet Take 20 mg by mouth daily. 04/11/16  Yes Historical Provider, MD  hydrOXYzine (ATARAX/VISTARIL) 10 MG tablet TAKE 1 TABLET BY MOUTH AM AND AT LUNCH, THEN TAKE 2 TABLETS AT BEDTIME 04/08/16  Yes Historical Provider, MD  Oxycodone HCl 10 MG TABS Take 10 mg by mouth every 6 (six) hours as needed (pain).  04/08/16  Yes Historical Provider, MD  potassium chloride (K-DUR,KLOR-CON) 10 MEQ tablet Take 10 mEq by mouth 2 (two) times daily. 04/11/16  Yes Historical Provider, MD  PROAIR HFA 108 (90 Base) MCG/ACT inhaler Inhale 2 puffs into the lungs every 4 (four) hours as needed for wheezing or shortness of breath.  04/11/16  Yes Historical Provider, MD  promethazine (PHENERGAN) 25 MG tablet Take 25 mg by mouth every 6 (six) hours as needed for nausea.  03/12/16  Yes Historical Provider, MD  topiramate (TOPAMAX) 100 MG tablet Take 100 mg  by mouth daily. 04/11/16  Yes Historical Provider, MD  hydrocortisone cream 0.5 % Apply 1 application topically 2 (two) times daily. 04/18/16   Lakya Schrupp Neva Seat, PA-C  lidocaine (XYLOCAINE) 5 % ointment Apply 1 application topically as needed. 04/18/16   Christophe Rising Neva Seat, PA-C  polyethylene glycol [facility]) packet Take 17 g by mouth daily. 04/18/16   Midas Daughety Neva Seat, PA-C  traMADol (ULTRAM) 50 MG tablet Take 1 tablet (50 mg total) by mouth every 6 (six) hours as needed. 04/18/16   Marlon Pel, PA-C    Family History Family History  Problem Relation Age of Onset  . Diabetes Mother   . Diabetes Father   . Heart failure Father   . Cirrhosis Father     Social History Social History  Substance Use Topics  . Smoking status: Current Every  Day Smoker    Types: Cigarettes  . Smokeless tobacco: Never Used     Comment: Trying to quit - smoking about 2 cigarettes per week.  . Alcohol use No     Allergies   Multihance [gadobenate]; Penicillins; and Tylenol [acetaminophen]   Review of Systems Review of Systems Review of Systems All other systems negative except as documented in the HPI. All pertinent positives and negatives as reviewed in the HPI.   Physical Exam Updated Vital Signs BP (!) 93/54 (BP Location: Left Arm)   Pulse 72   Temp 98.3 F (36.8 C) (Oral)   Resp 17   Ht  (1.6 m)   Wt 77.1 kg   LMP 04/11/2016   SpO2 100%   BMI 30.11 kg/m   Physical Exam  Constitutional: She appears well-developed and well-nourished.  HENT:  Head: Normocephalic and atraumatic.  Eyes: Conjunctivae are normal. Pupils are equal, round, and reactive to light.  Neck: Trachea normal, normal range of motion and full passive range of motion without pain. Neck supple.  Cardiovascular: Normal rate, regular rhythm and normal pulses.   Pulmonary/Chest: Effort normal and breath sounds normal. Chest wall is not dull to percussion. She exhibits no tenderness, no crepitus, no edema, no deformity and no retraction.  Abdominal: Soft. Normal appearance and bowel sounds are normal.  Genitourinary:  Genitourinary Comments: Large 4 x 2 cm mass extending from rectum that is exquisitely tender to the touch. It is draining blood and clear/purulent discharge. No associated cellulitis.  Musculoskeletal: Normal range of motion.  Neurological: She is alert. She has normal strength.  Skin: Skin is warm, dry and intact.  Psychiatric: She has a normal mood and affect. Her speech is normal and behavior is normal. Judgment and thought content normal. Cognition and memory are normal.     ED Treatments / Results  Labs (all labs ordered are listed, but only abnormal results are displayed) Labs Reviewed  CBC WITH DIFFERENTIAL/PLATELET - Abnormal;  Notable for the following:       Result Value   WBC 15.1 (*)    Neutro Abs 11.1 (*)    Monocytes Absolute 1.4 (*)    All other components within normal limits  BASIC METABOLIC PANEL - Abnormal; Notable for the following:    Potassium 3.1 (*)    Glucose, Bld 110 (*)    All other components within normal limits    EKG  EKG Interpretation None       Radiology Ct Pelvis Wo Contrast  Result Date: 04/18/2016 CLINICAL DATA:  Rectal abscess, rectal pain for 1 week EXAM: CT PELVIS WITHOUT CONTRAST TECHNIQUE: Multidetector CT imaging of the pelvis was performed  following the standard protocol without intravenous contrast. COMPARISON:  None. FINDINGS: Urinary Tract: The study is markedly limited without IV contrast. Nonspecific mild thickening of the wall of under distended urinary bladder. Mild cystitis cannot be excluded. Bilateral distal ureter is unremarkable. No calcified calculi are noted within urinary bladder. Bowel: The patient is status post appendectomy. No pericecal inflammation. Some colonic gas noted within distal sigmoid colon and rectum. No evidence of colitis or diverticulitis on this unenhanced scan. Vascular/Lymphatic: No pelvic adenopathy. No vascular calcifications are noted. Reproductive: The uterus is anteflexed normal size. No adnexal mass. No pelvic free fluid Other: Right inguinal lymph node measures 1.1 cm short-axis. Left inguinal lymph node measures 1 cm short-axis. These are borderline enlarged by size criteria. Probable reactive. The study is significant limited without IV contrast. There is poorly visualized a midline posterior perineal perianal fluid collection measures 1.2 cm. A small perianal abscess is suspected. Proctitis cannot be excluded. Clinical correlation is necessary. Mild stranding of adjacent subcutaneous fat axial image 48. Musculoskeletal: No destructive bony lesions are noted. IMPRESSION: 1. The study is markedly limited without IV contrast there is poorly  visualized a midline posterior perineal perianal fluid collection measures 1.2 cm. A small perianal abscess is suspected. Proctitis cannot be excluded. Clinical correlation is necessary. Mild stranding of adjacent subcutaneous fat axial image 48. 2. Nonspecific mild thickening of urinary bladder wall. Mild cystitis cannot be excluded. 3. Normal size anteflexed uterus.  No adnexal mass. 4. Right inguinal lymph node measures 1.1 cm short-axis. Left inguinal lymph node measures 1 cm short-axis. These are borderline enlarged by size criteria. Probable reactive. Electronically Signed   By: Natasha Mead M.D.   On: 04/18/2016 11:27    Procedures Procedures (including critical care time)  Medications Ordered in ED Medications  sodium chloride 0.9 % bolus 1,000 mL (0 mLs Intravenous Stopped 04/18/16 1245)  morphine 4 MG/ML injection 4 mg (4 mg Intravenous Given 04/18/16 1034)  ondansetron (ZOFRAN) injection 4 mg (4 mg Intravenous Given 04/18/16 1033)  sodium chloride 0.9 % bolus 1,000 mL (0 mLs Intravenous Stopped 04/18/16 1254)  ketorolac (TORADOL) 30 MG/ML injection 30 mg (30 mg Intravenous Given 04/18/16 1254)     Initial Impression / Assessment and Plan / ED Course  I have reviewed the triage vital signs and the nursing notes.  Pertinent labs & imaging results that were available during my care of the patient were reviewed by me and considered in my medical decision making (see chart for details).     10:01 am-- Will discuss case with general surgery to advise on need for imaging prior to requesting formal consult.  11:55   am - CT pelv is limited by contrast allergy. Discussed case with Dr. Maisie Fus, A. With GenSurg who I have asked to see the patient for further treatment of abscess. She has agreed to see the patient.   12:45 pm: Gen surgery has seen and recommends "Pt with a resolving perirectal abscess and thrombosed hemorrhoid.  Recommend sitz baths TID and after BM's.  Steroid and lidocaine  ointments for hemorrhoid.  Avoid narcotics.  Miralax PRN for BM's. "   Final Clinical Impressions(s) / ED Diagnoses   Final diagnoses:  Perirectal abscess  Thrombosed hemorrhoids    New Prescriptions Discharge Medication List as of 04/18/2016 12:47 PM    START taking these medications   Details  hydrocortisone cream 0.5 % Apply 1 application topically 2 (two) times daily., Starting Fri 04/18/2016, Print    lidocaine (XYLOCAINE) 5 %  ointment Apply 1 application topically as needed., Starting Fri 04/18/2016, Print    polyethylene glycol (MIRALAX) packet Take 17 g by mouth daily., Starting Fri 04/18/2016, Print    traMADol (ULTRAM) 50 MG tablet Take 1 tablet (50 mg total) by mouth every 6 (six) hours as needed., Starting Fri 04/18/2016, Print         Marlon Pel, PA-C 04/18/16 1156    Marlon Pel, PA-C 04/18/16 1552    Lorre Nick, MD 04/20/16 [phone]

## 2016-04-18 NOTE — ED Notes (Signed)
ED Provider at bedside. 

## 2016-04-18 NOTE — ED Notes (Signed)
Gave patient paper scrub pants, pad and mesh panty to wear home.

## 2016-04-18 NOTE — ED Notes (Signed)
Pt aware of need for urine. She states she would like her pain medication  before trying to use restroom due to pain.

## 2016-05-14 DIAGNOSIS — Z0289 Encounter for other administrative examinations: Secondary | ICD-10-CM

## 2016-07-09 ENCOUNTER — Telehealth: Payer: Self-pay | Admitting: Neurology

## 2016-07-09 NOTE — Telephone Encounter (Addendum)
Spoke to patient - she has already been evaluated by her PCP and he has ordered CT scans to check her VP shunt.  If she has a problem, she is established with Dr. Brooke PaceStephen Tatter, neurosurgeon at [facility].  She has not been seen in our office since July 2016.  She has scheduled a follow up with Dr. Terrace ArabiaYan in August.

## 2016-07-09 NOTE — Telephone Encounter (Signed)
Pt thinks her shunt needs to be checked. Said she can check the valve for pressure readings but it is not pushing in. Last appt was in 2016. Pt said she has been going to the ED for shunt problems, said she did not have transportation to come from [place]. She now has a car. Please call to discuss.

## 2016-08-20 ENCOUNTER — Telehealth: Payer: Self-pay | Admitting: Neurology

## 2016-08-20 NOTE — Telephone Encounter (Signed)
Returned the call - patient has been worked into Dr. Zannie CoveYan's schedule on 08/21/16.

## 2016-08-20 NOTE — Telephone Encounter (Signed)
Pt mother(on DPR) is asking to be called about any possible way of getting pt seen sooner.  Pt mother very concerned about the state of pt's Bell's palsy. Pt on wait list and advised to use ER if needed.  Pt mother has expressed her concerns of pt and would like a call back as soon as possible

## 2016-08-21 ENCOUNTER — Encounter: Payer: Self-pay | Admitting: Neurology

## 2016-08-21 ENCOUNTER — Ambulatory Visit (INDEPENDENT_AMBULATORY_CARE_PROVIDER_SITE_OTHER): Payer: Medicare Other | Admitting: Neurology

## 2016-08-21 DIAGNOSIS — G510 Bell's palsy: Secondary | ICD-10-CM | POA: Insufficient documentation

## 2016-08-21 DIAGNOSIS — Z982 Presence of cerebrospinal fluid drainage device: Secondary | ICD-10-CM | POA: Insufficient documentation

## 2016-08-21 DIAGNOSIS — G43709 Chronic migraine without aura, not intractable, without status migrainosus: Secondary | ICD-10-CM | POA: Insufficient documentation

## 2016-08-21 DIAGNOSIS — IMO0002 Reserved for concepts with insufficient information to code with codable children: Secondary | ICD-10-CM | POA: Insufficient documentation

## 2016-08-21 HISTORY — DX: Presence of cerebrospinal fluid drainage device: Z98.2

## 2016-08-21 HISTORY — DX: Bell's palsy: G51.0

## 2016-08-21 MED ORDER — VALACYCLOVIR HCL 500 MG PO TABS: 1000.0000 mg | ORAL_TABLET | Freq: Three times a day (TID) | ORAL | 0 refills | Status: DC

## 2016-08-21 MED ORDER — DIAZEPAM 5 MG PO TABS: 5.0000 mg | ORAL_TABLET | Freq: Four times a day (QID) | ORAL | 0 refills | Status: DC | PRN

## 2016-08-21 NOTE — Patient Instructions (Signed)
Benson Norwayatter Stephen B MD Doctor in [place], [facility] Address: [address], [place], [place] [address]  Phone: [phone]

## 2016-08-21 NOTE — Progress Notes (Signed)
PATIENT: Maria Ellis DOB: [birth year]  Chief Complaint  Patient presents with  . Bell's Palsy    She is here with her mother, Camelia Engerri, to discuss her repeat episodes of Bell's Palsy.  She is having difficulty with speech, eating and swallowing her medications.   . Migraine    Reports 3-4 severe migraines per month.  She is now taking Topamax 125mg  daily.      HISTORICAL (Initial visit May 2016)  Maria Ellis is a 31 years old left-handed female, [facility] of recurrent right side Bell's palsy, initial evaluation was on Jun 06 2014.  She had a history of hydrocephalus since she was born, had her first VP shunt placement when she was 792 months old. At age 31, she developed sleepiness, frequent headaches, nausea and vomiting, was diagnosed with VP shunt blockage, had replacement when she was 31 years old, she has been doing well since then,  In 2014, she developed frequent headaches ago, right arm numbness tingling weakness, was diagnosed with Arnold-Chiari malformation, [facility] decomp[facility], which has helped her symptoms, but only partially.  Since 2011, over 5 years span, per patient, she has recurrent episodes of right facial weakness, last episode was in 2013, most recent one was in May 30 2014, she woke up noticed right facial weakness, difficulty closing her right eye, sharp pain in her right ear, she was treated with a week course of tapering dose of prednisone, 60 mg, decrement of 20mg  every 2 days, also acyclovir 1000 mg 3 times a day for 1 week, she has no significant improvement in her right facial weakness yet, continue have moderate to severe right ear pain, she denies rash broke out, no hearing loss no dizziness, no gait difficulty.  She has been under neurologist Dr. Applegate's care for 15 years.  Previously, she has tried Neurontin, Lyrica, no help, swelling, Cymbalta, muscle spasm.  She wants  some medications for her right ear pain, Currently taking Topamax 100 mg every night as headache prevention, tolerating the medication well, no significant side effect.  UPDATE June 16th 2016: We have reviewed her MRI of the brain in May 2016: right parietal intraventricular shunt catheter with tip near the left foramen of monro. 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  No acute findings. No change from CT on 07/26/13.   Her right facial pain has much improved, but continue have significant right facial weakness, could not close her right eye, there was no rash broke out  Today she is tearful, complains 4 days history of bilateral hands paresthesia, thrombin pain, right hand starting from her right fingers, radiating towards her right shoulder, left side radiating towards her left elbow, significant weakness because of the pain, she denies gait difficulty, no bowel and bladder incontinence.   She has tried different medication over past few days, hydrocodone, Celebrex, gabapentin, over-the-counter NSAIDs fail to improve her hands pain.  Add on nortriptyline, titrating to 10 mg 2 tablets every night for her bilateral hands paresthesia, pain, also for chronic migraine.  UPDATE August 03 2014: EMG nerve conduction study result in July 18 2014, she has evidence of moderate right carpal tunnel syndromes, she has wore a right wrist is pain, which has been helpful  We have reviewed MRI of the cervical spine: Posterior fossa decompression surgery due to prior Chiari I malformation noted on MRI dated 08/05/2012. 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 This appears to be more advanced than on the available sagittal only images  dated 08/05/2012.  She is on oxycodone 10mg  daily by her primary care physician, still complains of achy persistent throbbing pain in her lower back, radiating along her spine  She still has profound right facial weakness, difficulty closing her eyes  Her migraine headache has much improved with Topamax, have typical migraines once or twice each week, with light noise sensitivity lasting for a few hours, improved by sleeping dark quiet room.,   UPDATE August 21 2016: She has lost follow-up since 2016, previously seen her for recurrent right Bell's palsy, most recent episode was in 2016. Since August 16 2016, she had her first left facial weakness, started about donating of the case, then left upper and lower face weakness, she was also noted has mildly decreased left corneal sensation on today's examination,  She denied rash broke out, no hearing loss, was given the prescription of dexamethasone tapering, and acyclovir 5 times a day, she could not swallow in the medications  She has intermittent headaches, I reviewed and compared multiple previous CT head, MRI of brain, most recent one was in on August 16 2016 evidence of right sided VP shunt, there was no significant change compared to previous study.   VIEW OF SYSTEMS: Full 14 system review of systems performed and notable only for appetite change, facial swelling, throat swelling, drooling, light sensitivity, blurred vision, nausea, snoring, headache, numbness, speech difficulty, facial drooling agitation depression anxiety   ALLERGIES: Allergies  Allergen Reactions  . Multihance [Gadobenate] Nausea Only and Cough    PT HAD SNEEZING AND NAUSEA IMMEDIATELY AFTER CONTRAST INJECTION  . Penicillins Swelling    Has patient had a PCN reaction causing immediate rash, facial/tongue/throat swelling, SOB or lightheadedness with hypotension: No Has patient had a PCN reaction causing severe rash involving mucus membranes or skin necrosis: Yes Has  patient had a PCN reaction that required hospitalization No Has patient had a PCN reaction occurring within the last 10 years: Yes If all of the above answers are "NO", then may proceed with Cephalosporin use.   . Tylenol [Acetaminophen] Rash    HOME MEDICATIONS: Current Outpatient Prescriptions  Medication Sig Dispense Refill  . acyclovir (ZOVIRAX) 800 MG tablet Take 800 mg by mouth. Take five times daily.    Marland Kitchen. atorvastatin (LIPITOR) 20 MG tablet Take 20 mg by mouth daily at 6 PM.   11  . cholecalciferol (VITAMIN D) 1000 units tablet Take 1,000 Units by mouth daily.    Marland Kitchen. dexamethasone (DECADRON) 4 MG tablet Take 4 mg by mouth 2 (two) times daily.    . fluticasone (FLONASE) 50 MCG/ACT nasal spray Place 2 sprays into both nostrils daily.  99  . furosemide (LASIX) 20 MG tablet Take 20 mg by mouth daily.  1  . hydrocortisone cream 0.5 % Apply 1 application topically 2 (two) times daily. 30 g 0  . hydrOXYzine (ATARAX/VISTARIL) 10 MG tablet TAKE 1 TABLET BY MOUTH AM AND AT LUNCH, THEN TAKE 2 TABLETS AT BEDTIME  3  . Oxycodone HCl 10 MG TABS Take 10 mg by mouth every 6 (six) hours as needed (pain).   0  . potassium chloride (K-DUR,KLOR-CON) 10 MEQ tablet Take 10 mEq by mouth  2 (two) times daily.  5  . PROAIR HFA 108 (90 Base) MCG/ACT inhaler Inhale 2 puffs into the lungs every 4 (four) hours as needed for wheezing or shortness of breath.   99  . promethazine (PHENERGAN) 25 MG tablet Take 25 mg by mouth every 6 (six) hours as needed for nausea.   99  . topiramate (TOPAMAX) 100 MG tablet Take 125 mg by mouth daily.   2  . traMADol (ULTRAM) 50 MG tablet Take 1 tablet (50 mg total) by mouth every 6 (six) hours as needed. 15 tablet 0   No current facility-administered medications for this visit.     PAST MEDICAL HISTORY: Past Medical History:  Diagnosis Date  . Bell's palsy   . Headache   . History of Chiari malformation   . Hydrocephalus     PAST SURGICAL HISTORY: Past Surgical History:    Procedure Laterality Date  . BRAIN SURGERY    . LAPAROSCOPIC APPENDECTOMY Left 05/18/2013   Procedure: APPENDECTOMY LAPAROSCOPIC;  Surgeon: Velora Heckler, MD;  Location: [facility];  Service: General;  Laterality: Left;  Marland Kitchen MULTIPLE TOOTH EXTRACTIONS    . SHUNT REPLACEMENT     x 2  . TONSILECTOMY, ADENOIDECTOMY, BILATERAL MYRINGOTOMY AND TUBES      FAMILY HISTORY: Family History  Problem Relation Age of Onset  . Diabetes Mother   . Diabetes Father   . Heart failure Father   . Cirrhosis Father     SOCIAL HISTORY:  Social History   Social History  . Marital status: Single    Spouse name: N/A  . Number of children: 0  . Years of education: 12   Occupational History  . Disabled    Social History Main Topics  . Smoking status: Current Every Day Smoker    Types: Cigarettes  . Smokeless tobacco: Never Used     Comment: Trying to quit - smoking about 2 cigarettes per week.  . Alcohol use No  . Drug use: No  . Sexual activity: Yes    Birth control/ protection: None   Other Topics Concern  . Not on file   Social History Narrative   Lives at home with her mother.   Left-handed.   8-9 cups caffeine per day.     PHYSICAL EXAM   Vitals:   08/21/16 0853  BP: (!) 134/91  Pulse: 85  Weight: 170 lb (77.1 kg)  Height: 5\' 3"  (1.6 m)    Not recorded      Body mass index is 30.11 kg/m.  PHYSICAL EXAMNIATION:  Gen: NAD, conversant, well nourised, obese, well groomed                     Cardiovascular: Regular rate rhythm, no peripheral edema, warm, nontender. Eyes: Conjunctivae clear without exudates or hemorrhage Neck: Supple, no carotid bruits. Pulmonary: Clear to auscultation bilaterally   NEUROLOGICAL EXAM:  MENTAL STATUS:She is tearful during today's interview  Speech:    Speech is normal; fluent and spontaneous with normal comprehension.  Cognition:     Orientation to time, place and person     Normal recent and remote memory     Normal Attention span  and concentration     Normal Language, naming, repeating,spontaneous speech     Fund of knowledge   CRANIAL NERVES: CN II: Visual fields are full to confrontation. Fundoscopic exam is normal with sharp discs and no vascular changes. Pupils are round equal and briskly reactive to light. CN  III, IV, VI: extraocular movement are normal. No ptosis. CN V: Facial sensation is intact to pinprick in all 3 divisions bilaterally.  decreased to left side corneal reflex due to decreased sensation at the left corner  CN VII: she has mild right eye closure weakness, significant left upper lower face weakness, with maximum left eye closure still has 3 mm slit, CN VIII: Hearing is normal to rubbing fingers CN IX, X: Palate elevates symmetrically. Phonation is normal. CN XI: Head turning and shoulder shrug are intact CN XII: Tongue is midline with normal movements and no atrophy.  MOTOR: There is no pronator drift of out-stretched arms. Muscle bulk and tone are normal. Muscle strength is normal.  REFLEXES: Reflexes are 2+ and symmetric at the biceps, triceps, knees, and ankles. Plantar responses are flexor.  SENSORY: Intact to light touch, pinprick, positional sensation and vibratory sensation are intact in fingers and toes.  COORDINATION: Rapid alternating movements and fine finger movements are intact. There is no dysmetria on finger-to-nose and heel-knee-shin.    GAIT/STANCE: Posture is normal. Gait is steady with normal steps, base, arm swing, and turning. Heel and toe walking are normal. Tandem gait is normal.  Romberg is absent.   DIAGNOSTIC DATA (LABS, IMAGING, TESTING) - I reviewed patient records, labs, notes, testing and imaging myself where available.   ASSESSMENT AND PLAN  Maria Ellis is a 31 y.o. female   Recurrent Bell's palsy,   7 times on the right side, most recent one was in 2016, left Bell's palsy since August 16 2016.  Complete evaluation with MRI brain w/wo  Laboratory  evaluation to rule out inflammatory, infectious etiology  Spinal fluid testing  Valtrex 500 mg 2 tablets 3 times a day for 1 week  Refer her to ophthalmologist  History of VP shunt, Arnold-Chiari malformation status post decompression, worsening headache,  We will refer her to previous surgeon Dr. Brooke Pace for reevaluation  Levert Feinstein, M.D. Ph.D.  [facility] [address] [place], [place] [address] Ph: [phone] Fax: [phone]  CC: Referring Provider

## 2016-08-25 ENCOUNTER — Telehealth: Payer: Self-pay | Admitting: Neurology

## 2016-08-25 LAB — COMPREHENSIVE METABOLIC PANEL
ALBUMIN: 4.5 g/dL (ref 3.5–5.5)
ALK PHOS: 72 IU/L (ref 39–117)
ALT: 20 IU/L (ref 0–32)
AST: 17 IU/L (ref 0–40)
Albumin/Globulin Ratio: 2 (ref 1.2–2.2)
BUN / CREAT RATIO: 9 (ref 9–23)
BUN: 6 mg/dL (ref 6–20)
Bilirubin Total: 0.2 mg/dL (ref 0.0–1.2)
CO2: 25 mmol/L (ref 20–29)
Calcium: 9.7 mg/dL (ref 8.7–10.2)
Chloride: 99 mmol/L (ref 96–106)
Creatinine, Ser: 0.65 mg/dL (ref 0.57–1.00)
GFR calc Af Amer: 137 mL/min/{1.73_m2} (ref >59)
GFR calc non Af Amer: 119 mL/min/{1.73_m2} (ref >59)
GLOBULIN, TOTAL: 2.3 g/dL (ref 1.5–4.5)
Glucose: 91 mg/dL (ref 65–99)
Potassium: 3.7 mmol/L (ref 3.5–5.2)
Sodium: 142 mmol/L (ref 134–144)

## 2016-08-25 LAB — VITAMIN B12: Vitamin B-12: 379 pg/mL (ref 232–1245)

## 2016-08-25 LAB — FOLATE: FOLATE: 2.6 ng/mL — AB (ref >3.0)

## 2016-08-25 LAB — HIV ANTIBODY (ROUTINE TESTING W REFLEX): HIV SCREEN 4TH GENERATION: NONREACTIVE

## 2016-08-25 LAB — C-REACTIVE PROTEIN

## 2016-08-25 LAB — B. BURGDORFI ANTIBODIES: Lyme IgG/IgM Ab: <0.91 {ISR} (ref 0.00–0.90)

## 2016-08-25 LAB — IMMUNOFIXATION ELECTROPHORESIS
IGG (IMMUNOGLOBIN G), SERUM: 677 mg/dL — AB (ref 700–1600)
IgA/Immunoglobulin A, Serum: 140 mg/dL (ref 87–352)
IgM (Immunoglobulin M), Srm: 109 mg/dL (ref 26–217)
TOTAL PROTEIN: 6.8 g/dL (ref 6.0–8.5)

## 2016-08-25 LAB — ANGIOTENSIN CONVERTING ENZYME: ANGIO CONVERT ENZYME: 46 U/L (ref 14–82)

## 2016-08-25 LAB — CBC
Hematocrit: 42 % (ref 34.0–46.6)
Hemoglobin: 13.8 g/dL (ref 11.1–15.9)
MCH: 27.7 pg (ref 26.6–33.0)
MCHC: 32.9 g/dL (ref 31.5–35.7)
MCV: 84 fL (ref 79–97)
PLATELETS: 397 10*3/uL — AB (ref 150–379)
RBC: 4.99 x10E6/uL (ref 3.77–5.28)
RDW: 13.7 % (ref 12.3–15.4)
WBC: 14.9 10*3/uL — ABNORMAL HIGH (ref 3.4–10.8)

## 2016-08-25 LAB — RPR: RPR: NONREACTIVE

## 2016-08-25 LAB — VITAMIN D 25 HYDROXY (VIT D DEFICIENCY, FRACTURES): VIT D 25 HYDROXY: 30.9 ng/mL (ref 30.0–100.0)

## 2016-08-25 LAB — TSH: TSH: 0.885 u[IU]/mL (ref 0.450–4.500)

## 2016-08-25 LAB — CK: Total CK: 48 U/L (ref 24–173)

## 2016-08-25 LAB — SEDIMENTATION RATE: Sed Rate: 3 mm/hr (ref 0–32)

## 2016-08-25 LAB — HGB A1C W/O EAG: HEMOGLOBIN A1C: 5.4 % (ref 4.8–5.6)

## 2016-08-25 LAB — ANA W/REFLEX IF POSITIVE: ANA: NEGATIVE

## 2016-08-25 NOTE — Telephone Encounter (Signed)
Pt [facility], please call back

## 2016-08-25 NOTE — Telephone Encounter (Signed)
LMVM for pt to return call for her lab results.  

## 2016-08-25 NOTE — Telephone Encounter (Signed)
Please call patient, there was no significant abnormality on extensive laboratory evaluations.  Elevated WBC could due to her recent steroid use, Mildly decreased folic acid, 2.6, normal being more than 3, she could take over-the-counter folic acid supplement

## 2016-08-25 NOTE — Telephone Encounter (Signed)
Spoke to pt and relayed the lab results to her and per Dr. Terrace ArabiaYan the recommendation of adding folic acid supplement to her med list.  She verbalized understanding.

## 2016-09-05 ENCOUNTER — Other Ambulatory Visit: Payer: Self-pay | Admitting: Neurology

## 2016-09-05 ENCOUNTER — Telehealth: Payer: Self-pay | Admitting: Neurology

## 2016-09-05 ENCOUNTER — Ambulatory Visit
Admission: RE | Admit: 2016-09-05 | Discharge: 2016-09-05 | Disposition: A | Discharge status: Home / Self Care | Payer: Medicare Other | Source: Ambulatory Visit | Attending: Neurology | Admitting: Neurology

## 2016-09-05 DIAGNOSIS — Z982 Presence of cerebrospinal fluid drainage device: Secondary | ICD-10-CM

## 2016-09-05 DIAGNOSIS — IMO0002 Reserved for concepts with insufficient information to code with codable children: Secondary | ICD-10-CM

## 2016-09-05 DIAGNOSIS — G510 Bell's palsy: Secondary | ICD-10-CM

## 2016-09-05 DIAGNOSIS — G43709 Chronic migraine without aura, not intractable, without status migrainosus: Secondary | ICD-10-CM

## 2016-09-05 NOTE — Telephone Encounter (Signed)
Because of patient's insurance her PCP will have to submit referral to [facility]. Patient is aware and her PCP. Thanks Annabelle Harman.

## 2016-09-08 ENCOUNTER — Telehealth: Payer: Self-pay | Admitting: Neurology

## 2016-09-08 NOTE — Telephone Encounter (Signed)
I have called her, about MRI report, there was chronic changes, left mesial temporal lobe was crowed, but there was no change compared to May 2016, she will have an appointment with me on Sept 17 2018  IMPRESSION:  This MRI of the brain without contrast shows the following: 1.    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 Of note, this appears essentially unchanged when compared to the 06/07/2014 MRI. 2.    VP shunt from right parieto-occipital region into the anterior third ventricle or slightly to the left lateral ventricle, unchanged when compared to the previous MRI. 3.    History of suboccipital craniotomy, also seen in 2016 4.    Chronic sphenoid sinusitis.

## 2016-09-10 NOTE — Telephone Encounter (Signed)
Patient called office upset due to not being able to see Dr. Angelyn Punt until October 15th.  Patient is scared to wait that long and would like to know if there is anything we could possibly do to have her seen sooner with Dr. Angelyn Punt.  Please call

## 2016-09-17 ENCOUNTER — Ambulatory Visit: Payer: Self-pay | Admitting: Neurology

## 2016-09-17 NOTE — Telephone Encounter (Signed)
Dr. Jimmy Picketatter's office has patient on CX list . Dr. Terrace ArabiaYan can do a doctor to doctor call if she want's . [phone]. Thanks Annabelle Harmanana.

## 2016-10-06 ENCOUNTER — Encounter (INDEPENDENT_AMBULATORY_CARE_PROVIDER_SITE_OTHER): Payer: Self-pay

## 2016-10-06 ENCOUNTER — Ambulatory Visit (INDEPENDENT_AMBULATORY_CARE_PROVIDER_SITE_OTHER): Payer: Medicare Other | Admitting: Neurology

## 2016-10-06 ENCOUNTER — Encounter: Payer: Self-pay | Admitting: Neurology

## 2016-10-06 VITALS — BP 107/61 | HR 78 | Ht 63.0 in | Wt 173.0 lb

## 2016-10-06 DIAGNOSIS — IMO0002 Reserved for concepts with insufficient information to code with codable children: Secondary | ICD-10-CM

## 2016-10-06 DIAGNOSIS — G43709 Chronic migraine without aura, not intractable, without status migrainosus: Secondary | ICD-10-CM

## 2016-10-06 DIAGNOSIS — G510 Bell's palsy: Secondary | ICD-10-CM

## 2016-10-06 DIAGNOSIS — Z982 Presence of cerebrospinal fluid drainage device: Secondary | ICD-10-CM

## 2016-10-06 MED ORDER — RIZATRIPTAN BENZOATE 10 MG PO TBDP: 10.0000 mg | ORAL_TABLET | ORAL | 11 refills | Status: DC | PRN

## 2016-10-06 MED ORDER — VENLAFAXINE HCL ER 37.5 MG PO CP24: 37.5000 mg | ORAL_CAPSULE | Freq: Every day | ORAL | 0 refills | Status: DC

## 2016-10-06 MED ORDER — VENLAFAXINE HCL ER 75 MG PO CP24: 75.0000 mg | ORAL_CAPSULE | Freq: Every day | ORAL | 11 refills | Status: DC

## 2016-10-06 NOTE — Patient Instructions (Signed)
You may take Maxalt 10 mg as needed together with  Phenergan, Aleve  As needed for migraine

## 2016-10-06 NOTE — Progress Notes (Signed)
PATIENT: Maria Ellis DOB: [birth year]  Chief Complaint  Patient presents with  . Left-sided Bell's Palsy    She is here with her mother, Karna Christmas.  They would like to further review her MRI results. She has a pending appt w/ Dr. Salomon Fick at [facility] on 9/24.18. Reports improvement in speech since last seen.  She has not completed her LP yet.    HISTORICAL (Initial visit May 2016)  Maria Ellis is a 31 years old left-handed female, referred by her primary care PA [facility] for evaluation of recurrent right side Bell's palsy, initial evaluation was on Jun 06 2014.  She had a history of hydrocephalus since she was born, had her first VP shunt placement when she was 57 months old. At age 61, she developed sleepiness, frequent headaches, nausea and vomiting, was diagnosed with VP shunt blockage, had replacement when she was 31 years old, she has been doing well since then,  In 2014, she developed frequent headaches ago, right arm numbness tingling weakness, was diagnosed with Arnold-Chiari malformation, had decompression surgery at [facility], which has helped her symptoms, but only partially.  Since 2011, over 5 years span, per patient, she has recurrent episodes of right facial weakness, last episode was in 2013, most recent one was in May 30 2014, she woke up noticed right facial weakness, difficulty closing her right eye, sharp pain in her right ear, she was treated with a week course of tapering dose of prednisone, 60 mg, decrement of '20mg'$  every 2 days, also acyclovir 1000 mg 3 times a day for 1 week, she has no significant improvement in her right facial weakness yet, continue have moderate to severe right ear pain, she denies rash broke out, no hearing loss no dizziness, no gait difficulty.  She has been under neurologist Dr. Applegate's care for 15 years.  Previously, she has tried Neurontin, Lyrica, no help, swelling, Cymbalta, muscle spasm.  She wants some medications for  her right ear pain, Currently taking Topamax 100 mg every night as headache prevention, tolerating the medication well, no significant side effect.  UPDATE June 16th 2016: We have reviewed her MRI of the brain in May 2016: right parietal intraventricular shunt catheter with tip near the left foramen of monro. 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  No acute findings. No change from CT on 07/26/13.   Her right facial pain has much improved, but continue have significant right facial weakness, could not close her right eye, there was no rash broke out  Today she is tearful, complains 4 days history of bilateral hands paresthesia, thrombin pain, right hand starting from her right fingers, radiating towards her right shoulder, left side radiating towards her left elbow, significant weakness because of the pain, she denies gait difficulty, no bowel and bladder incontinence.   She has tried different medication over past few days, hydrocodone, Celebrex, gabapentin, over-the-counter NSAIDs fail to improve her hands pain.  Add on nortriptyline, titrating to 10 mg 2 tablets every night for her bilateral hands paresthesia, pain, also for chronic migraine.  UPDATE August 03 2014: EMG nerve conduction study result in July 18 2014, she has evidence of moderate right carpal tunnel syndromes, she has wore a right wrist is pain, which has been helpful  We have reviewed MRI of the cervical spine: Posterior fossa decompression surgery due to prior Chiari I malformation noted on MRI dated 08/05/2012. 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 This appears to be more advanced than on the available sagittal only images dated  08/05/2012.  She is on oxycodone '10mg'$  daily by her primary care physician, still complains of achy persistent throbbing pain in her lower back, radiating along her spine  She still has profound right facial weakness, difficulty closing her eyes  Her migraine headache has much improved with Topamax, have typical migraines once or twice each week, with light noise sensitivity lasting for a few hours, improved by sleeping dark quiet room.,   UPDATE August 21 2016: She has lost follow-up since 2016, previously seen her for recurrent right Bell's palsy, most recent episode was in 2016. Since August 16 2016, she had her first left facial weakness, started with loss of taste, then left upper and lower face weakness, she was also noted has mildly decreased left corneal sensation on today's examination,  She denied rash broke out, no hearing loss, was given the prescription of dexamethasone tapering, and acyclovir 5 times a day, she could not swallow in the medications  She has intermittent headaches, I reviewed and compared multiple previous CT head, MRI of brain, most recent one was in on August 16 2016 evidence of right sided VP shunt, there was no significant change compared to previous study.  UPDATE Sept 17 2018: She has daily migraine over the past few months, and the back part of her head, lasting for a few hours, only had partial relief by Advil, she is taking Topamax 150 mg daily, she has appointment with Dr. Salomon Fick on October 13 2016.   She is tearful during today's Interview, feeling stressed about her recurrent facial paralysis, left facial weakness recovered within couple weeks, no near back to normal, still have residual right facial weakness from previous multiple recurrent right Bell's palsy.   We have personally reviewed MRI of brain in August 2018: The left mesial temporal lobe is displaced below the tentorium membrane consistent with a mild transtentorial herniation. Of note, this  appears essentially unchanged when compared to the 06/07/2014 MRI. 2.    VP shunt from right parieto-occipital region into the anterior third ventricle or slightly to the left lateral ventricle, unchanged when compared to the previous MRI. 3.    History of suboccipital craniotomy, also seen in 2016 4.    Chronic sphenoid sinusitis.  Extensive laboratory evaluation showed mildly decreased folic acid level, otherwise normal and negative angiotension converting enzyme, CMP, Lyme titer, ANA, A1c, CPK TSH, vitamin D, B12, ESR, RPR, HIV, C-reactive protein, mild elevated WBC could due to prednisone treatment  VIEW OF SYSTEMS: Full 14 system review of systems performed and notable only for ear discharge, ringing ears, runny nose, journaling, eye discharge, eye itching, light sensitivity, double vision, cough, excessive thirst, constipation, nausea, restless leg, insomnia, frequent awakening, insomnia, daytime sleepiness, snoring, environmental allergy, frequent urination, joint pain, back pain, muscle cramps, neck pain, stiffness, dizziness, headaches, weakness, facial drooling, depression anxiety   ALLERGIES: Allergies  Allergen Reactions  . Multihance [Gadobenate] Nausea Only and Cough    PT HAD SNEEZING AND NAUSEA IMMEDIATELY AFTER CONTRAST INJECTION  . Penicillins Swelling    Has patient had a PCN reaction causing immediate rash, facial/tongue/throat swelling, SOB or lightheadedness with hypotension: No Has patient had a PCN reaction causing severe rash involving mucus membranes or  skin necrosis: Yes Has patient had a PCN reaction that required hospitalization No Has patient had a PCN reaction occurring within the last 10 years: Yes If all of the above answers are "NO", then may proceed with Cephalosporin use.   . Tylenol [Acetaminophen] Rash and Other (See Comments)    Stomach cramping    HOME MEDICATIONS: Current Outpatient Prescriptions  Medication Sig Dispense Refill  . atorvastatin  (LIPITOR) 20 MG tablet Take 20 mg by mouth daily at 6 PM.   11  . cholecalciferol (VITAMIN D) 1000 units tablet Take 1,000 Units by mouth daily.    . fluticasone (FLONASE) 50 MCG/ACT nasal spray Place 2 sprays into both nostrils daily.  99  . furosemide (LASIX) 20 MG tablet Take 20 mg by mouth daily.  1  . hydrocortisone cream 0.5 % Apply 1 application topically 2 (two) times daily. 30 g 0  . hydrOXYzine (ATARAX/VISTARIL) 10 MG tablet TAKE 1 TABLET BY MOUTH AM AND AT LUNCH, THEN TAKE 2 TABLETS AT BEDTIME  3  . Oxycodone HCl 10 MG TABS Take 10 mg by mouth every 6 (six) hours as needed (pain).   0  . potassium chloride (K-DUR,KLOR-CON) 10 MEQ tablet Take 10 mEq by mouth 2 (two) times daily.  5  . PROAIR HFA 108 (90 Base) MCG/ACT inhaler Inhale 2 puffs into the lungs every 4 (four) hours as needed for wheezing or shortness of breath.   99  . promethazine (PHENERGAN) 25 MG tablet Take 25 mg by mouth every 6 (six) hours as needed for nausea.   99  . topiramate (TOPAMAX) 100 MG tablet Take 125 mg by mouth daily.   2  . traMADol (ULTRAM) 50 MG tablet Take 1 tablet (50 mg total) by mouth every 6 (six) hours as needed. 15 tablet 0  . valACYclovir (VALTREX) 500 MG tablet Take 2 tablets (1,000 mg total) by mouth 3 (three) times daily. 42 tablet 0   No current facility-administered medications for this visit.     PAST MEDICAL HISTORY: Past Medical History:  Diagnosis Date  . Bell's palsy   . Headache   . History of Chiari malformation   . Hydrocephalus     PAST SURGICAL HISTORY: Past Surgical History:  Procedure Laterality Date  . BRAIN SURGERY    . LAPAROSCOPIC APPENDECTOMY Left 05/18/2013   Procedure: APPENDECTOMY LAPAROSCOPIC;  Surgeon: Earnstine Regal, MD;  Location: [facility];  Service: General;  Laterality: Left;  Marland Kitchen MULTIPLE TOOTH EXTRACTIONS    . SHUNT REPLACEMENT     x 2  . TONSILECTOMY, ADENOIDECTOMY, BILATERAL MYRINGOTOMY AND TUBES      FAMILY HISTORY: Family History  Problem Relation  Age of Onset  . Diabetes Mother   . Diabetes Father   . Heart failure Father   . Cirrhosis Father     SOCIAL HISTORY:  Social History   Social History  . Marital status: Single    Spouse name: N/A  . Number of children: 0  . Years of education: 12   Occupational History  . Disabled    Social History Main Topics  . Smoking status: Current Every Day Smoker    Types: Cigarettes  . Smokeless tobacco: Never Used     Comment: Trying to quit - smoking about 2 cigarettes per week.  . Alcohol use No  . Drug use: No  . Sexual activity: Yes    Birth control/ protection: None   Other Topics Concern  . Not on file  Social History Narrative   Lives at home with her mother.   Left-handed.   8-9 cups caffeine per day.     PHYSICAL EXAM   Vitals:   10/06/16 1041  BP: 107/61  Pulse: 78  Weight: 173 lb (78.5 kg)  Height: '5\' 3"'$  (1.6 m)    Not recorded      Body mass index is 30.65 kg/m.  PHYSICAL EXAMNIATION:  Gen: NAD, conversant, well nourised, obese, well groomed                     Cardiovascular: Regular rate rhythm, no peripheral edema, warm, nontender. Eyes: Conjunctivae clear without exudates or hemorrhage Neck: Supple, no carotid bruits. Pulmonary: Clear to auscultation bilaterally   NEUROLOGICAL EXAM:  MENTAL STATUS:She is tearful during today's interview  Speech:    Speech is normal; fluent and spontaneous with normal comprehension.  Cognition:     Orientation to time, place and person     Normal recent and remote memory     Normal Attention span and concentration     Normal Language, naming, repeating,spontaneous speech     Fund of knowledge   CRANIAL NERVES: CN II: Visual fields are full to confrontation. Fundoscopic exam is normal with sharp discs and no vascular changes. Pupils are round equal and briskly reactive to light. CN III, IV, VI: extraocular movement are normal. No ptosis. CN V: Facial sensation is intact to pinprick in all 3  divisions bilaterally.  decreased to left side corneal reflex due to decreased sensation at the left corner  CN VII: she has mild right eye closure weakness, difficulty moving right cheek muscle,  she has no significant left facial upper and lower muscle weakness  CN VIII: Hearing is normal to rubbing fingers CN IX, X: Palate elevates symmetrically. Phonation is normal. CN XI: Head turning and shoulder shrug are intact CN XII: Tongue is midline with normal movements and no atrophy.  MOTOR: There is no pronator drift of out-stretched arms. Muscle bulk and tone are normal. Muscle strength is normal.  REFLEXES: Reflexes are 2+ and symmetric at the biceps, triceps, knees, and ankles. Plantar responses are flexor.  SENSORY: Intact to light touch, pinprick, positional sensation and vibratory sensation are intact in fingers and toes.  COORDINATION: Rapid alternating movements and fine finger movements are intact. There is no dysmetria on finger-to-nose and heel-knee-shin.    GAIT/STANCE: Posture is normal. Gait is steady with normal steps, base, arm swing, and turning. Heel and toe walking are normal. Tandem gait is normal.  Romberg is absent.   DIAGNOSTIC DATA (LABS, IMAGING, TESTING) - I reviewed patient records, labs, notes, testing and imaging myself where available.   ASSESSMENT AND PLAN  Maria Ellis is a 31 y.o. female   Recurrent Bell's palsy,   7 times on the right side, most recent one was in 2016, left Bell's palsy since August 16 2016.  MRI of the brain showed mild anatomic variation but no acute structural relation   extensive laboratory evaluation failed to demonstrate etiology   Hold off lumbar puncture at this point   History of VP shunt, Arnold-Chiari malformation status post decompression, Worsening chronic migraine headache   Effexor xr 37.'5mg'$  titrating to '75mg'$  daily as migraine prevention  Maxalt as needed.  Marcial Pacas, M.D. Ph.D.  [facility] [address] [place], [place] [address] Ph: [phone] Fax: [phone]  CC: Referring Provider

## 2016-11-09 ENCOUNTER — Encounter (HOSPITAL_COMMUNITY): Payer: Self-pay | Admitting: Emergency Medicine

## 2016-11-09 ENCOUNTER — Ambulatory Visit (HOSPITAL_COMMUNITY)
Admission: EM | Admit: 2016-11-09 | Discharge: 2016-11-09 | Disposition: A | Discharge status: Home / Self Care | Payer: Medicare Other | Attending: Emergency Medicine | Admitting: Emergency Medicine

## 2016-11-09 DIAGNOSIS — K047 Periapical abscess without sinus: Secondary | ICD-10-CM | POA: Diagnosis not present

## 2016-11-09 MED ORDER — CLINDAMYCIN HCL 150 MG PO CAPS: 450.0000 mg | ORAL_CAPSULE | Freq: Three times a day (TID) | ORAL | 0 refills | Status: AC

## 2016-11-09 NOTE — ED Provider Notes (Signed)
[facility]    CSN: [id number] Arrival date & time: 11/09/16  1240     History   Chief Complaint Chief Complaint  Patient presents with  . Dental Pain    HPI NAKAILA FREEZE is a 31 y.o. female.   Consuello presents with her niece with complaints of left upper tooth pain with facial swelling which started last night and significantly worsened this morning. Tooth is broken. Has had similar infections in the past which required extraction. Rates pain 8/10. Denies fevers or chills. States she has a dental appointment set up in approximately 2 weeks. She took ibuprofen last night which helped minimally. She takes oxycodone at baseline.       Past Medical History:  Diagnosis Date  . Bell's palsy   . Headache   . History of Chiari malformation   . Hydrocephalus     Patient Active Problem List   Diagnosis Date Noted  . Left-sided Bell's palsy 08/21/2016  . 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  . Chronic migraine 08/21/2016  . Acute appendicitis 05/18/2013    Past Surgical History:  Procedure Laterality Date  . BRAIN SURGERY    . LAPAROSCOPIC APPENDECTOMY Left 05/18/2013   Procedure: APPENDECTOMY LAPAROSCOPIC;  Surgeon: Velora Heckler, MD;  Location: [facility];  Service: General;  Laterality: Left;  Marland Kitchen MULTIPLE TOOTH EXTRACTIONS    . SHUNT REPLACEMENT     x 2  . TONSILECTOMY, ADENOIDECTOMY, BILATERAL MYRINGOTOMY AND TUBES      OB History    Gravida Para Term Preterm AB Living   1       1     SAB TAB Ectopic Multiple Live Births   1               Home Medications    Prior to Admission medications   Medication Sig Start Date End Date Taking? Authorizing Provider  atorvastatin (LIPITOR) 20 MG tablet Take 20 mg by mouth daily at 6 PM.  03/10/16  Yes [provider]  cholecalciferol (VITAMIN D) 1000 units tablet Take 1,000 Units by mouth daily.   Yes [provider]  fluticasone (FLONASE) 50 MCG/ACT nasal spray Place 2 sprays into both nostrils daily. 03/12/16   Yes [provider]  furosemide (LASIX) 20 MG tablet Take 20 mg by mouth daily. 04/11/16  Yes [provider]  hydrOXYzine (ATARAX/VISTARIL) 10 MG tablet TAKE 1 TABLET BY MOUTH AM AND AT LUNCH, THEN TAKE 2 TABLETS AT BEDTIME 04/08/16  Yes [provider]  Oxycodone HCl 10 MG TABS Take 10 mg by mouth every 6 (six) hours as needed (pain).  04/08/16  Yes [provider]  potassium chloride (K-DUR,KLOR-CON) 10 MEQ tablet Take 10 mEq by mouth 2 (two) times daily. 04/11/16  Yes [provider]  PROAIR HFA 108 (90 Base) MCG/ACT inhaler Inhale 2 puffs into the lungs every 4 (four) hours as needed for wheezing or shortness of breath.  04/11/16  Yes [provider]  rizatriptan (MAXALT-MLT) 10 MG disintegrating tablet Take 1 tablet (10 mg total) by mouth as needed. May repeat in 2 hours if needed 10/06/16  Yes Levert Feinstein, MD  topiramate (TOPAMAX) 100 MG tablet Take 125 mg by mouth daily.  04/11/16  Yes [provider]  traMADol (ULTRAM) 50 MG tablet Take 1 tablet (50 mg total) by mouth every 6 (six) hours as needed. 04/18/16  Yes Neva Seat, Tiffany, PA-C  venlafaxine XR (EFFEXOR XR) 37.5 MG 24 hr capsule Take 1 capsule (37.5  mg total) by mouth daily with breakfast. 10/06/16  Yes Levert Feinstein, MD  venlafaxine XR (EFFEXOR XR) 75 MG 24 hr capsule Take 1 capsule (75 mg total) by mouth daily with breakfast. 10/06/16  Yes Levert Feinstein, MD  clindamycin (CLEOCIN) 150 MG capsule Take 3 capsules (450 mg total) by mouth 3 (three) times daily. 11/09/16 11/23/16  Linus Mako B, NP  hydrocortisone cream 0.5 % Apply 1 application topically 2 (two) times daily. 04/18/16   Marlon Pel, PA-C  promethazine (PHENERGAN) 25 MG tablet Take 25 mg by mouth every 6 (six) hours as needed for nausea.  03/12/16   [provider]  valACYclovir (VALTREX) 500 MG tablet Take 2 tablets (1,000 mg total) by mouth 3 (three) times daily. 08/21/16   Levert Feinstein, MD    Family History Family  History  Problem Relation Age of Onset  . Diabetes Mother   . Diabetes Father   . Heart failure Father   . Cirrhosis Father     Social History Social History  Substance Use Topics  . Smoking status: Current Every Day Smoker    Types: Cigarettes  . Smokeless tobacco: Never Used     Comment: Trying to quit - smoking about 2 cigarettes per week.  . Alcohol use No     Allergies   Multihance [gadobenate]; Penicillins; and Tylenol [acetaminophen]   Review of Systems Review of Systems  Constitutional: Negative.   HENT: Positive for dental problem and facial swelling.   Respiratory: Negative.   Cardiovascular: Negative.      Physical Exam Triage Vital Signs ED Triage Vitals  Enc Vitals Group     BP 11/09/16 1324 112/77     Pulse Rate 11/09/16 1324 99     Resp 11/09/16 1324 20     Temp 11/09/16 1324 98.5 F (36.9 C)     Temp Source 11/09/16 1324 Oral     SpO2 11/09/16 1324 99 %     Weight --      Height --      Head Circumference --      Peak Flow --      Pain Score 11/09/16 1325 7     Pain Loc --      Pain Edu? --      Excl. in GC? --    No data found.   Updated Vital Signs BP 112/77 (BP Location: Right Arm)   Pulse 99   Temp 98.5 F (36.9 C) (Oral)   Resp 20   LMP 11/07/2016   SpO2 99%   Visual Acuity Right Eye Distance:   Left Eye Distance:   Bilateral Distance:    Right Eye Near:   Left Eye Near:    Bilateral Near:     Physical Exam  Constitutional: She appears well-developed and well-nourished. No distress.  HENT:  Mouth/Throat: Uvula is midline and oropharynx is clear and moist. Abnormal dentition. Lacerations present.    Left cheek largely edematous   Cardiovascular: Normal rate, regular rhythm and normal heart sounds.   Pulmonary/Chest: Effort normal and breath sounds normal.  Lymphadenopathy:    She has no cervical adenopathy.     UC Treatments / Results  Labs (all labs ordered are listed, but only abnormal results are  displayed) Labs Reviewed - No data to display  EKG  EKG Interpretation None       Radiology No results found.  Procedures Procedures (including critical care time)  Medications Ordered in UC Medications - No data to display  Initial Impression / Assessment and Plan / UC Course  I have reviewed the triage vital signs and the nursing notes.  Pertinent labs & imaging results that were available during my care of the patient were reviewed by me and considered in my medical decision making (see chart for details).     Complete course of antibiotics. Continue with ibuprofen and prescribed medications as needed for pain. Ice pack application. Try to see dentist in the next week for definitive treatment. Patient verbalized understanding and agreeable to plan.   Final Clinical Impressions(s) / UC Diagnoses   Final diagnoses:  Dental infection    New Prescriptions Discharge Medication List as of 11/09/2016  1:53 PM    START taking these medications   Details  clindamycin (CLEOCIN) 150 MG capsule Take 3 capsules (450 mg total) by mouth 3 (three) times daily., Starting Sun 11/09/2016, Until Sun 11/23/2016, Normal         Controlled Substance Prescriptions [place] Controlled Substance Registry consulted? Not Applicable   Georgetta HaberBurky, Boubacar Lerette B, NP 11/09/16 1426

## 2016-11-09 NOTE — ED Triage Notes (Signed)
Pt here for dental pain on left upper side onset yest associated w/facial swelling  Denies fevers  Has appt w/dentist  A&O x4... NAD... Ambulatory

## 2017-04-02 NOTE — Progress Notes (Signed)
[facility]  PATIENT: Maria Ellis DOB: March 13, 1985   REASON FOR VISIT: Follow-up for migraine , anxiety depression HISTORY FROM: Patient    HISTORY OF PRESENT ILLNESS: RAYELYNN Ellis a 32 years old left-handed female, referred by her primary care PA [facility] for evaluation of recurrent right side Bell's palsy, initial evaluation was on Jun 06 2014.  She had a history of hydrocephalus since she was born, had her first VP shunt placement when she was 66 months old. At age 59, she developed sleepiness, frequent headaches, nausea and vomiting, was diagnosed with VP shunt blockage, had replacement when she was 32 years old, she has been doing well since then,  In 2014, she developed frequent headaches ago, right arm numbness tingling weakness, was diagnosed with Arnold-Chiari malformation, had decompression surgery at [facility], which has helped her symptoms, but only partially.  Since 2011, over 5 years span, per patient, she has recurrent episodes of right facial weakness, last episode was in 2013, most recent one was in May 30 2014, she woke up noticed right facial weakness, difficulty closing her right eye, sharp pain in her right ear, she was treated with a week course of tapering dose of prednisone, 60 mg, decrement of '20mg'$  every 2 days, also acyclovir 1000 mg 3 times a day for 1 week, she has no significant improvement in her right facial weakness yet, continue have moderate to severe right ear pain, she denies rash broke out, no hearing loss no dizziness, no gait difficulty.  She has been under neurologist Dr. Applegate's care for 15 years.  Previously, she has tried Neurontin, Lyrica, no help, swelling, Cymbalta, muscle spasm.  She wants some medications for her right ear pain, Currently taking Topamax 100 mg every night as headache prevention, tolerating the medication well, no significant side effect.  UPDATE June 16th 2016: We have reviewed  her MRI of the brain in May 2016: right parietal intraventricular shunt catheter with tip near the left foramen of monro. 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 No acute findings. No change from CT on 07/26/13.   Her right facial pain has much improved, but continue have significant right facial weakness, could not close her right eye, there was no rash broke out  Today she is tearful, complains 4 days history of bilateral hands paresthesia, thrombin pain, right hand starting from her right fingers, radiating towards her right shoulder, left side radiating towards her left elbow, significant weakness because of the pain, she denies gait difficulty, no bowel and bladder incontinence.   She has tried different medication over past few days, hydrocodone, Celebrex, gabapentin, over-the-counter NSAIDs fail to improve her hands pain.  Add on nortriptyline, titrating to 10 mg 2 tablets every night for her bilateral hands paresthesia, pain, also for chronic migraine.  UPDATE August 03 2014: EMG nerve conduction study result in July 18 2014, she has evidence of moderate right carpal tunnel syndromes, she has wore a right wrist is pain, which has been helpful  We have reviewed MRI of the cervical spine: Posterior fossa decompression surgery due to prior Chiari I malformation noted on MRI dated 08/05/2012. 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 This appears to be more advanced than on the available sagittal  only images dated 08/05/2012.  She is on oxycodone '10mg'$  daily by her primary care physician, still complains of achy persistent throbbing pain in her lower back, radiating along her spine  She still has profound right  facial weakness, difficulty closing her eyes  Her migraine headache has much improved with Topamax, have typical migraines once or twice each week, with light noise sensitivity lasting for a few hours, improved by sleeping dark quiet room.,   UPDATE August 21 2016:YY She has lost follow-up since 2016, previously seen her for recurrent right Bell's palsy, most recent episode was in 2016. Since August 16 2016, she had her first left facial weakness, started with loss of taste, then left upper and lower face weakness, she was also noted has mildly decreased left corneal sensation on today's examination,  She denied rash broke out, no hearing loss, was given the prescription of dexamethasone tapering, and acyclovir 5 times a day, she could not swallow in the medications  She has intermittent headaches, I reviewed and compared multiple previous CT head, MRI of brain, most recent one was in on August 16 2016 evidence of right sided VP shunt, there was no significant change compared to previous study.  UPDATE Sept 17 2018:YY She has daily migraine over the past few months, and the back part of her head, lasting for a few hours, only had partial relief by Advil, she is taking Topamax 150 mg daily, she has appointment with Dr. Salomon Fick on October 13 2016.   She is tearful during today's Interview, feeling stressed about her recurrent facial paralysis, left facial weakness recovered within couple weeks, no near back to normal, still have residual right facial weakness from previous multiple recurrent right Bell's palsy.   We have personally reviewed MRI of brain in August 2018: The left mesial temporal lobe is displaced below the tentorium membrane consistent with a mild transtentorial herniation. Of note, this appears essentially unchanged when compared to the 06/07/2014 MRI. 2. VP shunt from right parieto-occipital region into the anterior third ventricle or slightly to the left lateral ventricle,  unchanged when compared to the previous MRI. 3. History of suboccipital craniotomy, also seen in 2016 4. Chronic sphenoid sinusitis.  Extensive laboratory evaluation showed mildly decreased folic acid level, otherwise normal and negative angiotension converting enzyme, CMP, Lyme titer, ANA, A1c, CPK TSH, vitamin D, B12, ESR, RPR, HIV, C-reactive protein, mild elevated WBC could due to prednisone treatment UPDATE 3/18/2019CM Ms Grissinger, 32 year old female returns for follow-up with history of migraines.  She also has a history of Bell's palsy x7 on the right and once on the left.  She has seasonal allergies which have worsened which contribute to her headaches.  She was just put on a medication by her primary care for allergies.  She does not know the name.  She has not started the medication she remains on Topamax 150 mg daily for migraine prevention.  In addition she uses Maxalt acutely.  She was placed on Effexor at last visit however she took it for a few months and did not see any benefit so she stopped the medication.  She continues with right facial weakness due to Bell's palsy.  She returns for reevaluation REVIEW OF SYSTEMS: Full 14 system review of systems performed and notable only for those listed, all others are neg:  Constitutional: neg  Cardiovascular: neg Ear/Nose/Throat: Runny nose   Skin: neg Eyes: Light sensitivity, itching Respiratory: neg Gastroitestinal: neg  Hematology/Lymphatic: neg  Endocrine: neg Musculoskeletal:neg Allergy/Immunology: Environmental  allergies Neurological: Long history of headaches Psychiatric: Depression anxiety Sleep : neg   ALLERGIES: Allergies  Allergen Reactions  . Multihance [Gadobenate] Nausea Only and Cough    PT HAD SNEEZING AND NAUSEA IMMEDIATELY AFTER CONTRAST INJECTION  . Penicillins Swelling    Has patient had a PCN reaction causing immediate rash, facial/tongue/throat swelling, SOB or lightheadedness with hypotension: No Has  patient had a PCN reaction causing severe rash involving mucus membranes or skin necrosis: Yes Has patient had a PCN reaction that required hospitalization No Has patient had a PCN reaction occurring within the last 10 years: Yes If all of the above answers are "NO", then may proceed with Cephalosporin use.   . Tylenol [Acetaminophen] Rash and Other (See Comments)    Stomach cramping    HOME MEDICATIONS: Outpatient Medications Prior to Visit  Medication Sig Dispense Refill  . atorvastatin (LIPITOR) 40 MG tablet 40 mg daily.    . cefUROXime (CEFTIN) 500 MG tablet 500 mg 2 (two) times daily.    . cholecalciferol (VITAMIN D) 1000 units tablet Take 1,000 Units by mouth daily.    . fluticasone (FLONASE) 50 MCG/ACT nasal spray Place 2 sprays into both nostrils daily.  99  . furosemide (LASIX) 20 MG tablet Take 20 mg by mouth daily.  1  . hydrocortisone cream 0.5 % Apply 1 application topically 2 (two) times daily. 30 g 0  . hydrOXYzine (ATARAX/VISTARIL) 10 MG tablet TAKE 1 TABLET BY MOUTH AM AND AT LUNCH, THEN TAKE 2 TABLETS AT BEDTIME  3  . omeprazole (PRILOSEC) 40 MG capsule 40 mg daily.    Marland Kitchen OVER THE COUNTER MEDICATION OTC Benadryl allergy, takes 1/2 as needed    . Oxycodone HCl 10 MG TABS Take 10 mg by mouth every 6 (six) hours as needed (pain).   0  . potassium chloride (K-DUR,KLOR-CON) 10 MEQ tablet Take 10 mEq by mouth 2 (two) times daily.  5  . PROAIR HFA 108 (90 Base) MCG/ACT inhaler Inhale 2 puffs into the lungs every 4 (four) hours as needed for wheezing or shortness of breath.   99  . promethazine (PHENERGAN) 25 MG tablet Take 25 mg by mouth every 6 (six) hours as needed for nausea.   99  . rizatriptan (MAXALT-MLT) 10 MG disintegrating tablet Take 1 tablet (10 mg total) by mouth as needed. May repeat in 2 hours if needed 15 tablet 11  . topiramate (TOPAMAX) 100 MG tablet Take 125 mg by mouth daily.   2  . traMADol (ULTRAM) 50 MG tablet Take 1 tablet (50 mg total) by mouth every 6  (six) hours as needed. 15 tablet 0  . valACYclovir (VALTREX) 500 MG tablet Take 2 tablets (1,000 mg total) by mouth 3 (three) times daily. 42 tablet 0  . atorvastatin (LIPITOR) 20 MG tablet Take 20 mg by mouth daily at 6 PM.   11  . venlafaxine XR (EFFEXOR XR) 37.5 MG 24 hr capsule Take 1 capsule (37.5 mg total) by mouth daily with breakfast. (Patient not taking: Reported on 04/06/2017) 30 capsule 0  . venlafaxine XR (EFFEXOR XR) 75 MG 24 hr capsule Take 1 capsule (75 mg total) by mouth daily with breakfast. (Patient not taking: Reported on 04/06/2017) 30 capsule 11   No facility-administered medications prior to visit.     PAST MEDICAL HISTORY: Past Medical History:  Diagnosis Date  . Bell's palsy   . Headache   . History of Chiari malformation   . Hydrocephalus  PAST SURGICAL HISTORY: Past Surgical History:  Procedure Laterality Date  . BRAIN SURGERY    . LAPAROSCOPIC APPENDECTOMY Left 05/18/2013   Procedure: APPENDECTOMY LAPAROSCOPIC;  Surgeon: Earnstine Regal, MD;  Location: [facility];  Service: General;  Laterality: Left;  Marland Kitchen MULTIPLE TOOTH EXTRACTIONS    . SHUNT REPLACEMENT     x 2  . TONSILECTOMY, ADENOIDECTOMY, BILATERAL MYRINGOTOMY AND TUBES      FAMILY HISTORY: Family History  Problem Relation Age of Onset  . Diabetes Mother   . Diabetes Father   . Heart failure Father   . Cirrhosis Father     SOCIAL HISTORY: Social History   Socioeconomic History  . Marital status: Single    Spouse name: Not on file  . Number of children: 0  . Years of education: 58  . Highest education level: Not on file  Social Needs  . Financial resource strain: Not on file  . Food insecurity - worry: Not on file  . Food insecurity - inability: Not on file  . Transportation needs - medical: Not on file  . Transportation needs - non-medical: Not on file  Occupational History  . Occupation: Disabled  Tobacco Use  . Smoking status: Current Every Day Smoker    Types: Cigarettes  .  Smokeless tobacco: Never Used  . Tobacco comment: Trying to quit - smoking about 2 cigarettes per week.  Substance and Sexual Activity  . Alcohol use: No    Alcohol/week: 0.0 oz  . Drug use: No  . Sexual activity: Yes    Birth control/protection: None  Other Topics Concern  . Not on file  Social History Narrative   Lives at home with her mother.   Left-handed.   8-9 cups caffeine per day.     PHYSICAL EXAM  Vitals:   04/06/17 0824  BP: 104/67  Pulse: 76  Weight: 173 lb 12.8 oz (78.8 kg)   Body mass index is 30.79 kg/m.  Generalized: Well developed, obese female in no acute distress  Head: normocephalic and atraumatic,. Oropharynx benign  Neck: Supple, no carotid bruits  Cardiac: Regular rate rhythm, no murmur  Musculoskeletal: No deformity   Neurological examination   Mentation: Alert oriented to time, place, history taking. Attention span and concentration appropriate. Recent and remote memory intact.  Follows all commands speech and language fluent.   Cranial nerve II-XII: Fundoscopic exam reveals sharp disc margins.Pupils were equal round reactive to light extraocular movements were full, visual field were full on confrontational test. Facial sensation and strength were normal.  She has mild right eye closure weakness difficulty moving right cheek muscle no significant left facial upper and lower muscle weakness. hearing was intact to finger rubbing bilaterally. Uvula tongue midline. head turning and shoulder shrug were normal and symmetric.Tongue protrusion into cheek strength was normal. Motor: normal bulk and tone, full strength in the BUE, BLE, fine finger movements normal, no pronator drift. No focal weakness Sensory: normal and symmetric to light touch, pinprick, and  Vibration, in the upper and lower extremities Coordination: finger-nose-finger, heel-to-shin bilaterally, no dysmetria Reflexes: Brachioradialis 2/2, biceps 2/2, triceps 2/2, patellar 2/2, Achilles  2/2, plantar responses were flexor bilaterally. Gait and Station: Rising up from seated position without assistance, normal stance,  moderate stride, good arm swing, smooth turning, able to perform tiptoe, and heel walking without difficulty. Tandem gait is steady  DIAGNOSTIC DATA (LABS, IMAGING, TESTING) - I reviewed patient records, labs, notes, testing and imaging myself where available.  Lab Results  Component Value Date   WBC 14.9 (H) 08/21/2016   HGB 13.8 08/21/2016   HCT 42.0 08/21/2016   MCV 84 08/21/2016   PLT 397 (H) 08/21/2016      Component Value Date/Time   NA 142 08/21/2016 1006   K 3.7 08/21/2016 1006   CL 99 08/21/2016 1006   CO2 25 08/21/2016 1006   GLUCOSE 91 08/21/2016 1006   GLUCOSE 110 (H) 04/18/2016 1033   BUN 6 08/21/2016 1006   CREATININE 0.65 08/21/2016 1006   CALCIUM 9.7 08/21/2016 1006   PROT 6.8 08/21/2016 1006   ALBUMIN 4.5 08/21/2016 1006   AST 17 08/21/2016 1006   ALT 20 08/21/2016 1006   ALKPHOS 72 08/21/2016 1006   BILITOT 0.2 08/21/2016 1006   GFRNONAA 119 08/21/2016 1006   GFRAA 137 08/21/2016 1006    Lab Results  Component Value Date   HGBA1C 5.4 08/21/2016   Lab Results  Component Value Date   VITAMINB12 379 08/21/2016   Lab Results  Component Value Date   TSH 0.885 08/21/2016      ASSESSMENT AND PLAN    Lichelle Viets Michalowski is a 32 y.o. female   with history of recurrent Bell's palsy 7 times on the right side most recent 2016 once on the left in  July 2018MRI of the brain showed mild anatomic variation but no acute structural relation ,extensive laboratory evaluation failed to demonstrate etiology .History of VP shunt, Arnold-Chiari malformation status post decompression, chronic migraine headache .  Effexor at last visit not helpful, patient discontinued  PLAN: Continue Topamax 150 mg daily migraine preventative Continue Maxalt acutely as needed Begin Zoloft 25 mg daily depression Follow-up in 6 months Discussed migraine  triggers and avoidance Dennie Bible, [facility], [facility], [facility] [address] [place], [place] [address] [phone]

## 2017-04-06 ENCOUNTER — Encounter: Payer: Self-pay | Admitting: Nurse Practitioner

## 2017-04-06 ENCOUNTER — Ambulatory Visit (INDEPENDENT_AMBULATORY_CARE_PROVIDER_SITE_OTHER): Payer: Medicare Other | Admitting: Nurse Practitioner

## 2017-04-06 ENCOUNTER — Encounter (INDEPENDENT_AMBULATORY_CARE_PROVIDER_SITE_OTHER): Payer: Self-pay

## 2017-04-06 VITALS — BP 104/67 | HR 76 | Wt 173.8 lb

## 2017-04-06 DIAGNOSIS — IMO0002 Reserved for concepts with insufficient information to code with codable children: Secondary | ICD-10-CM

## 2017-04-06 DIAGNOSIS — F418 Other specified anxiety disorders: Secondary | ICD-10-CM

## 2017-04-06 DIAGNOSIS — G43709 Chronic migraine without aura, not intractable, without status migrainosus: Secondary | ICD-10-CM

## 2017-04-06 HISTORY — DX: Other specified anxiety disorders: F41.8

## 2017-04-06 MED ORDER — RIZATRIPTAN BENZOATE 10 MG PO TBDP: 10.0000 mg | ORAL_TABLET | ORAL | 11 refills | Status: DC | PRN

## 2017-04-06 MED ORDER — SERTRALINE HCL 25 MG PO TABS: 25.0000 mg | ORAL_TABLET | Freq: Every day | ORAL | 6 refills | Status: DC

## 2017-04-06 NOTE — Patient Instructions (Signed)
Continue Topamax 150 mg daily migraine preventative Continue Maxalt acutely as needed Zoloft 25 mg daily depression Follow-up in 6 months

## 2017-04-06 NOTE — Progress Notes (Signed)
I have reviewed and agreed above plan. 

## 2017-10-06 NOTE — Progress Notes (Deleted)
[facility]  PATIENT: Maria Ellis DOB: 1985-09-15   REASON FOR VISIT: Follow-up for migraine , anxiety depression HISTORY FROM: Patient    HISTORY OF PRESENT ILLNESS: Maria Ellis a 32 years old left-handed female, referred by her primary care PA [facility] for evaluation of recurrent right side Bell's palsy, initial evaluation was on Jun 06 2014.  She had a history of hydrocephalus since she was born, had her first VP shunt placement when she was 66 months old. At age 29, she developed sleepiness, frequent headaches, nausea and vomiting, was diagnosed with VP shunt blockage, had replacement when she was 32 years old, she has been doing well since then,  In 2014, she developed frequent headaches ago, right arm numbness tingling weakness, was diagnosed with Arnold-Chiari malformation, had decompression surgery at [facility], which has helped her symptoms, but only partially.  Since 2011, over 5 years span, per patient, she has recurrent episodes of right facial weakness, last episode was in 2013, most recent one was in May 30 2014, she woke up noticed right facial weakness, difficulty closing her right eye, sharp pain in her right ear, she was treated with a week course of tapering dose of prednisone, 60 mg, decrement of '20mg'$  every 2 days, also acyclovir 1000 mg 3 times a day for 1 week, she has no significant improvement in her right facial weakness yet, continue have moderate to severe right ear pain, she denies rash broke out, no hearing loss no dizziness, no gait difficulty.  She has been under neurologist Dr. Applegate's care for 15 years.  Previously, she has tried Neurontin, Lyrica, no help, swelling, Cymbalta, muscle spasm.  She wants some medications for her right ear pain, Currently taking Topamax 100 mg every night as headache prevention, tolerating the medication well, no significant side effect.  UPDATE June 16th 2016: We have reviewed  her MRI of the brain in May 2016: right parietal intraventricular shunt catheter with tip near the left foramen of monro. 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 No acute findings. No change from CT on 07/26/13.   Her right facial pain has much improved, but continue have significant right facial weakness, could not close her right eye, there was no rash broke out  Today she is tearful, complains 4 days history of bilateral hands paresthesia, thrombin pain, right hand starting from her right fingers, radiating towards her right shoulder, left side radiating towards her left elbow, significant weakness because of the pain, she denies gait difficulty, no bowel and bladder incontinence.   She has tried different medication over past few days, hydrocodone, Celebrex, gabapentin, over-the-counter NSAIDs fail to improve her hands pain.  Add on nortriptyline, titrating to 10 mg 2 tablets every night for her bilateral hands paresthesia, pain, also for chronic migraine.  UPDATE August 03 2014: EMG nerve conduction study result in July 18 2014, she has evidence of moderate right carpal tunnel syndromes, she has wore a right wrist is pain, which has been helpful  We have reviewed MRI of the cervical spine: Posterior fossa decompression surgery due to prior Chiari I malformation noted on MRI dated 08/05/2012. 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 This appears to be more advanced than on the available sagittal  only images dated 08/05/2012.  She is on oxycodone '10mg'$  daily by her primary care physician, still complains of achy persistent throbbing pain in her lower back, radiating along her spine  She still has profound right  facial weakness, difficulty closing her eyes  Her migraine headache has much improved with Topamax, have typical migraines once or twice each week, with light noise sensitivity lasting for a few hours, improved by sleeping dark quiet room.,   UPDATE August 21 2016:YY She has lost follow-up since 2016, previously seen her for recurrent right Bell's palsy, most recent episode was in 2016. Since August 16 2016, she had her first left facial weakness, started with loss of taste, then left upper and lower face weakness, she was also noted has mildly decreased left corneal sensation on today's examination,  She denied rash broke out, no hearing loss, was given the prescription of dexamethasone tapering, and acyclovir 5 times a day, she could not swallow in the medications  She has intermittent headaches, I reviewed and compared multiple previous CT head, MRI of brain, most recent one was in on August 16 2016 evidence of right sided VP shunt, there was no significant change compared to previous study.  UPDATE Sept 17 2018:YY She has daily migraine over the past few months, and the back part of her head, lasting for a few hours, only had partial relief by Advil, she is taking Topamax 150 mg daily, she has appointment with Dr. Salomon Fick on October 13 2016.   She is tearful during today's Interview, feeling stressed about her recurrent facial paralysis, left facial weakness recovered within couple weeks, no near back to normal, still have residual right facial weakness from previous multiple recurrent right Bell's palsy.   We have personally reviewed MRI of brain in August 2018: The left mesial temporal lobe is displaced below the tentorium membrane consistent with a mild transtentorial herniation. Of note, this appears essentially unchanged when compared to the 06/07/2014 MRI. 2. VP shunt from right parieto-occipital region into the anterior third ventricle or slightly to the left lateral ventricle,  unchanged when compared to the previous MRI. 3. History of suboccipital craniotomy, also seen in 2016 4. Chronic sphenoid sinusitis.  Extensive laboratory evaluation showed mildly decreased folic acid level, otherwise normal and negative angiotension converting enzyme, CMP, Lyme titer, ANA, A1c, CPK TSH, vitamin D, B12, ESR, RPR, HIV, C-reactive protein, mild elevated WBC could due to prednisone treatment UPDATE 3/18/2019CM Maria Ellis, 32 year old female returns for follow-up with history of migraines.  She also has a history of Bell's palsy x7 on the right and once on the left.  She has seasonal allergies which have worsened which contribute to her headaches.  She was just put on a medication by her primary care for allergies.  She does not know the name.  She has not started the medication she remains on Topamax 150 mg daily for migraine prevention.  In addition she uses Maxalt acutely.  She was placed on Effexor at last visit however she took it for a few months and did not see any benefit so she stopped the medication.  She continues with right facial weakness due to Bell's palsy.  She returns for reevaluation REVIEW OF SYSTEMS: Full 14 system review of systems performed and notable only for those listed, all others are neg:  Constitutional: neg  Cardiovascular: neg Ear/Nose/Throat: Runny nose   Skin: neg Eyes: Light sensitivity, itching Respiratory: neg Gastroitestinal: neg  Hematology/Lymphatic: neg  Endocrine: neg Musculoskeletal:neg Allergy/Immunology: Environmental  allergies Neurological: Long history of headaches Psychiatric: Depression anxiety Sleep : neg   ALLERGIES: Allergies  Allergen Reactions  . Multihance [Gadobenate] Nausea Only and Cough    PT HAD SNEEZING AND NAUSEA IMMEDIATELY AFTER CONTRAST INJECTION  . Penicillins Swelling    Has patient had a PCN reaction causing immediate rash, facial/tongue/throat swelling, SOB or lightheadedness with hypotension: No Has  patient had a PCN reaction causing severe rash involving mucus membranes or skin necrosis: Yes Has patient had a PCN reaction that required hospitalization No Has patient had a PCN reaction occurring within the last 10 years: Yes If all of the above answers are "NO", then may proceed with Cephalosporin use.   . Tylenol [Acetaminophen] Rash and Other (See Comments)    Stomach cramping    HOME MEDICATIONS: Outpatient Medications Prior to Visit  Medication Sig Dispense Refill  . atorvastatin (LIPITOR) 40 MG tablet 40 mg daily.    . cefUROXime (CEFTIN) 500 MG tablet 500 mg 2 (two) times daily.    . cholecalciferol (VITAMIN D) 1000 units tablet Take 1,000 Units by mouth daily.    . fluticasone (FLONASE) 50 MCG/ACT nasal spray Place 2 sprays into both nostrils daily.  99  . furosemide (LASIX) 20 MG tablet Take 20 mg by mouth daily.  1  . hydrocortisone cream 0.5 % Apply 1 application topically 2 (two) times daily. 30 g 0  . hydrOXYzine (ATARAX/VISTARIL) 10 MG tablet TAKE 1 TABLET BY MOUTH AM AND AT LUNCH, THEN TAKE 2 TABLETS AT BEDTIME  3  . omeprazole (PRILOSEC) 40 MG capsule 40 mg daily.    Marland Kitchen OVER THE COUNTER MEDICATION OTC Benadryl allergy, takes 1/2 as needed    . Oxycodone HCl 10 MG TABS Take 10 mg by mouth every 6 (six) hours as needed (pain).   0  . potassium chloride (K-DUR,KLOR-CON) 10 MEQ tablet Take 10 mEq by mouth 2 (two) times daily.  5  . PROAIR HFA 108 (90 Base) MCG/ACT inhaler Inhale 2 puffs into the lungs every 4 (four) hours as needed for wheezing or shortness of breath.   99  . promethazine (PHENERGAN) 25 MG tablet Take 25 mg by mouth every 6 (six) hours as needed for nausea.   99  . rizatriptan (MAXALT-MLT) 10 MG disintegrating tablet Take 1 tablet (10 mg total) by mouth as needed. May repeat in 2 hours if needed 12 tablet 11  . sertraline (ZOLOFT) 25 MG tablet Take 1 tablet (25 mg total) by mouth daily. 30 tablet 6  . topiramate (TOPAMAX) 100 MG tablet Take 150 mg by mouth  daily.   2  . traMADol (ULTRAM) 50 MG tablet Take 1 tablet (50 mg total) by mouth every 6 (six) hours as needed. 15 tablet 0  . valACYclovir (VALTREX) 500 MG tablet Take 2 tablets (1,000 mg total) by mouth 3 (three) times daily. 42 tablet 0   No facility-administered medications prior to visit.     PAST MEDICAL HISTORY: Past Medical History:  Diagnosis Date  . Bell's palsy   . Headache   . History of Chiari malformation   . Hydrocephalus     PAST SURGICAL HISTORY: Past Surgical History:  Procedure Laterality Date  . BRAIN SURGERY    . LAPAROSCOPIC APPENDECTOMY Left 05/18/2013   Procedure: APPENDECTOMY LAPAROSCOPIC;  Surgeon: Earnstine Regal, MD;  Location: [facility];  Service: General;  Laterality: Left;  Marland Kitchen MULTIPLE TOOTH EXTRACTIONS    . SHUNT REPLACEMENT     x 2  .  TONSILECTOMY, ADENOIDECTOMY, BILATERAL MYRINGOTOMY AND TUBES      FAMILY HISTORY: Family History  Problem Relation Age of Onset  . Diabetes Mother   . Diabetes Father   . Heart failure Father   . Cirrhosis Father     SOCIAL HISTORY: Social History   Socioeconomic History  . Marital status: Single    Spouse name: Not on file  . Number of children: 0  . Years of education: 43  . Highest education level: Not on file  Occupational History  . Occupation: Disabled  Social Needs  . Financial resource strain: Not on file  . Food insecurity:    Worry: Not on file    Inability: Not on file  . Transportation needs:    Medical: Not on file    Non-medical: Not on file  Tobacco Use  . Smoking status: Current Every Day Smoker    Types: Cigarettes  . Smokeless tobacco: Never Used  . Tobacco comment: Trying to quit - smoking about 2 cigarettes per week.  Substance and Sexual Activity  . Alcohol use: No    Alcohol/week: 0.0 standard drinks  . Drug use: No  . Sexual activity: Yes    Birth control/protection: None  Lifestyle  . Physical activity:    Days per week: Not on file    Minutes per session: Not on  file  . Stress: Not on file  Relationships  . Social connections:    Talks on phone: Not on file    Gets together: Not on file    Attends religious service: Not on file    Active member of club or organization: Not on file    Attends meetings of clubs or organizations: Not on file    Relationship status: Not on file  . Intimate partner violence:    Fear of current or ex partner: Not on file    Emotionally abused: Not on file    Physically abused: Not on file    Forced sexual activity: Not on file  Other Topics Concern  . Not on file  Social History Narrative   Lives at home with her mother.   Left-handed.   8-9 cups caffeine per day.     PHYSICAL EXAM  There were no vitals filed for this visit. There is no height or weight on file to calculate BMI.  Generalized: Well developed, obese female in no acute distress  Head: normocephalic and atraumatic,. Oropharynx benign  Neck: Supple, no carotid bruits  Cardiac: Regular rate rhythm, no murmur  Musculoskeletal: No deformity   Neurological examination   Mentation: Alert oriented to time, place, history taking. Attention span and concentration appropriate. Recent and remote memory intact.  Follows all commands speech and language fluent.   Cranial nerve II-XII: Fundoscopic exam reveals sharp disc margins.Pupils were equal round reactive to light extraocular movements were full, visual field were full on confrontational test. Facial sensation and strength were normal.  She has mild right eye closure weakness difficulty moving right cheek muscle no significant left facial upper and lower muscle weakness. hearing was intact to finger rubbing bilaterally. Uvula tongue midline. head turning and shoulder shrug were normal and symmetric.Tongue protrusion into cheek strength was normal. Motor: normal bulk and tone, full strength in the BUE, BLE, fine finger movements normal, no pronator drift. No focal weakness Sensory: normal and symmetric  to light touch, pinprick, and  Vibration, in the upper and lower extremities Coordination: finger-nose-finger, heel-to-shin bilaterally, no dysmetria Reflexes: Brachioradialis 2/2, biceps 2/2,  triceps 2/2, patellar 2/2, Achilles 2/2, plantar responses were flexor bilaterally. Gait and Station: Rising up from seated position without assistance, normal stance,  moderate stride, good arm swing, smooth turning, able to perform tiptoe, and heel walking without difficulty. Tandem gait is steady  DIAGNOSTIC DATA (LABS, IMAGING, TESTING) - I reviewed patient records, labs, notes, testing and imaging myself where available.  Lab Results  Component Value Date   WBC 14.9 (H) 08/21/2016   HGB 13.8 08/21/2016   HCT 42.0 08/21/2016   MCV 84 08/21/2016   PLT 397 (H) 08/21/2016      Component Value Date/Time   NA 142 08/21/2016 1006   K 3.7 08/21/2016 1006   CL 99 08/21/2016 1006   CO2 25 08/21/2016 1006   GLUCOSE 91 08/21/2016 1006   GLUCOSE 110 (H) 04/18/2016 1033   BUN 6 08/21/2016 1006   CREATININE 0.65 08/21/2016 1006   CALCIUM 9.7 08/21/2016 1006   PROT 6.8 08/21/2016 1006   ALBUMIN 4.5 08/21/2016 1006   AST 17 08/21/2016 1006   ALT 20 08/21/2016 1006   ALKPHOS 72 08/21/2016 1006   BILITOT 0.2 08/21/2016 1006   GFRNONAA 119 08/21/2016 1006   GFRAA 137 08/21/2016 1006    Lab Results  Component Value Date   HGBA1C 5.4 08/21/2016   Lab Results  Component Value Date   VITAMINB12 379 08/21/2016   Lab Results  Component Value Date   TSH 0.885 08/21/2016      ASSESSMENT AND PLAN    Maria Ellis is a 32 y.o. female   with history of recurrent Bell's palsy 7 times on the right side most recent 2016 once on the left in  July 2018MRI of the brain showed mild anatomic variation but no acute structural relation ,extensive laboratory evaluation failed to demonstrate etiology .History of VP shunt, Arnold-Chiari malformation status post decompression, chronic migraine headache .   Effexor at last visit not helpful, patient discontinued  PLAN: Continue Topamax 150 mg daily migraine preventative Continue Maxalt acutely as needed Begin Zoloft 25 mg daily depression Follow-up in 6 months Discussed migraine triggers and avoidance Dennie Bible, [facility], [facility], [facility] [address], [place], [place] [address] [phone]

## 2017-10-07 ENCOUNTER — Ambulatory Visit: Payer: Medicare Other | Admitting: Nurse Practitioner

## 2017-10-08 ENCOUNTER — Encounter: Payer: Self-pay | Admitting: Nurse Practitioner

## 2018-06-22 ENCOUNTER — Ambulatory Visit (HOSPITAL_COMMUNITY)
Admission: EM | Admit: 2018-06-22 | Discharge: 2018-06-22 | Disposition: A | Discharge status: Home / Self Care | Payer: Medicare Other | Attending: Family Medicine | Admitting: Family Medicine

## 2018-06-22 ENCOUNTER — Encounter (HOSPITAL_COMMUNITY): Payer: Self-pay

## 2018-06-22 ENCOUNTER — Other Ambulatory Visit: Payer: Self-pay

## 2018-06-22 DIAGNOSIS — K047 Periapical abscess without sinus: Secondary | ICD-10-CM

## 2018-06-22 MED ORDER — CLINDAMYCIN HCL 150 MG PO CAPS: 150.0000 mg | ORAL_CAPSULE | Freq: Four times a day (QID) | ORAL | 0 refills | Status: DC

## 2018-06-22 MED ORDER — TRAMADOL HCL 50 MG PO TABS: 50.0000 mg | ORAL_TABLET | Freq: Two times a day (BID) | ORAL | 0 refills | Status: AC | PRN

## 2018-06-22 NOTE — ED Notes (Signed)
Patient able to ambulate independently  

## 2018-06-22 NOTE — ED Provider Notes (Signed)
[facility]    CSN: [id number] Arrival date & time: 06/22/18  1047     History   Chief Complaint Chief Complaint  Patient presents with  . Dental Pain    HPI Maria Ellis is a 33 y.o. female.   Patient is a 33 year old female with past medical history of Bell's palsy, headaches, depression, anxiety.  She presents today with approximately 2 days of dental pain that has worsened.  The pain is in the left upper mouth.  She has multiple dental caries and broken teeth.  She had an appointment approximately 3 weeks ago to see her dentist for evaluation and they canceled due to COVID.  There is facial swelling.  Denies any drainage or fevers.  She has been using ibuprofen and Goody powders without any relief.      Past Medical History:  Diagnosis Date  . Bell's palsy   . Headache   . History of Chiari malformation   . Hydrocephalus [facility])     Patient Active Problem List   Diagnosis Date Noted  . Depression with anxiety 04/06/2017  . Left-sided Bell's palsy 08/21/2016  . 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  . Chronic migraine 08/21/2016  . Acute appendicitis 05/18/2013    Past Surgical History:  Procedure Laterality Date  . BRAIN SURGERY    . LAPAROSCOPIC APPENDECTOMY Left 05/18/2013   Procedure: APPENDECTOMY LAPAROSCOPIC;  Surgeon: Velora Heckler, MD;  Location: [facility];  Service: General;  Laterality: Left;  Marland Kitchen MULTIPLE TOOTH EXTRACTIONS    . SHUNT REPLACEMENT     x 2  . TONSILECTOMY, ADENOIDECTOMY, BILATERAL MYRINGOTOMY AND TUBES      OB History    Gravida  1   Para      Term      Preterm      AB  1   Living        SAB  1   TAB      Ectopic      Multiple      Live Births               Home Medications    Prior to Admission medications   Medication Sig Start Date End Date Taking? Authorizing Provider  atorvastatin (LIPITOR) 40 MG tablet 40 mg daily. 10/02/16   [provider]  cholecalciferol (VITAMIN D) 1000 units tablet Take 1,000  Units by mouth daily.    [provider]  clindamycin (CLEOCIN) 150 MG capsule Take 1 capsule (150 mg total) by mouth every 6 (six) hours. 06/22/18   Hurshel Bouillon, Gloris Manchester A, NP  fluticasone (FLONASE) 50 MCG/ACT nasal spray Place 2 sprays into both nostrils daily. 03/12/16   [provider]  furosemide (LASIX) 20 MG tablet Take 20 mg by mouth daily. 04/11/16   [provider]  hydrocortisone cream 0.5 % Apply 1 application topically 2 (two) times daily. 04/18/16   Marlon Pel, PA-C  hydrOXYzine (ATARAX/VISTARIL) 10 MG tablet TAKE 1 TABLET BY MOUTH AM AND AT LUNCH, THEN TAKE 2 TABLETS AT BEDTIME 04/08/16   [provider]  omeprazole (PRILOSEC) 40 MG capsule 40 mg daily. 08/28/16   [provider]  OVER THE COUNTER MEDICATION OTC Benadryl allergy, takes 1/2 as needed    [provider]  potassium chloride (K-DUR,KLOR-CON) 10 MEQ tablet Take 10 mEq by mouth 2 (two) times daily. 04/11/16   [provider]  PROAIR HFA 108 (90 Base) MCG/ACT inhaler Inhale 2 puffs into the lungs every 4 (four)  hours as needed for wheezing or shortness of breath.  04/11/16   [provider]  promethazine (PHENERGAN) 25 MG tablet Take 25 mg by mouth every 6 (six) hours as needed for nausea.  03/12/16   [provider]  rizatriptan (MAXALT-MLT) 10 MG disintegrating tablet Take 1 tablet (10 mg total) by mouth as needed. May repeat in 2 hours if needed 04/06/17   Nilda RiggsMartin, Nancy Carolyn, NP  topiramate (TOPAMAX) 100 MG tablet Take 150 mg by mouth daily.  04/11/16   [provider]  traMADol (ULTRAM) 50 MG tablet Take 1 tablet (50 mg total) by mouth every 12 (twelve) hours as needed for up to 3 days. 06/22/18 06/25/18  Janace ArisBast, Mercie Balsley A, NP    Family History Family History  Problem Relation Age of Onset  . Diabetes Mother   . Diabetes Father   . Heart failure Father   . Cirrhosis Father     Social History Social History   Tobacco Use  . Smoking status:  Current Every Day Smoker    Types: Cigarettes  . Smokeless tobacco: Never Used  . Tobacco comment: Trying to quit - smoking about 2 cigarettes per week.  Substance Use Topics  . Alcohol use: No    Alcohol/week: 0.0 standard drinks  . Drug use: No     Allergies   Multihance [gadobenate]; Penicillins; and Tylenol [acetaminophen]   Review of Systems Review of Systems   Physical Exam Triage Vital Signs ED Triage Vitals  Enc Vitals Group     BP 06/22/18 1104 115/73     Pulse Rate 06/22/18 1104 73     Resp 06/22/18 1104 18     Temp 06/22/18 1104 98.2 F (36.8 C)     Temp Source 06/22/18 1104 Oral     SpO2 06/22/18 1104 98 %     Weight 06/22/18 1101 165 lb (74.8 kg)     Height --      Head Circumference --      Peak Flow --      Pain Score 06/22/18 1100 9     Pain Loc --      Pain Edu? --      Excl. in GC? --    No data found.  Updated Vital Signs BP 115/73 (BP Location: Right Arm)   Pulse 73   Temp 98.2 F (36.8 C) (Oral)   Resp 18   Wt 165 lb (74.8 kg)   LMP 05/04/2018   SpO2 98%   BMI 29.23 kg/m   Visual Acuity Right Eye Distance:   Left Eye Distance:   Bilateral Distance:    Right Eye Near:   Left Eye Near:    Bilateral Near:     Physical Exam Vitals signs and nursing note reviewed.  Constitutional:      General: She is not in acute distress.    Appearance: Normal appearance. She is not ill-appearing, toxic-appearing or diaphoretic.     Comments: Appears in pain   HENT:     Head: Normocephalic and atraumatic.     Nose: Nose normal.     Mouth/Throat:     Pharynx: Oropharynx is clear.      Comments: Multiple dental caries and broken teeth throughout mouth. Gingival swelling and tenderness to area in the left upper mouth.  No obvious abscess or drainage.  Mild left facial swelling No trismus  Neurological:     Mental Status: She is alert.      UC Treatments / Results  Labs (all labs ordered are listed, but only abnormal results are  displayed) Labs Reviewed - No data to display  EKG None  Radiology No results found.  Procedures Procedures (including critical care time)  Medications Ordered in UC Medications - No data to display  Initial Impression / Assessment and Plan / UC Course  I have reviewed the triage vital signs and the nursing notes.  Pertinent labs & imaging results that were available during my care of the patient were reviewed by me and considered in my medical decision making (see chart for details).     Treating for dental infection with clindamycin Ibuprofen up to 800 mg as needed every 8 hours for mild to moderate pain. Tramadol for more severe pain Recommended follow-up with dentist as planned for further evaluation management of dental problems  Final Clinical Impressions(s) / UC Diagnoses   Final diagnoses:  Dental infection     Discharge Instructions     Clindamycin for infection Ibuprofen or tylenol for pain. Follow up with your dentist as planned.     ED Prescriptions    Medication Sig Dispense Auth. Provider   clindamycin (CLEOCIN) 150 MG capsule Take 1 capsule (150 mg total) by mouth every 6 (six) hours. 28 capsule Sherlene Rickel A, NP   traMADol (ULTRAM) 50 MG tablet Take 1 tablet (50 mg total) by mouth every 12 (twelve) hours as needed for up to 3 days. 6 tablet Dahlia Byes A, NP     Controlled Substance Prescriptions [place] Controlled Substance Registry consulted? Not Applicable   Janace Aris, NP 06/22/18 1141

## 2018-06-22 NOTE — Discharge Instructions (Addendum)
Clindamycin for infection Ibuprofen or tylenol for pain. Follow up with your dentist as planned.

## 2018-06-22 NOTE — ED Triage Notes (Signed)
Pt states she has a toothache. Pt states she's unable to eat. This started last night.

## 2018-06-27 DIAGNOSIS — K029 Dental caries, unspecified: Secondary | ICD-10-CM

## 2018-06-27 DIAGNOSIS — F418 Other specified anxiety disorders: Secondary | ICD-10-CM | POA: Diagnosis not present

## 2018-06-27 DIAGNOSIS — F17210 Nicotine dependence, cigarettes, uncomplicated: Secondary | ICD-10-CM

## 2018-06-27 DIAGNOSIS — L0201 Cutaneous abscess of face: Secondary | ICD-10-CM | POA: Diagnosis not present

## 2018-06-28 DIAGNOSIS — F17210 Nicotine dependence, cigarettes, uncomplicated: Secondary | ICD-10-CM | POA: Diagnosis not present

## 2018-06-28 DIAGNOSIS — K029 Dental caries, unspecified: Secondary | ICD-10-CM | POA: Diagnosis not present

## 2018-06-28 DIAGNOSIS — F418 Other specified anxiety disorders: Secondary | ICD-10-CM | POA: Diagnosis not present

## 2018-06-28 DIAGNOSIS — L0201 Cutaneous abscess of face: Secondary | ICD-10-CM | POA: Diagnosis not present

## 2018-06-29 DIAGNOSIS — F418 Other specified anxiety disorders: Secondary | ICD-10-CM | POA: Diagnosis not present

## 2018-06-29 DIAGNOSIS — K029 Dental caries, unspecified: Secondary | ICD-10-CM | POA: Diagnosis not present

## 2018-06-29 DIAGNOSIS — F17210 Nicotine dependence, cigarettes, uncomplicated: Secondary | ICD-10-CM | POA: Diagnosis not present

## 2018-06-29 DIAGNOSIS — L0201 Cutaneous abscess of face: Secondary | ICD-10-CM | POA: Diagnosis not present

## 2019-07-26 ENCOUNTER — Ambulatory Visit: Payer: Medicare Other | Admitting: Neurology

## 2019-07-26 ENCOUNTER — Encounter: Payer: Self-pay | Admitting: Neurology

## 2019-07-26 ENCOUNTER — Telehealth: Payer: Self-pay | Admitting: *Deleted

## 2019-07-26 NOTE — Telephone Encounter (Signed)
No showed follow up appointment. 

## 2020-03-22 DIAGNOSIS — Z79899 Other long term (current) drug therapy: Secondary | ICD-10-CM | POA: Diagnosis not present

## 2020-04-04 DIAGNOSIS — Z87891 Personal history of nicotine dependence: Secondary | ICD-10-CM | POA: Diagnosis not present

## 2020-04-04 DIAGNOSIS — Z79899 Other long term (current) drug therapy: Secondary | ICD-10-CM | POA: Diagnosis not present

## 2020-04-05 DIAGNOSIS — H43312 Vitreous membranes and strands, left eye: Secondary | ICD-10-CM | POA: Diagnosis not present

## 2020-04-09 DIAGNOSIS — E559 Vitamin D deficiency, unspecified: Secondary | ICD-10-CM | POA: Diagnosis not present

## 2020-04-09 DIAGNOSIS — J45909 Unspecified asthma, uncomplicated: Secondary | ICD-10-CM | POA: Diagnosis not present

## 2020-04-09 DIAGNOSIS — K219 Gastro-esophageal reflux disease without esophagitis: Secondary | ICD-10-CM | POA: Diagnosis not present

## 2020-04-09 DIAGNOSIS — G43909 Migraine, unspecified, not intractable, without status migrainosus: Secondary | ICD-10-CM | POA: Diagnosis not present

## 2020-04-09 DIAGNOSIS — E782 Mixed hyperlipidemia: Secondary | ICD-10-CM | POA: Diagnosis not present

## 2020-04-09 DIAGNOSIS — J309 Allergic rhinitis, unspecified: Secondary | ICD-10-CM | POA: Diagnosis not present

## 2020-04-19 DIAGNOSIS — Z79899 Other long term (current) drug therapy: Secondary | ICD-10-CM | POA: Diagnosis not present

## 2020-05-03 DIAGNOSIS — Z79891 Long term (current) use of opiate analgesic: Secondary | ICD-10-CM | POA: Diagnosis not present

## 2020-05-03 DIAGNOSIS — Z87891 Personal history of nicotine dependence: Secondary | ICD-10-CM | POA: Diagnosis not present

## 2020-05-03 DIAGNOSIS — Z79899 Other long term (current) drug therapy: Secondary | ICD-10-CM | POA: Diagnosis not present

## 2020-05-17 DIAGNOSIS — Z79899 Other long term (current) drug therapy: Secondary | ICD-10-CM | POA: Diagnosis not present

## 2020-05-20 DIAGNOSIS — S01312A Laceration without foreign body of left ear, initial encounter: Secondary | ICD-10-CM | POA: Diagnosis not present

## 2020-05-20 DIAGNOSIS — H66003 Acute suppurative otitis media without spontaneous rupture of ear drum, bilateral: Secondary | ICD-10-CM | POA: Diagnosis not present

## 2020-05-23 DIAGNOSIS — S00412A Abrasion of left ear, initial encounter: Secondary | ICD-10-CM | POA: Diagnosis not present

## 2020-05-31 DIAGNOSIS — Z79899 Other long term (current) drug therapy: Secondary | ICD-10-CM | POA: Diagnosis not present

## 2020-06-14 DIAGNOSIS — Z79899 Other long term (current) drug therapy: Secondary | ICD-10-CM | POA: Diagnosis not present

## 2020-07-19 DIAGNOSIS — Z79899 Other long term (current) drug therapy: Secondary | ICD-10-CM | POA: Diagnosis not present

## 2020-08-02 DIAGNOSIS — Z79899 Other long term (current) drug therapy: Secondary | ICD-10-CM | POA: Diagnosis not present

## 2020-08-07 DIAGNOSIS — J309 Allergic rhinitis, unspecified: Secondary | ICD-10-CM | POA: Diagnosis not present

## 2020-08-07 DIAGNOSIS — E559 Vitamin D deficiency, unspecified: Secondary | ICD-10-CM | POA: Diagnosis not present

## 2020-08-07 DIAGNOSIS — G43909 Migraine, unspecified, not intractable, without status migrainosus: Secondary | ICD-10-CM | POA: Diagnosis not present

## 2020-08-07 DIAGNOSIS — K219 Gastro-esophageal reflux disease without esophagitis: Secondary | ICD-10-CM | POA: Diagnosis not present

## 2020-08-07 DIAGNOSIS — J45909 Unspecified asthma, uncomplicated: Secondary | ICD-10-CM | POA: Diagnosis not present

## 2020-08-07 DIAGNOSIS — E782 Mixed hyperlipidemia: Secondary | ICD-10-CM | POA: Diagnosis not present

## 2020-08-16 DIAGNOSIS — Z79899 Other long term (current) drug therapy: Secondary | ICD-10-CM | POA: Diagnosis not present

## 2020-09-18 DIAGNOSIS — J45909 Unspecified asthma, uncomplicated: Secondary | ICD-10-CM | POA: Diagnosis not present

## 2020-09-27 DIAGNOSIS — Z79899 Other long term (current) drug therapy: Secondary | ICD-10-CM | POA: Diagnosis not present

## 2020-10-09 DIAGNOSIS — R1011 Right upper quadrant pain: Secondary | ICD-10-CM | POA: Diagnosis not present

## 2020-10-09 DIAGNOSIS — R110 Nausea: Secondary | ICD-10-CM | POA: Diagnosis not present

## 2020-10-11 DIAGNOSIS — R1011 Right upper quadrant pain: Secondary | ICD-10-CM | POA: Diagnosis not present

## 2020-10-15 DIAGNOSIS — R1011 Right upper quadrant pain: Secondary | ICD-10-CM | POA: Diagnosis not present

## 2020-10-19 DIAGNOSIS — J45909 Unspecified asthma, uncomplicated: Secondary | ICD-10-CM | POA: Diagnosis not present

## 2020-10-25 DIAGNOSIS — R12 Heartburn: Secondary | ICD-10-CM | POA: Diagnosis not present

## 2020-10-25 DIAGNOSIS — R1011 Right upper quadrant pain: Secondary | ICD-10-CM | POA: Diagnosis not present

## 2020-10-25 DIAGNOSIS — R112 Nausea with vomiting, unspecified: Secondary | ICD-10-CM | POA: Diagnosis not present

## 2020-10-29 DIAGNOSIS — R1011 Right upper quadrant pain: Secondary | ICD-10-CM | POA: Diagnosis not present

## 2020-10-31 DIAGNOSIS — R1011 Right upper quadrant pain: Secondary | ICD-10-CM | POA: Diagnosis not present

## 2020-10-31 DIAGNOSIS — K2950 Unspecified chronic gastritis without bleeding: Secondary | ICD-10-CM | POA: Diagnosis not present

## 2020-10-31 DIAGNOSIS — B9681 Helicobacter pylori [H. pylori] as the cause of diseases classified elsewhere: Secondary | ICD-10-CM | POA: Diagnosis not present

## 2020-10-31 DIAGNOSIS — R12 Heartburn: Secondary | ICD-10-CM | POA: Diagnosis not present

## 2020-10-31 DIAGNOSIS — R112 Nausea with vomiting, unspecified: Secondary | ICD-10-CM | POA: Diagnosis not present

## 2020-10-31 DIAGNOSIS — K219 Gastro-esophageal reflux disease without esophagitis: Secondary | ICD-10-CM | POA: Diagnosis not present

## 2020-11-01 DIAGNOSIS — Z79899 Other long term (current) drug therapy: Secondary | ICD-10-CM | POA: Diagnosis not present

## 2020-11-18 DIAGNOSIS — J45909 Unspecified asthma, uncomplicated: Secondary | ICD-10-CM | POA: Diagnosis not present

## 2020-12-04 ENCOUNTER — Encounter: Payer: Self-pay | Admitting: Family

## 2020-12-04 ENCOUNTER — Ambulatory Visit: Payer: Medicare Other | Admitting: Family

## 2020-12-20 DIAGNOSIS — Z79899 Other long term (current) drug therapy: Secondary | ICD-10-CM | POA: Diagnosis not present

## 2021-01-08 DIAGNOSIS — R112 Nausea with vomiting, unspecified: Secondary | ICD-10-CM | POA: Diagnosis not present

## 2021-01-08 DIAGNOSIS — K219 Gastro-esophageal reflux disease without esophagitis: Secondary | ICD-10-CM | POA: Diagnosis not present

## 2021-01-09 DIAGNOSIS — R112 Nausea with vomiting, unspecified: Secondary | ICD-10-CM | POA: Diagnosis not present

## 2021-01-10 DIAGNOSIS — Z79899 Other long term (current) drug therapy: Secondary | ICD-10-CM | POA: Diagnosis not present

## 2021-01-18 DIAGNOSIS — J45909 Unspecified asthma, uncomplicated: Secondary | ICD-10-CM | POA: Diagnosis not present

## 2021-01-23 DIAGNOSIS — Z79899 Other long term (current) drug therapy: Secondary | ICD-10-CM | POA: Diagnosis not present

## 2021-01-24 DIAGNOSIS — G4710 Hypersomnia, unspecified: Secondary | ICD-10-CM | POA: Diagnosis not present

## 2021-01-24 DIAGNOSIS — G43909 Migraine, unspecified, not intractable, without status migrainosus: Secondary | ICD-10-CM | POA: Diagnosis not present

## 2021-01-24 DIAGNOSIS — E782 Mixed hyperlipidemia: Secondary | ICD-10-CM | POA: Diagnosis not present

## 2021-01-24 DIAGNOSIS — Z23 Encounter for immunization: Secondary | ICD-10-CM | POA: Diagnosis not present

## 2021-01-24 DIAGNOSIS — K219 Gastro-esophageal reflux disease without esophagitis: Secondary | ICD-10-CM | POA: Diagnosis not present

## 2021-01-31 DIAGNOSIS — R5383 Other fatigue: Secondary | ICD-10-CM | POA: Diagnosis not present

## 2021-01-31 DIAGNOSIS — G2581 Restless legs syndrome: Secondary | ICD-10-CM | POA: Diagnosis not present

## 2021-01-31 DIAGNOSIS — R400 Somnolence: Secondary | ICD-10-CM | POA: Diagnosis not present

## 2021-01-31 DIAGNOSIS — G4733 Obstructive sleep apnea (adult) (pediatric): Secondary | ICD-10-CM | POA: Diagnosis not present

## 2021-01-31 DIAGNOSIS — J4530 Mild persistent asthma, uncomplicated: Secondary | ICD-10-CM | POA: Diagnosis not present

## 2021-01-31 DIAGNOSIS — F17210 Nicotine dependence, cigarettes, uncomplicated: Secondary | ICD-10-CM | POA: Diagnosis not present

## 2021-02-05 DIAGNOSIS — G4733 Obstructive sleep apnea (adult) (pediatric): Secondary | ICD-10-CM | POA: Diagnosis not present

## 2021-02-06 DIAGNOSIS — Z79899 Other long term (current) drug therapy: Secondary | ICD-10-CM | POA: Diagnosis not present

## 2021-02-18 DIAGNOSIS — J45909 Unspecified asthma, uncomplicated: Secondary | ICD-10-CM | POA: Diagnosis not present

## 2021-02-20 DIAGNOSIS — Z79899 Other long term (current) drug therapy: Secondary | ICD-10-CM | POA: Diagnosis not present

## 2021-03-06 DIAGNOSIS — Z79899 Other long term (current) drug therapy: Secondary | ICD-10-CM | POA: Diagnosis not present

## 2021-03-13 DIAGNOSIS — R0602 Shortness of breath: Secondary | ICD-10-CM | POA: Diagnosis not present

## 2021-03-13 DIAGNOSIS — R0789 Other chest pain: Secondary | ICD-10-CM | POA: Diagnosis not present

## 2021-03-13 DIAGNOSIS — F17210 Nicotine dependence, cigarettes, uncomplicated: Secondary | ICD-10-CM | POA: Diagnosis not present

## 2021-03-13 DIAGNOSIS — R079 Chest pain, unspecified: Secondary | ICD-10-CM | POA: Diagnosis not present

## 2021-03-13 DIAGNOSIS — K209 Esophagitis, unspecified without bleeding: Secondary | ICD-10-CM | POA: Diagnosis not present

## 2021-03-14 DIAGNOSIS — R400 Somnolence: Secondary | ICD-10-CM | POA: Diagnosis not present

## 2021-03-14 DIAGNOSIS — G2581 Restless legs syndrome: Secondary | ICD-10-CM | POA: Diagnosis not present

## 2021-03-14 DIAGNOSIS — F17210 Nicotine dependence, cigarettes, uncomplicated: Secondary | ICD-10-CM | POA: Diagnosis not present

## 2021-03-14 DIAGNOSIS — G4734 Idiopathic sleep related nonobstructive alveolar hypoventilation: Secondary | ICD-10-CM | POA: Diagnosis not present

## 2021-03-14 DIAGNOSIS — R5383 Other fatigue: Secondary | ICD-10-CM | POA: Diagnosis not present

## 2021-03-14 DIAGNOSIS — J4520 Mild intermittent asthma, uncomplicated: Secondary | ICD-10-CM | POA: Diagnosis not present

## 2021-03-15 DIAGNOSIS — J4520 Mild intermittent asthma, uncomplicated: Secondary | ICD-10-CM | POA: Diagnosis not present

## 2021-03-19 DIAGNOSIS — J45909 Unspecified asthma, uncomplicated: Secondary | ICD-10-CM | POA: Diagnosis not present

## 2021-03-20 ENCOUNTER — Other Ambulatory Visit: Payer: Self-pay

## 2021-03-20 ENCOUNTER — Encounter (HOSPITAL_COMMUNITY)
Admission: EM | Disposition: A | Discharge status: Home / Self Care | Payer: Self-pay | Source: Home / Self Care | Attending: Cardiology

## 2021-03-20 ENCOUNTER — Inpatient Hospital Stay (HOSPITAL_COMMUNITY)
Admission: EM | Admit: 2021-03-20 | Discharge: 2021-03-24 | DRG: 246 | Disposition: A | Discharge status: Home / Self Care | Payer: Medicare Other | Attending: Cardiology | Admitting: Cardiology

## 2021-03-20 ENCOUNTER — Encounter (HOSPITAL_COMMUNITY): Payer: Self-pay | Admitting: Cardiology

## 2021-03-20 ENCOUNTER — Emergency Department (HOSPITAL_COMMUNITY): Admit: 2021-03-20 | Payer: Medicare Other | Admitting: Cardiology

## 2021-03-20 DIAGNOSIS — I2109 ST elevation (STEMI) myocardial infarction involving other coronary artery of anterior wall: Secondary | ICD-10-CM

## 2021-03-20 DIAGNOSIS — R0781 Pleurodynia: Secondary | ICD-10-CM

## 2021-03-20 DIAGNOSIS — R7989 Other specified abnormal findings of blood chemistry: Secondary | ICD-10-CM

## 2021-03-20 DIAGNOSIS — Z833 Family history of diabetes mellitus: Secondary | ICD-10-CM | POA: Diagnosis not present

## 2021-03-20 DIAGNOSIS — I5041 Acute combined systolic (congestive) and diastolic (congestive) heart failure: Secondary | ICD-10-CM | POA: Diagnosis not present

## 2021-03-20 DIAGNOSIS — Z720 Tobacco use: Secondary | ICD-10-CM

## 2021-03-20 DIAGNOSIS — Z8249 Family history of ischemic heart disease and other diseases of the circulatory system: Secondary | ICD-10-CM

## 2021-03-20 DIAGNOSIS — R079 Chest pain, unspecified: Secondary | ICD-10-CM

## 2021-03-20 DIAGNOSIS — I219 Acute myocardial infarction, unspecified: Secondary | ICD-10-CM

## 2021-03-20 DIAGNOSIS — I469 Cardiac arrest, cause unspecified: Secondary | ICD-10-CM | POA: Diagnosis present

## 2021-03-20 DIAGNOSIS — I2510 Atherosclerotic heart disease of native coronary artery without angina pectoris: Secondary | ICD-10-CM | POA: Diagnosis present

## 2021-03-20 DIAGNOSIS — E785 Hyperlipidemia, unspecified: Secondary | ICD-10-CM | POA: Diagnosis not present

## 2021-03-20 DIAGNOSIS — I2102 ST elevation (STEMI) myocardial infarction involving left anterior descending coronary artery: Secondary | ICD-10-CM | POA: Diagnosis not present

## 2021-03-20 DIAGNOSIS — R404 Transient alteration of awareness: Secondary | ICD-10-CM | POA: Diagnosis not present

## 2021-03-20 DIAGNOSIS — R778 Other specified abnormalities of plasma proteins: Secondary | ICD-10-CM

## 2021-03-20 DIAGNOSIS — I4901 Ventricular fibrillation: Secondary | ICD-10-CM

## 2021-03-20 DIAGNOSIS — I499 Cardiac arrhythmia, unspecified: Secondary | ICD-10-CM | POA: Diagnosis not present

## 2021-03-20 DIAGNOSIS — R4020 Unspecified coma: Secondary | ICD-10-CM | POA: Diagnosis not present

## 2021-03-20 DIAGNOSIS — Q040 Congenital malformations of corpus callosum: Secondary | ICD-10-CM | POA: Diagnosis not present

## 2021-03-20 DIAGNOSIS — F17210 Nicotine dependence, cigarettes, uncomplicated: Secondary | ICD-10-CM | POA: Diagnosis not present

## 2021-03-20 DIAGNOSIS — G931 Anoxic brain damage, not elsewhere classified: Secondary | ICD-10-CM | POA: Diagnosis not present

## 2021-03-20 DIAGNOSIS — I213 ST elevation (STEMI) myocardial infarction of unspecified site: Secondary | ICD-10-CM | POA: Diagnosis not present

## 2021-03-20 DIAGNOSIS — R6889 Other general symptoms and signs: Secondary | ICD-10-CM | POA: Diagnosis not present

## 2021-03-20 DIAGNOSIS — Z982 Presence of cerebrospinal fluid drainage device: Secondary | ICD-10-CM | POA: Diagnosis not present

## 2021-03-20 DIAGNOSIS — I462 Cardiac arrest due to underlying cardiac condition: Secondary | ICD-10-CM | POA: Diagnosis present

## 2021-03-20 DIAGNOSIS — Z743 Need for continuous supervision: Secondary | ICD-10-CM | POA: Diagnosis not present

## 2021-03-20 DIAGNOSIS — Z20822 Contact with and (suspected) exposure to covid-19: Secondary | ICD-10-CM | POA: Diagnosis not present

## 2021-03-20 DIAGNOSIS — Z79899 Other long term (current) drug therapy: Secondary | ICD-10-CM

## 2021-03-20 DIAGNOSIS — E7841 Elevated Lipoprotein(a): Secondary | ICD-10-CM | POA: Diagnosis not present

## 2021-03-20 DIAGNOSIS — I255 Ischemic cardiomyopathy: Secondary | ICD-10-CM | POA: Diagnosis not present

## 2021-03-20 DIAGNOSIS — S20211S Contusion of right front wall of thorax, sequela: Secondary | ICD-10-CM

## 2021-03-20 DIAGNOSIS — R55 Syncope and collapse: Secondary | ICD-10-CM | POA: Diagnosis not present

## 2021-03-20 DIAGNOSIS — Z955 Presence of coronary angioplasty implant and graft: Secondary | ICD-10-CM

## 2021-03-20 DIAGNOSIS — F32A Depression, unspecified: Secondary | ICD-10-CM | POA: Diagnosis not present

## 2021-03-20 HISTORY — DX: Chest pain, unspecified: R07.9

## 2021-03-20 HISTORY — PX: CORONARY/GRAFT ACUTE MI REVASCULARIZATION: CATH118305

## 2021-03-20 HISTORY — DX: Other specified abnormal findings of blood chemistry: R79.89

## 2021-03-20 HISTORY — PX: LEFT HEART CATH AND CORONARY ANGIOGRAPHY: CATH118249

## 2021-03-20 HISTORY — DX: ST elevation (STEMI) myocardial infarction involving other coronary artery of anterior wall: I21.09

## 2021-03-20 HISTORY — DX: Cardiac arrest, cause unspecified: I46.9

## 2021-03-20 HISTORY — DX: Tobacco use: Z72.0

## 2021-03-20 HISTORY — DX: Ventricular fibrillation: I49.01

## 2021-03-20 LAB — LIPID PANEL
Cholesterol: 199 mg/dL (ref 0–200)
HDL: 45 mg/dL (ref >40)
LDL Cholesterol: 147 mg/dL — ABNORMAL HIGH (ref 0–99)
Total CHOL/HDL Ratio: 4.4 RATIO
Triglycerides: 33 mg/dL (ref <150)
VLDL: 7 mg/dL (ref 0–40)

## 2021-03-20 LAB — POCT I-STAT, CHEM 8
BUN: 4 mg/dL — ABNORMAL LOW (ref 6–20)
Calcium, Ion: 1.1 mmol/L — ABNORMAL LOW (ref 1.15–1.40)
Chloride: 103 mmol/L (ref 98–111)
Creatinine, Ser: 0.5 mg/dL (ref 0.44–1.00)
Glucose, Bld: 170 mg/dL — ABNORMAL HIGH (ref 70–99)
HCT: 38 % (ref 36.0–46.0)
Hemoglobin: 12.9 g/dL (ref 12.0–15.0)
Potassium: 3.5 mmol/L (ref 3.5–5.1)
Sodium: 137 mmol/L (ref 135–145)
TCO2: 19 mmol/L — ABNORMAL LOW (ref 22–32)

## 2021-03-20 LAB — APTT: aPTT: 89 seconds — ABNORMAL HIGH (ref 24–36)

## 2021-03-20 LAB — HEMOGLOBIN A1C
Hgb A1c MFr Bld: 5.1 % (ref 4.8–5.6)
Mean Plasma Glucose: 99.67 mg/dL

## 2021-03-20 LAB — MRSA NEXT GEN BY PCR, NASAL: MRSA by PCR Next Gen: NOT DETECTED

## 2021-03-20 LAB — CBC
HCT: 37.4 % (ref 36.0–46.0)
Hemoglobin: 13.5 g/dL (ref 12.0–15.0)
MCH: 29.6 pg (ref 26.0–34.0)
MCHC: 36.1 g/dL — ABNORMAL HIGH (ref 30.0–36.0)
MCV: 82 fL (ref 80.0–100.0)
Platelets: 276 10*3/uL (ref 150–400)
RBC: 4.56 MIL/uL (ref 3.87–5.11)
RDW: 12.4 % (ref 11.5–15.5)
WBC: 22.4 10*3/uL — ABNORMAL HIGH (ref 4.0–10.5)
nRBC: 0 % (ref 0.0–0.2)

## 2021-03-20 LAB — POCT ACTIVATED CLOTTING TIME
Activated Clotting Time: 239 seconds
Activated Clotting Time: 342 seconds

## 2021-03-20 LAB — TROPONIN I (HIGH SENSITIVITY)
Troponin I (High Sensitivity): 9431 ng/L (ref <18)
Troponin I (High Sensitivity): >24000 ng/L (ref <18)
Troponin I (High Sensitivity): >24000 ng/L (ref <18)

## 2021-03-20 LAB — COMPREHENSIVE METABOLIC PANEL
ALT: 40 U/L (ref 0–44)
AST: 106 U/L — ABNORMAL HIGH (ref 15–41)
Albumin: 3.5 g/dL (ref 3.5–5.0)
Alkaline Phosphatase: 58 U/L (ref 38–126)
Anion gap: 10 (ref 5–15)
BUN: 5 mg/dL — ABNORMAL LOW (ref 6–20)
CO2: 19 mmol/L — ABNORMAL LOW (ref 22–32)
Calcium: 7.6 mg/dL — ABNORMAL LOW (ref 8.9–10.3)
Chloride: 105 mmol/L (ref 98–111)
Creatinine, Ser: 0.78 mg/dL (ref 0.44–1.00)
GFR, Estimated: >60 mL/min (ref >60)
Glucose, Bld: 170 mg/dL — ABNORMAL HIGH (ref 70–99)
Potassium: 3.3 mmol/L — ABNORMAL LOW (ref 3.5–5.1)
Sodium: 134 mmol/L — ABNORMAL LOW (ref 135–145)
Total Bilirubin: 0.8 mg/dL (ref 0.3–1.2)
Total Protein: 5.7 g/dL — ABNORMAL LOW (ref 6.5–8.1)

## 2021-03-20 LAB — GLUCOSE, CAPILLARY
Glucose-Capillary: 162 mg/dL — ABNORMAL HIGH (ref 70–99)
Glucose-Capillary: 191 mg/dL — ABNORMAL HIGH (ref 70–99)
Glucose-Capillary: 135 mg/dL — ABNORMAL HIGH (ref 70–99)

## 2021-03-20 LAB — PROTIME-INR
INR: 1.1 (ref 0.8–1.2)
Prothrombin Time: 14 seconds (ref 11.4–15.2)

## 2021-03-20 LAB — MAGNESIUM: Magnesium: 1.8 mg/dL (ref 1.7–2.4)

## 2021-03-20 SURGERY — CORONARY/GRAFT ACUTE MI REVASCULARIZATION
Anesthesia: LOCAL

## 2021-03-20 MED ORDER — MIDAZOLAM HCL 2 MG/2ML IJ SOLN
INTRAMUSCULAR | Status: DC | PRN
  Administered 2021-03-20: 1 mg via INTRAVENOUS

## 2021-03-20 MED ORDER — LABETALOL HCL 5 MG/ML IV SOLN: 10.0000 mg | INTRAVENOUS | Status: AC | PRN

## 2021-03-20 MED ORDER — POTASSIUM CHLORIDE CRYS ER 20 MEQ PO TBCR
40.0000 meq | EXTENDED_RELEASE_TABLET | Freq: Once | ORAL | Status: AC
  Administered 2021-03-20: 40 meq via ORAL
  Filled 2021-03-20: qty 2

## 2021-03-20 MED ORDER — HEPARIN SODIUM (PORCINE) 5000 UNIT/ML IJ SOLN
5000.0000 [IU] | Freq: Three times a day (TID) | INTRAMUSCULAR | Status: DC
  Administered 2021-03-20 – 2021-03-24 (×10): 5000 [IU] via SUBCUTANEOUS
  Filled 2021-03-20 (×10): qty 1

## 2021-03-20 MED ORDER — SODIUM CHLORIDE 0.9% FLUSH: 3.0000 mL | INTRAVENOUS | Status: DC | PRN

## 2021-03-20 MED ORDER — HEPARIN SODIUM (PORCINE) 1000 UNIT/ML IJ SOLN
INTRAMUSCULAR | Status: AC
  Filled 2021-03-20: qty 10

## 2021-03-20 MED ORDER — FENTANYL CITRATE (PF) 100 MCG/2ML IJ SOLN
INTRAMUSCULAR | Status: DC | PRN
Start: 2021-03-20 — End: 2021-03-20
  Administered 2021-03-20: 25 ug via INTRAVENOUS

## 2021-03-20 MED ORDER — MORPHINE SULFATE (PF) 2 MG/ML IV SOLN
2.0000 mg | INTRAVENOUS | Status: DC | PRN
  Administered 2021-03-20 – 2021-03-21 (×7): 2 mg via INTRAVENOUS
  Filled 2021-03-20 (×7): qty 1

## 2021-03-20 MED ORDER — HYDRALAZINE HCL 20 MG/ML IJ SOLN: 10.0000 mg | INTRAMUSCULAR | Status: AC | PRN

## 2021-03-20 MED ORDER — SODIUM CHLORIDE 0.9% FLUSH
3.0000 mL | Freq: Two times a day (BID) | INTRAVENOUS | Status: DC
  Administered 2021-03-20 – 2021-03-24 (×7): 3 mL via INTRAVENOUS

## 2021-03-20 MED ORDER — HEPARIN (PORCINE) IN NACL 1000-0.9 UT/500ML-% IV SOLN
INTRAVENOUS | Status: AC
  Filled 2021-03-20: qty 1000

## 2021-03-20 MED ORDER — SODIUM CHLORIDE 0.9 % WEIGHT BASED INFUSION
1.0000 mL/kg/h | INTRAVENOUS | Status: AC
  Administered 2021-03-20: 1 mL/kg/h via INTRAVENOUS

## 2021-03-20 MED ORDER — TICAGRELOR 90 MG PO TABS
ORAL_TABLET | ORAL | Status: AC
  Filled 2021-03-20: qty 2

## 2021-03-20 MED ORDER — CARVEDILOL 3.125 MG PO TABS
3.1250 mg | ORAL_TABLET | Freq: Two times a day (BID) | ORAL | Status: DC
Start: 2021-03-20 — End: 2021-03-21
  Administered 2021-03-20 – 2021-03-21 (×3): 3.125 mg via ORAL
  Filled 2021-03-20 (×3): qty 1

## 2021-03-20 MED ORDER — MIDAZOLAM HCL 2 MG/2ML IJ SOLN
INTRAMUSCULAR | Status: AC
  Filled 2021-03-20: qty 2

## 2021-03-20 MED ORDER — LIDOCAINE IN D5W 4-5 MG/ML-% IV SOLN
1.0000 mg/min | INTRAVENOUS | Status: AC
  Administered 2021-03-20: 2 mg/min via INTRAVENOUS

## 2021-03-20 MED ORDER — VERAPAMIL HCL 2.5 MG/ML IV SOLN
INTRAVENOUS | Status: DC | PRN
  Administered 2021-03-20: 10 mL via INTRA_ARTERIAL

## 2021-03-20 MED ORDER — ASPIRIN 81 MG PO CHEW
81.0000 mg | CHEWABLE_TABLET | Freq: Every day | ORAL | Status: DC
  Administered 2021-03-20 – 2021-03-24 (×5): 81 mg via ORAL
  Filled 2021-03-20 (×5): qty 1

## 2021-03-20 MED ORDER — TIROFIBAN HCL IN NACL 5-0.9 MG/100ML-% IV SOLN
INTRAVENOUS | Status: AC | PRN
  Administered 2021-03-20: .15 ug/kg/min via INTRAVENOUS

## 2021-03-20 MED ORDER — TIROFIBAN HCL IN NACL 5-0.9 MG/100ML-% IV SOLN
0.1500 ug/kg/min | INTRAVENOUS | Status: DC
  Administered 2021-03-20: 0.15 ug/kg/min via INTRAVENOUS

## 2021-03-20 MED ORDER — FENTANYL CITRATE (PF) 100 MCG/2ML IJ SOLN
INTRAMUSCULAR | Status: AC
Start: 2021-03-20 — End: ?
  Filled 2021-03-20: qty 2

## 2021-03-20 MED ORDER — HEPARIN (PORCINE) IN NACL 1000-0.9 UT/500ML-% IV SOLN
INTRAVENOUS | Status: DC | PRN
  Administered 2021-03-20 (×2): 500 mL

## 2021-03-20 MED ORDER — MAGNESIUM SULFATE 2 GM/50ML IV SOLN
2.0000 g | Freq: Once | INTRAVENOUS | Status: AC
  Administered 2021-03-20: 2 g via INTRAVENOUS
  Filled 2021-03-20: qty 50

## 2021-03-20 MED ORDER — SODIUM CHLORIDE 0.9 % IV SOLN: 250.0000 mL | INTRAVENOUS | Status: DC | PRN

## 2021-03-20 MED ORDER — ONDANSETRON HCL 4 MG/2ML IJ SOLN
4.0000 mg | Freq: Four times a day (QID) | INTRAMUSCULAR | Status: DC | PRN
  Administered 2021-03-21: 4 mg via INTRAVENOUS
  Filled 2021-03-20: qty 2

## 2021-03-20 MED ORDER — TICAGRELOR 90 MG PO TABS
ORAL_TABLET | ORAL | Status: DC | PRN
  Administered 2021-03-20: 180 mg via ORAL

## 2021-03-20 MED ORDER — SODIUM CHLORIDE 0.9 % IV SOLN
INTRAVENOUS | Status: AC | PRN
  Administered 2021-03-20: 250 mL via INTRAVENOUS

## 2021-03-20 MED ORDER — FUROSEMIDE 10 MG/ML IJ SOLN
40.0000 mg | Freq: Once | INTRAMUSCULAR | Status: AC
  Administered 2021-03-20: 40 mg via INTRAVENOUS
  Filled 2021-03-20: qty 4

## 2021-03-20 MED ORDER — LIDOCAINE HCL (PF) 1 % IJ SOLN
INTRAMUSCULAR | Status: AC
  Filled 2021-03-20: qty 30

## 2021-03-20 MED ORDER — LIDOCAINE HCL (PF) 1 % IJ SOLN
INTRAMUSCULAR | Status: DC | PRN
  Administered 2021-03-20: 2 mL

## 2021-03-20 MED ORDER — CHLORHEXIDINE GLUCONATE CLOTH 2 % EX PADS
6.0000 | MEDICATED_PAD | Freq: Every day | CUTANEOUS | Status: DC
  Administered 2021-03-20 – 2021-03-22 (×2): 6 via TOPICAL

## 2021-03-20 MED ORDER — TICAGRELOR 90 MG PO TABS
90.0000 mg | ORAL_TABLET | Freq: Two times a day (BID) | ORAL | Status: DC
  Administered 2021-03-20 – 2021-03-24 (×9): 90 mg via ORAL
  Filled 2021-03-20 (×9): qty 1

## 2021-03-20 MED ORDER — VERAPAMIL HCL 2.5 MG/ML IV SOLN
INTRAVENOUS | Status: AC
  Filled 2021-03-20: qty 2

## 2021-03-20 MED ORDER — IOHEXOL 350 MG/ML SOLN
INTRAVENOUS | Status: DC | PRN
  Administered 2021-03-20: 145 mL

## 2021-03-20 MED ORDER — NITROGLYCERIN 1 MG/10 ML FOR IR/CATH LAB
INTRA_ARTERIAL | Status: DC | PRN
  Administered 2021-03-20 (×2): 200 ug

## 2021-03-20 MED ORDER — HEPARIN SODIUM (PORCINE) 1000 UNIT/ML IJ SOLN
INTRAMUSCULAR | Status: DC | PRN
  Administered 2021-03-20: 4000 [IU] via INTRAVENOUS
  Administered 2021-03-20: 2000 [IU] via INTRAVENOUS

## 2021-03-20 MED ORDER — TIROFIBAN (AGGRASTAT) BOLUS VIA INFUSION
INTRAVENOUS | Status: DC | PRN
  Administered 2021-03-20: 2017.5 ug via INTRAVENOUS

## 2021-03-20 MED ORDER — ROSUVASTATIN CALCIUM 20 MG PO TABS
40.0000 mg | ORAL_TABLET | Freq: Every day | ORAL | Status: DC
  Administered 2021-03-20 – 2021-03-24 (×5): 40 mg via ORAL
  Filled 2021-03-20 (×5): qty 2

## 2021-03-20 SURGICAL SUPPLY — 18 items
BALLN SAPPHIRE 2.0X12 (BALLOONS) ×2
BALLN SAPPHIRE ~~LOC~~ 3.0X18 (BALLOONS) ×1 IMPLANT
BALLOON SAPPHIRE 2.0X12 (BALLOONS) IMPLANT
CATH OPTITORQUE TIG 4.0 5F (CATHETERS) ×1 IMPLANT
CATH VISTA GUIDE 6FR XBLAD3.5 (CATHETERS) ×1 IMPLANT
DEVICE RAD COMP TR BAND LRG (VASCULAR PRODUCTS) ×1 IMPLANT
GLIDESHEATH SLEND SS 6F .021 (SHEATH) ×1 IMPLANT
GUIDEWIRE INQWIRE 1.5J.035X260 (WIRE) IMPLANT
INQWIRE 1.5J .035X260CM (WIRE) ×2
KIT ENCORE 26 ADVANTAGE (KITS) ×1 IMPLANT
KIT HEART LEFT (KITS) ×2 IMPLANT
PACK CARDIAC CATHETERIZATION (CUSTOM PROCEDURE TRAY) ×2 IMPLANT
STENT SYNERGY XD 2.50X28 (Permanent Stent) IMPLANT
SYNERGY XD 2.50X28 (Permanent Stent) ×2 IMPLANT
SYR MEDRAD MARK 7 150ML (SYRINGE) ×2 IMPLANT
TRANSDUCER W/STOPCOCK (MISCELLANEOUS) ×2 IMPLANT
TUBING CIL FLEX 10 FLL-RA (TUBING) ×2 IMPLANT
WIRE ASAHI PROWATER 180CM (WIRE) ×1 IMPLANT

## 2021-03-20 NOTE — Progress Notes (Addendum)
Progress Note  Patient Name: Maria Ellis Date of Encounter: 03/20/2021  Primary Cardiologist: Bryan Lemma, MD   Subjective   No chest pain or SOB at bedside. Patient is eager to ambulate. She does note that she has a history of HLD with tchol in the 400s. She had been taking a lipid lower medication. She also noted that she had a brother who died at a young age from cardiac arrest, exact etiology was unclear.   Inpatient Medications    Scheduled Meds:  aspirin  81 mg Oral Daily   carvedilol  3.125 mg Oral BID WC   Chlorhexidine Gluconate Cloth  6 each Topical Daily   heparin  5,000 Units Subcutaneous Q8H   rosuvastatin  40 mg Oral Daily   sodium chloride flush  3 mL Intravenous Q12H   ticagrelor  90 mg Oral BID   Continuous Infusions:  sodium chloride     sodium chloride 1 mL/kg/hr (03/20/21 0644)   PRN Meds: sodium chloride, hydrALAZINE, labetalol, morphine injection, ondansetron (ZOFRAN) IV, sodium chloride flush   Vital Signs    Vitals:   03/20/21 0758 03/20/21 0800 03/20/21 0900 03/20/21 1000  BP:  (!) 103/91 109/70 107/75  Pulse:  (!) 103 98 (!) 103  Resp:  15 (!) 22 (!) 36  Temp: 98.4 F (36.9 C)     TempSrc: Oral     SpO2:  99% 98% 99%  Weight:      Height:        Intake/Output Summary (Last 24 hours) at 03/20/2021 1044 Last data filed at 03/20/2021 1015 Gross per 24 hour  Intake 227.07 ml  Output 3250 ml  Net -3022.93 ml    I/O since admission:   Filed Weights   03/20/21 0517 03/20/21 0626  Weight: 80.7 kg 87.6 kg    Telemetry    Sinus tachycardia - Personally Reviewed  ECG    03/20/2021 ECG (independently read by me): Tachycardia at 112 bpm, T wave inversion V2 through V4.  Physical Exam    BP 107/75    Pulse (!) 103    Temp 98.4 F (36.9 C) (Oral)    Resp (!) 36    Ht 5\' 3"  (1.6 m)    Wt 87.6 kg    SpO2 99%    BMI 34.21 kg/m  General: Alert, oriented, no distress. Lying on hospital bed.  Skin: normal turgor, no rashes, warm and  dry HEENT: Normocephalic, atraumatic. Pupils equal round and reactive to light; sclera anicteric; Nose without nasal septal hypertrophy Mouth/Parynx benign;  Neck: No JVD, no carotid bruits; normal carotid upstroke Lungs: clear to ausculatation and percussion; no wheezing or rales Chest wall: without tenderness to palpitation Heart: PMI not displaced, RRR, s1 s2 normal, no diastolic murmur, no rubs, gallops, thrills, or heaves, no edema of ble  Abdomen: soft, nontender; no hepatosplenomehaly, BS+;  Back: no CVA tenderness Pulses 2+ Musculoskeletal: full range of motion, normal strength, no joint deformities Extremities: no clubbing cyanosis or edema,  Neurologic: grossly nonfocal; Cranial nerves grossly wnl Psychologic: Normal mood and affect    Labs    Chemistry Recent Labs  Lab 03/20/21 0525 03/20/21 0526  NA 137 134*  K 3.5 3.3*  CL 103 105  CO2  --  19*  GLUCOSE 170* 170*  BUN 4* 5*  CREATININE 0.50 0.78  CALCIUM  --  7.6*  PROT  --  5.7*  ALBUMIN  --  3.5  AST  --  106*  ALT  --  40  ALKPHOS  --  58  BILITOT  --  0.8  GFRNONAA  --  >60  ANIONGAP  --  10     Hematology Recent Labs  Lab 03/20/21 0525 03/20/21 0526  WBC  --  22.4*  RBC  --  4.56  HGB 12.9 13.5  HCT 38.0 37.4  MCV  --  82.0  MCH  --  29.6  MCHC  --  36.1*  RDW  --  12.4  PLT  --  276    Cardiac Enzymes Component 03/20/2021 Ref Range & Units 05:26  Troponin I (High Sensitivity) <18 ng/L 9,431 High Panic       Lipid Panel     Component Value Date/Time   CHOL 199 03/20/2021 0526   TRIG 33 03/20/2021 0526   HDL 45 03/20/2021 0526   CHOLHDL 4.4 03/20/2021 0526   VLDL 7 03/20/2021 0526   LDLCALC 147 (H) 03/20/2021 0526     Radiology    CARDIAC CATHETERIZATION  Result Date: 03/20/2021   CULPRIT LESION: Prox LAD to Mid LAD lesion is 100% stenosed.   A drug-eluting stent was successfully placed using a SYNERGY XD [id number].  Postdilated to 3.1 mm.   Post intervention, there is a 0%  residual stenosis.   --------------------------------------   Otherwise angiographically normal RCA, RI and LCx   --------------------------------------   There is moderate left ventricular systolic dysfunction.  The left ventricular ejection fraction is 35-45% by visual estimate.   LV end diastolic pressure is severely elevated.   There is no aortic valve stenosis. SUMMARY Cardiac arrest with ventricular fibrillation and ROSC secondary to Anterior STEMI Severe single-vessel CAD with 100% thrombotic occlusion of the LAD just after D1 Successful DES PCI of LAD restoring TIMI-3 flow using Synergy XD DES 2.5 x 28 mm stent postdilated to 3.1 mm. Acute combined systolic and diastolic heart failure: EF estimated roughly 40% by LV gram with EDP of 28 to 30 mmHg. RECOMMENDATIONS We will continue lidocaine and Aggrastat drip for 2 to 3 hours post PCI to allow for further stabilization Check 2D echocardiogram on March 2 to allow time for stabilization Given 4 mg IV Lasix post-cath-reassess Start low-dose beta-blocker-carvedilol 3.125 mg twice daily.  We will need to titrate as blood pressure tolerates and consider addition of ARB/ARNI and spironolactone. Increase statin dose to 80 mg daily Smoking station counseling Bryan Lemma, MD   Cardiac Studies   SUMMARY Cardiac arrest with ventricular fibrillation and ROSC secondary to Anterior STEMI Severe single-vessel CAD with 100% thrombotic occlusion of the LAD just after D1 Successful DES PCI of LAD restoring TIMI-3 flow using Synergy XD DES 2.5 x 28 mm stent postdilated to 3.1 mm. Acute combined systolic and diastolic heart failure: EF estimated roughly 40% by LV gram with EDP of 28 to 30 mmHg.     RECOMMENDATIONS We will continue lidocaine and Aggrastat drip for 2 to 3 hours post PCI to allow for further stabilization Check 2D echocardiogram on March 2 to allow time for stabilization Given 4 mg IV Lasix post-cath-reassess Start low-dose beta-blocker-carvedilol  3.125 mg twice daily.  We will need to titrate as blood pressure tolerates and consider addition of ARB/ARNI and spironolactone. Increase statin dose to 80 mg daily Smoking station counseling  Diagnostic Dominance: Right Intervention     Patient Profile     36 y.o. 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   Assessment & Plan    Anterior STEMI Complete occlusion of LAD  Patient currently without symptoms. Has history of cocaine use (with +U tox at [facility]), hyperlipidemia on atorvastatin unclear adherence. LDL remains elevated to 147. She is s/p PCI w/ DES to LAD. EF estimated to be 40% on LV gram  -Continue ASA, and will start ticagrelor this AM  -Continue coreg, mild sinus tachycardia, however soft blood pressures in low 100s. Continue to monitor -Start rosuvastatin 40mg  qd this AM for secondary prevention -D/C lidocaine infusion, replete lytes  -Will check Lpa  -F/u Echo, pending results will place patient on GDMT for reduced EF heart failure as her blood pressure allows.   HLD -Start rosuvastatin as noted per above   Cocaine use -Will continue to counsel patient on cessation    , MD PGY-2 Internal Medicine  Pager [phone] 03/20/2021, 10:44 AM      Patient seen and examined. Agree with assessment and plan.  Patient was admitted earlier this morning by Dr. 05/20/2021 in the setting of anterior ST segment elevation myocardial infarction.  Personally reviewed the catheterization angios and reviewed findings with the patient as well as her mother.  Patient has strong family history for premature coronary disease with her brother dying in his 4s.  She states she has had significant history of hyperlipidemia and remotely total cholesterols were in excess of 400.  Most recently she has been on  atorvastatin LDL on presentation was elevated at 147.  Patient was found by brother unresponsive, CPR was administered, and when EMS arrived she was successfully resuscitated and shocked x2 with ROSC.  Upon arrival to [facility] from [facility] she was on lidocaine drip and had been on heparin and transient.  Currently she is chest pain-free.  Initial troponin at: 9431 which increased to greater than 24,000.  Blood count elevated at 22.4.  Initial creatinine 0.78.  AST is increased at 106 most likely secondary to her MI.  I had a long discussion with both the patient as well as her mother.  She most likely had acute plaque rupture resulting in thrombotic occlusion of her proximal LAD.  Patient also admits to history of snoring and symptoms are worrisome for struct of sleep apnea.  We will repeat echo Doppler study with initial LV at 40% on left ventriculography.  ECG this morning postprocedure shows sinus tachycardia at 112 bpm with T wave inversion in leads V2 through V4.  Continue with carvedilol in light of her sinus tachycardia.  Potassium is low at 3.3 will replete to greater than 4.  Magnesium 1.8 will replete to 2.0.  Will initiate rosuvastatin 40 mg.  If total cholesterols in the past are over 400 high likelihood for familial hyperlipidemia.  She may require Zetia plus rosuvastatin in addition to PCSK9 inhibition.  Check LP(a) ; rhythm is currently stable without ectopy.  Will DC lidocaine.  Follow-up laboratory.  Plan to initiate ARB therapy as blood pressure allows.  Will ultimately need an outpatient sleep study for high likelihood of obstructive sleep apnea which may have contributed to nocturnal hypoxemia in the setting of increased inflammation event acute plaque rupture and nocturnal ischemia.  CC time : 40 minutes   [facility], MD, [facility] 03/20/2021 3:20 PM

## 2021-03-20 NOTE — H&P (Addendum)
Cardiology Admission History and Physical:   Patient ID: TRENISHA RUNCK MRN: [id number]; DOB: 1985/04/08   Admission date: 03/20/2021  PCP:  Patient, No Pcp Per (Inactive)   [facility] Providers Cardiologist:  None        Chief Complaint: Post VF arrest-anterior ST elevation  Patient Profile:   Maria Ellis is a 36 y.o. 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  History of Present Illness:   Maria Ellis has been seen several times at the [facility] ER in the last month or 2 for chest pain evaluation.  Most recently was 2 weeks ago where she ruled out for MI with negative troponins and normal EKG.  She apparently been doing relatively well her normal state of health otherwise.  She did test positive for cocaine on urine tox screen at that time, but vehemently denies using cocaine.  She does use marijuana.  On the morning of March 1, her significant other came home found her unresponsive on the couch, EMS called.  Upon arrival EMS found her to be in ventricular fibrillation.  She was shocked twice along with CPR.  She apparently pulled out her [employer] Pace airway in transit to [facility].  She had ROSC and was initially quite agitated when pulling the [facility] airway.  She did receive 2 Versed in the ER and steadily stabilized.  She is now wide-awake.  She continued to have right-sided chest pain and EKG now showed new EKG changes with subtle 2 to 3 mm ST elevations in V2 and V3.  No further arrhythmia.  She is on a lidocaine drip.  Was given 4000 units of IV heparin.  24 mg aspirin.  She has been on IV heparin drip in transit.  Initially started on nitroglycerin infusion, but had low blood pressures leading to that nitroglycerin being discontinued and given normal saline bolus of 1 L.  Pressures are now  stabilized roughly 107 over 78 mmHg.  Upon arrival to [facility] she continues to have complaints of right-sided chest pain.  EKG reviewed this still shows the subtle changes in the anteroseptal leads.  She denies any nausea at present.  Has a headache.  No dyspnea.  Troponin I was 10.49 at [facility].  There was delay the diagnosis due to subtle changes in the EKG post ROSC.  After comparison to the previous EKG showed that there indeed was a difference albeit very subtle, we chose to activate code STEMI for postarrest.    Past Medical History:  Diagnosis Date   Bell's palsy    Headache    History of Chiari malformation    Hydrocephalus [facility])     Past Surgical History:  Procedure Laterality Date   BRAIN SURGERY     LAPAROSCOPIC APPENDECTOMY Left 05/18/2013   Procedure: APPENDECTOMY LAPAROSCOPIC;  Surgeon: Earnstine Regal, MD;  Location: [facility] ORS;  Service: General;  Laterality: Left;   MULTIPLE TOOTH EXTRACTIONS     SHUNT REPLACEMENT     x 2   TONSILECTOMY, ADENOIDECTOMY, BILATERAL MYRINGOTOMY AND TUBES       Medications Prior to Admission: Prior to Admission medications   Medication Sig Start Date End Date Taking? Authorizing Provider  atorvastatin (LIPITOR) 40 MG tablet 40 mg daily. 10/02/16   [provider]  cholecalciferol (VITAMIN D) 1000  units tablet Take 1,000 Units by mouth daily.    [provider]  clindamycin (CLEOCIN) 150 MG capsule Take 1 capsule (150 mg total) by mouth every 6 (six) hours. 06/22/18   Bast, Tressia Miners A, NP  fluticasone (FLONASE) 50 MCG/ACT nasal spray Place 2 sprays into both nostrils daily. 03/12/16   [provider]  furosemide (LASIX) 20 MG tablet Take 20 mg by mouth daily. 04/11/16   [provider]  hydrocortisone cream 0.5 % Apply 1 application topically 2 (two) times daily. 04/18/16   Delos Haring, PA-C  hydrOXYzine (ATARAX/VISTARIL) 10 MG tablet TAKE 1 TABLET BY MOUTH AM AND AT LUNCH, THEN TAKE 2  TABLETS AT BEDTIME 04/08/16   [provider]  omeprazole (PRILOSEC) 40 MG capsule 40 mg daily. 08/28/16   [provider]  OVER THE COUNTER MEDICATION OTC Benadryl allergy, takes 1/2 as needed    [provider]  potassium chloride (K-DUR,KLOR-CON) 10 MEQ tablet Take 10 mEq by mouth 2 (two) times daily. 04/11/16   [provider]  PROAIR HFA 108 (90 Base) MCG/ACT inhaler Inhale 2 puffs into the lungs every 4 (four) hours as needed for wheezing or shortness of breath.  04/11/16   [provider]  promethazine (PHENERGAN) 25 MG tablet Take 25 mg by mouth every 6 (six) hours as needed for nausea.  03/12/16   [provider]  rizatriptan (MAXALT-MLT) 10 MG disintegrating tablet Take 1 tablet (10 mg total) by mouth as needed. May repeat in 2 hours if needed 04/06/17   Dennie Bible, NP  topiramate (TOPAMAX) 100 MG tablet Take 150 mg by mouth daily.  04/11/16   [provider]     Allergies:    Allergies  Allergen Reactions   Multihance [Gadobenate] Nausea Only and Cough    PT HAD SNEEZING AND NAUSEA IMMEDIATELY AFTER CONTRAST INJECTION   Penicillins Swelling    Has patient had a PCN reaction causing immediate rash, facial/tongue/throat swelling, SOB or lightheadedness with hypotension: No Has patient had a PCN reaction causing severe rash involving mucus membranes or skin necrosis: Yes Has patient had a PCN reaction that required hospitalization No Has patient had a PCN reaction occurring within the last 10 years: Yes If all of the above answers are "NO", then may proceed with Cephalosporin use.    Tylenol [Acetaminophen] Rash and Other (See Comments)    Stomach cramping    Social History:   Social History   Socioeconomic History   Marital status: Single    Spouse name: Not on file   Number of children: 0   Years of education: 12   Highest education level: Not on file  Occupational History   Occupation: Disabled  Tobacco  Use   Smoking status: Every Day    Types: Cigarettes   Smokeless tobacco: Never   Tobacco comments:    Trying to quit - smoking about 2 cigarettes per week.  Substance and Sexual Activity   Alcohol use: No    Alcohol/week: 0.0 standard drinks   Drug use: No   Sexual activity: Yes    Birth control/protection: None  Other Topics Concern   Not on file  Social History Narrative   Lives at home with her mother.   Left-handed.   8-9 cups caffeine per day.   Social Determinants of Health   Financial Resource Strain: Not on file  Food Insecurity: Not on file  Transportation Needs: Not on file  Physical Activity: Not on file  Stress:  Not on file  Social Connections: Not on file  Intimate Partner Violence: Not on file    Family History:   The patient's family history includes Cirrhosis in her father; Diabetes in her father and mother; Heart failure in her father.    ROS:  Please see the history of present illness.  Not able to review Patient is somewhat confused and tired.  Not able to provide additional information for All other ROS reviewed and negative.     Physical Exam/Data:   Vitals:   03/20/21 0607 03/20/21 0612 03/20/21 0617 03/20/21 0626  BP: 105/87 120/86 120/86   Pulse: (!) 103 (!) 0 (!) 0 (!) 114  Resp:    (!) 23  Temp:    98.3 F (36.8 C)  TempSrc:    Oral  SpO2: 99%   100%  Weight:    87.6 kg  Height:    5\' 3"  (1.6 m)   No intake or output data in the 24 hours ending 03/20/21 0630 Last 3 Weights 03/20/2021 03/20/2021 06/22/2018  Weight (lbs) 193 lb 2 oz 177 lb 14.6 oz 165 lb  Weight (kg) 87.6 kg 80.7 kg 74.844 kg     Body mass index is 34.21 kg/m.  General:  Well nourished, well developed, in mild distress.  Somewhat groggy but answers questions appropriately. HEENT: normal Neck: no JVD Vascular: No carotid bruits; Distal pulses 2+ bilaterally   Cardiac:  normal S1, S2; RRR; no murmur rubs or gallops. Lungs:  clear to auscultation bilaterally, no  wheezing, rhonchi or rales  Abd: soft, nontender, no hepatomegaly  Ext: no clubbing cyanosis or edema Musculoskeletal:  No deformities, BUE and BLE strength normal and equal Skin: warm and dry  Neuro:  CNs 2-12 intact, no focal abnormalities noted Psych:  Normal affect    EKG:  The ECG that was done postarrest upon arrival to the ER roughly 3 AM was personally reviewed and demonstrates a sinus rhythm with subtle 2 mm ST elevations in V2 and V3 new from 2 weeks prior.  Being postarrest, this is concerning for possible ST elevation MI.  Therefore code STEMI called.  Relevant CV Studies: None  Laboratory Data:  High Sensitivity Troponin:   Recent Labs  Lab 03/20/21 0526  TROPONINIHS 9,431*       Chemistry Recent Labs  Lab 03/20/21 0525 03/20/21 0526  NA 137 134*  K 3.5 3.3*  CL 103 105  CO2  --  19*  GLUCOSE 170* 170*  BUN 4* 5*  CREATININE 0.50 0.78  CALCIUM  --  7.6*  GFRNONAA  --  >60  ANIONGAP  --  10     Recent Labs  Lab 03/20/21 0526  PROT 5.7*  ALBUMIN 3.5  AST 106*  ALT 40  ALKPHOS 58  BILITOT 0.8   Lipids  Recent Labs  Lab 03/20/21 0526  CHOL 199  TRIG 33  HDL 45  LDLCALC 147*  CHOLHDL 4.4   Hematology Recent Labs  Lab 03/20/21 0525 03/20/21 0526  WBC  --  22.4*  RBC  --  4.56  HGB 12.9 13.5  HCT 38.0 37.4  MCV  --  82.0  MCH  --  29.6  MCHC  --  36.1*  RDW  --  12.4  PLT  --  276   Thyroid No results for input(s): TSH, FREET4 in the last 168 hours. BNPNo results for input(s): BNP, PROBNP in the last 168 hours.  DDimer No results for input(s): DDIMER in the  last 168 hours.   Radiology/Studies:  No results found.   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  Found to have 100% LAD after small D1.  Treated with DES stent.  EF roughly the 35 to 40% with severe anterior hypokinesis.  LVEDP 28-30 millimeter mercury.  She will be admitted to the CVICU.  Wait till tomorrow to check 2D echo. IV Lasix 40 mg x 1.  Reassess after. Run lidocaine until rounding team decides to stop.-Anticipate 3 to 4 hours. Aggrastat for 3 hours. We will attempt to start low-dose carvedilol 3.125 mg twice daily.  As blood pressure tolerates, will need to likely consider ARB/ARNI and possibly spironolactone. Started on atorvastatin 80 mg daily DAPT times minimum 1 year. Smoking cessation counseling 1 which I think will resolve      Risk Assessment/Risk Scores:   TIMI Risk Score for ST  Elevation MI:   The patient's TIMI risk score is 1, which indicates a 1.6% risk of all cause mortality at 30 days.       Severity of Illness: The appropriate patient status for this patient is INPATIENT. Inpatient status is judged to be reasonable and necessary in order to provide the required intensity of service to ensure the patient's safety. The patient's presenting symptoms, physical exam findings, and initial radiographic and laboratory data in the context of their chronic comorbidities is felt to place them at high risk for further clinical deterioration. Furthermore, it is not anticipated that the patient will be medically stable for discharge from the hospital within 2 midnights of admission.   * I certify that at the point of admission it is my clinical judgment that the patient will require inpatient hospital care spanning beyond 2 midnights from the point of admission due to high intensity of service, high risk for further deterioration and high frequency of surveillance required.*   For questions or updates, please contact [facility] Please consult  [email or web address] for contact info under     Signed, Glenetta Hew, MD  03/20/2021 6:30 AM

## 2021-03-21 ENCOUNTER — Inpatient Hospital Stay (HOSPITAL_COMMUNITY): Payer: Medicare Other

## 2021-03-21 ENCOUNTER — Other Ambulatory Visit (HOSPITAL_COMMUNITY): Payer: Self-pay

## 2021-03-21 DIAGNOSIS — E785 Hyperlipidemia, unspecified: Secondary | ICD-10-CM

## 2021-03-21 DIAGNOSIS — R079 Chest pain, unspecified: Secondary | ICD-10-CM

## 2021-03-21 DIAGNOSIS — I469 Cardiac arrest, cause unspecified: Secondary | ICD-10-CM

## 2021-03-21 DIAGNOSIS — E7841 Elevated Lipoprotein(a): Secondary | ICD-10-CM

## 2021-03-21 DIAGNOSIS — I213 ST elevation (STEMI) myocardial infarction of unspecified site: Secondary | ICD-10-CM

## 2021-03-21 DIAGNOSIS — I4901 Ventricular fibrillation: Secondary | ICD-10-CM

## 2021-03-21 LAB — ECHOCARDIOGRAM COMPLETE
AR max vel: 2.16 cm2
AV Area VTI: 2.14 cm2
AV Area mean vel: 2.06 cm2
AV Mean grad: 2 mmHg
AV Peak grad: 3.5 mmHg
Ao pk vel: 0.93 m/s
Area-P 1/2: 4.99 cm2
Calc EF: 37.6 %
Height: 63 in
S' Lateral: 4.4 cm
Single Plane A2C EF: 26.3 %
Single Plane A4C EF: 46.8 %
Weight: 3089.97 oz

## 2021-03-21 LAB — BASIC METABOLIC PANEL
Anion gap: 9 (ref 5–15)
BUN: 8 mg/dL (ref 6–20)
CO2: 23 mmol/L (ref 22–32)
Calcium: 8.4 mg/dL — ABNORMAL LOW (ref 8.9–10.3)
Chloride: 107 mmol/L (ref 98–111)
Creatinine, Ser: 0.74 mg/dL (ref 0.44–1.00)
GFR, Estimated: >60 mL/min (ref >60)
Glucose, Bld: 112 mg/dL — ABNORMAL HIGH (ref 70–99)
Potassium: 3.3 mmol/L — ABNORMAL LOW (ref 3.5–5.1)
Sodium: 139 mmol/L (ref 135–145)

## 2021-03-21 LAB — CBC
HCT: 39.2 % (ref 36.0–46.0)
Hemoglobin: 13.2 g/dL (ref 12.0–15.0)
MCH: 28.5 pg (ref 26.0–34.0)
MCHC: 33.7 g/dL (ref 30.0–36.0)
MCV: 84.7 fL (ref 80.0–100.0)
Platelets: 284 10*3/uL (ref 150–400)
RBC: 4.63 MIL/uL (ref 3.87–5.11)
RDW: 12.6 % (ref 11.5–15.5)
WBC: 19.6 10*3/uL — ABNORMAL HIGH (ref 4.0–10.5)
nRBC: 0 % (ref 0.0–0.2)

## 2021-03-21 LAB — HEPATIC FUNCTION PANEL
ALT: 51 U/L — ABNORMAL HIGH (ref 0–44)
AST: 153 U/L — ABNORMAL HIGH (ref 15–41)
Albumin: 3.7 g/dL (ref 3.5–5.0)
Alkaline Phosphatase: 68 U/L (ref 38–126)
Bilirubin, Direct: 0.1 mg/dL (ref 0.0–0.2)
Indirect Bilirubin: 0.8 mg/dL (ref 0.3–0.9)
Total Bilirubin: 0.9 mg/dL (ref 0.3–1.2)
Total Protein: 6.3 g/dL — ABNORMAL LOW (ref 6.5–8.1)

## 2021-03-21 LAB — MAGNESIUM: Magnesium: 2.2 mg/dL (ref 1.7–2.4)

## 2021-03-21 LAB — LIPOPROTEIN A (LPA): Lipoprotein (a): 82.6 nmol/L — ABNORMAL HIGH (ref <75.0)

## 2021-03-21 MED ORDER — PERFLUTREN LIPID MICROSPHERE
1.0000 mL | INTRAVENOUS | Status: AC | PRN
  Administered 2021-03-21: 2 mL via INTRAVENOUS

## 2021-03-21 MED ORDER — ACETAMINOPHEN 325 MG PO TABS: 650.0000 mg | ORAL_TABLET | Freq: Four times a day (QID) | ORAL | Status: DC | PRN

## 2021-03-21 MED ORDER — METOPROLOL SUCCINATE ER 25 MG PO TB24
12.5000 mg | ORAL_TABLET | Freq: Every day | ORAL | Status: DC
  Administered 2021-03-21 – 2021-03-23 (×2): 12.5 mg via ORAL
  Filled 2021-03-21 (×3): qty 1

## 2021-03-21 MED ORDER — POTASSIUM CHLORIDE CRYS ER 20 MEQ PO TBCR
40.0000 meq | EXTENDED_RELEASE_TABLET | Freq: Two times a day (BID) | ORAL | Status: AC
  Administered 2021-03-21 (×2): 40 meq via ORAL
  Filled 2021-03-21 (×2): qty 2

## 2021-03-21 MED ORDER — OXYCODONE HCL 5 MG PO TABS
5.0000 mg | ORAL_TABLET | ORAL | Status: DC | PRN
  Administered 2021-03-21 – 2021-03-24 (×14): 5 mg via ORAL
  Filled 2021-03-21 (×14): qty 1

## 2021-03-21 MED ORDER — MORPHINE SULFATE (PF) 2 MG/ML IV SOLN
2.0000 mg | INTRAVENOUS | Status: DC | PRN
  Administered 2021-03-21 (×2): 2 mg via INTRAVENOUS
  Filled 2021-03-21 (×2): qty 1

## 2021-03-21 MED ORDER — LIDOCAINE 5 % EX PTCH
1.0000 | MEDICATED_PATCH | CUTANEOUS | Status: DC
  Administered 2021-03-21 – 2021-03-24 (×4): 1 via TRANSDERMAL
  Filled 2021-03-21 (×4): qty 1

## 2021-03-21 NOTE — Hospital Course (Signed)
? ?Progress Note ? ?Patient Name: Minda Bajaj Witters ?Date of Encounter: 03/21/2021 ? ?Primary Cardiologist: *** ? ?Subjective  ? ?*** ? ?Inpatient Medications  ?  ?Scheduled Meds: ? aspirin  81 mg Oral Daily  ? Chlorhexidine Gluconate Cloth  6 each Topical Daily  ? heparin  5,000 Units Subcutaneous Q8H  ? lidocaine  1 patch Transdermal Q24H  ? metoprolol succinate  12.5 mg Oral Daily  ? potassium chloride  40 mEq Oral BID  ? rosuvastatin  40 mg Oral Daily  ? sodium chloride flush  3 mL Intravenous Q12H  ? ticagrelor  90 mg Oral BID  ? ?Continuous Infusions: ? sodium chloride    ? ?PRN Meds: ?sodium chloride, morphine injection, ondansetron (ZOFRAN) IV, oxyCODONE, sodium chloride flush  ? ?Vital Signs  ?  ?Vitals:  ? 03/21/21 0200 03/21/21 0300 03/21/21 0700 03/21/21 0737  ?BP:  100/74 103/67 103/67  ?Pulse: 87 98 97 93  ?Resp: (!) 24 (!) 28 (!) 22   ?Temp:      ?TempSrc:      ?SpO2: 97% 100% 95%   ?Weight:      ?Height:      ? ? ?Intake/Output Summary (Last 24 hours) at 03/21/2021 0859 ?Last data filed at 03/21/2021 [id number] ?Gross per 24 hour  ?Intake 1463.11 ml  ?Output 3550 ml  ?Net -2086.89 ml  ? ? ?I/O since admission:  ? ?Filed Weights  ? 03/20/21 0517 03/20/21 0626  ?Weight: 80.7 kg 87.6 kg  ? ? ?Telemetry  ?  ?*** - Personally Reviewed ? ?ECG  ?  ?ECG (independently read by me): ? ?Physical Exam  ? ? ?BP 103/67   Pulse 93   Temp 98.7 ?F (37.1 ?C) (Oral)   Resp (!) 22   Ht 5\' 3"  (1.6 m)   Wt 87.6 kg   SpO2 95%   BMI 34.21 kg/m?  ?General: Alert, oriented, no distress.  ?Skin: normal turgor, no rashes, warm and dry ?HEENT: Normocephalic, atraumatic. Pupils equal round and reactive to light; sclera anicteric; extraocular muscles intact; Fundi ** ?Nose without nasal septal hypertrophy ?Mouth/Parynx benign; Mallinpatti scale ?Neck: No JVD, no carotid bruits; normal carotid upstroke ?Lungs: clear to ausculatation and percussion; no wheezing or rales ?Chest wall: without tenderness to palpitation ?Heart: PMI not  displaced, RRR, s1 s2 normal, 1/6 systolic murmur, no diastolic murmur, no rubs, gallops, thrills, or heaves ?Abdomen: soft, nontender; no hepatosplenomehaly, BS+; abdominal aorta nontender and not dilated by palpation. ?Back: no CVA tenderness ?Pulses 2+ ?Musculoskeletal: full range of motion, normal strength, no joint deformities ?Extremities: no clubbing cyanosis or edema, Homan's sign negative  ?Neurologic: grossly nonfocal; Cranial nerves grossly wnl ?Psychologic: Normal mood and affect ? ? ? ? ?Labs  ?  ?Chemistry ?Recent Labs  ?Lab 03/20/21 ?[id number] 03/20/21 ?[id number] 03/21/21 ?0125  ?NA 137 134* 139  ?K 3.5 3.3* 3.3*  ?CL 103 105 107  ?CO2  --  19* 23  ?GLUCOSE 170* 170* 112*  ?BUN 4* 5* 8  ?CREATININE 0.50 0.78 0.74  ?CALCIUM  --  7.6* 8.4*  ?PROT  --  5.7*  --   ?ALBUMIN  --  3.5  --   ?AST  --  106*  --   ?ALT  --  40  --   ?ALKPHOS  --  58  --   ?BILITOT  --  0.8  --   ?GFRNONAA  --  >60 >60  ?ANIONGAP  --  10 9  ?  ? ?Hematology ?Recent Labs  ?  Lab 03/20/21 ?[id number] 03/20/21 ?[id number] 03/21/21 ?0125  ?WBC  --  22.4* 19.6*  ?RBC  --  4.56 4.63  ?HGB 12.9 13.5 13.2  ?HCT 38.0 37.4 39.2  ?MCV  --  82.0 84.7  ?MCH  --  29.6 28.5  ?MCHC  --  36.1* 33.7  ?RDW  --  12.4 12.6  ?PLT  --  276 284  ? ? ?Cardiac EnzymesNo results for input(s): TROPONINI in the last 168 hours. No results for input(s): TROPIPOC in the last 168 hours.  ? ?BNPNo results for input(s): BNP, PROBNP in the last 168 hours.  ? ?DDimer No results for input(s): DDIMER in the last 168 hours.  ? ?Lipid Panel  ?   ?Component Value Date/Time  ? CHOL 199 03/20/2021 0526  ? TRIG 33 03/20/2021 0526  ? HDL 45 03/20/2021 0526  ? CHOLHDL 4.4 03/20/2021 0526  ? VLDL 7 03/20/2021 0526  ? [place] 147 (H) 03/20/2021 0526  ?  ? ?Radiology  ?  ?CARDIAC CATHETERIZATION ? ?Result Date: 03/20/2021 ?  CULPRIT LESION: Prox LAD to Mid LAD lesion is 100% stenosed.   A drug-eluting stent was successfully placed using a SYNERGY XD 2.50X28.  Postdilated to 3.1 mm.   Post intervention,  there is a 0% residual stenosis.   --------------------------------------   Otherwise angiographically normal RCA, RI and LCx   --------------------------------------   There is moderate left ventricular systolic dysfunction.  The left ventricular ejection fraction is 35-45% by visual estimate.   LV end diastolic pressure is severely elevated.   There is no aortic valve stenosis. SUMMARY Cardiac arrest with ventricular fibrillation and ROSC secondary to Anterior STEMI Severe single-vessel CAD with 100% thrombotic occlusion of the LAD just after D1 Successful DES PCI of LAD restoring TIMI-3 flow using Synergy XD DES 2.5 x 28 mm stent postdilated to 3.1 mm. Acute combined systolic and diastolic heart failure: EF estimated roughly 40% by LV gram with EDP of 28 to 30 mmHg. RECOMMENDATIONS We will continue lidocaine and Aggrastat drip for 2 to 3 hours post PCI to allow for further stabilization Check 2D echocardiogram on March 2 to allow time for stabilization Given 4 mg IV Lasix post-cath-reassess Start low-dose beta-blocker-carvedilol 3.125 mg twice daily.  We will need to titrate as blood pressure tolerates and consider addition of ARB/ARNI and spironolactone. Increase statin dose to 80 mg daily Smoking station counseling Glenetta Hew, MD  ? ?Cardiac Studies  ? ?*** ? ?Patient Profile  ?   ?36 y.o. female *** ? ?Assessment & Plan  ?  ?*** ? ?Signed, ?Troy Sine, MD, FACC ?03/21/2021, 8:59 AM    ?

## 2021-03-21 NOTE — TOC Benefit Eligibility Note (Signed)
Patient Advocate Encounter ? ?Insurance verification completed.   ? ?The patient is currently admitted and upon discharge could be taking Brilinta 90 mg. ? ?The current 30 day co-pay is, $0.00.  ? ?The patient is insured through [employer] Medicare Part D  ? ? ? ?Hershal Eriksson, CPhT ?Pharmacy Patient Advocate Specialist ?[facility] Patient Advocate Team ?Direct Number: [phone]  Fax: [phone] ? ? ? ? ? ?  ?

## 2021-03-21 NOTE — Progress Notes (Signed)
CARDIAC REHAB PHASE I  ? ?PRE:  Rate/Rhythm: 80 SR ? ?  BP: sitting 92/58 ? ?  SaO2: 96 RA ? ?MODE:  Ambulation: 330 ft  ? ?POST:  Rate/Rhythm: 120 ST ? ?  BP: sitting 108/81  ? ?  SaO2: 98 RA ? ?Pt with significant right rib pain but willing to walk. SOB with HR elevated, required x3 rest stops. Denied CP/angina.  ? ?Discussed with pt and significant other MI, stent, Brilinta importance, restrictions, smoking cessation, diet, exercise, NTG and CRPII. Pt very receptive, many questions/comments. Eager to do CRPII and will refer to [facility] CRPII. Pt wants to quit smoking. Discussed methods. She is most tempted when with friends. Encouraged another walk tonight. ?[phone]  ? ?Maudine Kluesner Ethelda Chick CES, ACSM ?03/21/2021 ?2:31 PM ? ? ? ? ?

## 2021-03-21 NOTE — Progress Notes (Addendum)
Progress Note  Patient Name: Maria Ellis Date of Encounter: 03/21/2021  Primary Cardiologist: Glenetta Hew, MD   Subjective   Patient denies chest pain or SOB at bedside. She is having some R sided rib cage pain. She feels this is a result of the cpr and she is requiring q2h use of her morphine.   Inpatient Medications    Scheduled Meds:  aspirin  81 mg Oral Daily   Chlorhexidine Gluconate Cloth  6 each Topical Daily   heparin  5,000 Units Subcutaneous Q8H   lidocaine  1 patch Transdermal Q24H   metoprolol succinate  12.5 mg Oral Daily   potassium chloride  40 mEq Oral BID   rosuvastatin  40 mg Oral Daily   sodium chloride flush  3 mL Intravenous Q12H   ticagrelor  90 mg Oral BID   Continuous Infusions:  sodium chloride     PRN Meds: sodium chloride, morphine injection, ondansetron (ZOFRAN) IV, oxyCODONE, sodium chloride flush   Vital Signs    Vitals:   03/21/21 0200 03/21/21 0300 03/21/21 0700 03/21/21 0737  BP:  100/74 103/67 103/67  Pulse: 87 98 97 93  Resp: (!) 24 (!) 28 (!) 22   Temp:      TempSrc:      SpO2: 97% 100% 95%   Weight:      Height:        Intake/Output Summary (Last 24 hours) at 03/21/2021 0858 Last data filed at 03/21/2021 0748 Gross per 24 hour  Intake 1463.11 ml  Output 3550 ml  Net -2086.89 ml     I/O since admission:   Filed Weights   03/20/21 0517 03/20/21 0626  Weight: 80.7 kg 87.6 kg    Telemetry    NSR, HR in the 90s - Personally Reviewed  ECG    03/20/2021 ECG (independently read by me): Tachycardia at 112 bpm, T wave inversion V2 through V4.  Physical Exam   BP 103/67    Pulse 93    Temp 98.7 F (37.1 C) (Oral)    Resp (!) 22    Ht 5\' 3"  (1.6 m)    Wt 87.6 kg    SpO2 95%    BMI 34.21 kg/m   General: Alert, oriented, no distress.  Skin: normal turgor, no rashes, warm and dry HEENT: Normocephalic, atraumatic. Pupils equal round and reactive to light; sclera anicteric; Nose without nasal septal  hypertrophy Mouth/Parynx benign; Mallinpatti scale Neck: No JVD, no carotid bruits; normal carotid upstroke Lungs: clear to ausculatation and percussion; no wheezing or rales Chest wall: with tenderness to palpitation of R anterolateral  Heart: PMI not displaced, RRR, s1 s2 normal, no diastolic murmur, no rubs, gallops, thrills, or heaves Abdomen: soft, nontender; no hepatosplenomehaly, BS+; abdominal aorta nontender and not dilated by palpation. Back: no CVA tenderness Pulses 2+ Musculoskeletal: full range of motion, normal strength, no joint deformities Extremities: no clubbing cyanosis or edema, Homan's sign negative  Neurologic: grossly nonfocal; Cranial nerves grossly wnl Psychologic: Normal mood and affect  Labs    Chemistry Recent Labs  Lab 03/20/21 0525 03/20/21 0526 03/21/21 0125  NA 137 134* 139  K 3.5 3.3* 3.3*  CL 103 105 107  CO2  --  19* 23  GLUCOSE 170* 170* 112*  BUN 4* 5* 8  CREATININE 0.50 0.78 0.74  CALCIUM  --  7.6* 8.4*  PROT  --  5.7*  --   ALBUMIN  --  3.5  --   AST  --  106*  --   ALT  --  40  --   ALKPHOS  --  58  --   BILITOT  --  0.8  --   GFRNONAA  --  >60 >60  ANIONGAP  --  10 9        Hematology Recent Labs  Lab 03/20/21 0525 03/20/21 0526 03/21/21 0125  WBC  --  22.4* 19.6*  RBC  --  4.56 4.63  HGB 12.9 13.5 13.2  HCT 38.0 37.4 39.2  MCV  --  82.0 84.7  MCH  --  29.6 28.5  MCHC  --  36.1* 33.7  RDW  --  12.4 12.6  PLT  --  276 284     Cardiac Enzymes Component 03/20/2021 Ref Range & Units 05:26  Troponin I (High Sensitivity) <18 ng/L 9,431 High Panic       Lipid Panel     Component Value Date/Time   CHOL 199 03/20/2021 0526   TRIG 33 03/20/2021 0526   HDL 45 03/20/2021 0526   CHOLHDL 4.4 03/20/2021 0526   VLDL 7 03/20/2021 0526   LDLCALC 147 (H) 03/20/2021 0526      Radiology    CARDIAC CATHETERIZATION  Result Date: 03/20/2021   CULPRIT LESION: Prox LAD to Mid LAD lesion is 100% stenosed.   A drug-eluting  stent was successfully placed using a SYNERGY XD 2.50X28.  Postdilated to 3.1 mm.   Post intervention, there is a 0% residual stenosis.   --------------------------------------   Otherwise angiographically normal RCA, RI and LCx   --------------------------------------   There is moderate left ventricular systolic dysfunction.  The left ventricular ejection fraction is 35-45% by visual estimate.   LV end diastolic pressure is severely elevated.   There is no aortic valve stenosis. SUMMARY Cardiac arrest with ventricular fibrillation and ROSC secondary to Anterior STEMI Severe single-vessel CAD with 100% thrombotic occlusion of the LAD just after D1 Successful DES PCI of LAD restoring TIMI-3 flow using Synergy XD DES 2.5 x 28 mm stent postdilated to 3.1 mm. Acute combined systolic and diastolic heart failure: EF estimated roughly 40% by LV gram with EDP of 28 to 30 mmHg. RECOMMENDATIONS We will continue lidocaine and Aggrastat drip for 2 to 3 hours post PCI to allow for further stabilization Check 2D echocardiogram on March 2 to allow time for stabilization Given 4 mg IV Lasix post-cath-reassess Start low-dose beta-blocker-carvedilol 3.125 mg twice daily.  We will need to titrate as blood pressure tolerates and consider addition of ARB/ARNI and spironolactone. Increase statin dose to 80 mg daily Smoking station counseling Glenetta Hew, MD   Cardiac Studies   SUMMARY Cardiac arrest with ventricular fibrillation and ROSC secondary to Anterior STEMI Severe single-vessel CAD with 100% thrombotic occlusion of the LAD just after D1 Successful DES PCI of LAD restoring TIMI-3 flow using Synergy XD DES 2.5 x 28 mm stent postdilated to 3.1 mm. Acute combined systolic and diastolic heart failure: EF estimated roughly 40% by LV gram with EDP of 28 to 30 mmHg.     RECOMMENDATIONS Check 2D echocardiogram on March 2 to allow time for stabilization Given 4 mg IV Lasix post-cath-reassess Start low-dose  beta-blocker-carvedilol 3.125 mg twice daily.  We will need to titrate as blood pressure tolerates and consider addition of ARB/ARNI and spironolactone. Increase statin dose to 80 mg daily Smoking station counseling  Diagnostic Dominance: Right Intervention     Patient Profile     36 y.o. 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   Assessment & Plan    Anterior STEMI Complete occlusion of LAD  Patient currently without cardiac symptoms.  Has history of cocaine use (with +U tox at [facility]), hyperlipidemia on atorvastatin unclear adherence. LDL remains elevated to 147. Her Lpa was also elevated to 82.6. She is s/p PCI w/ DES to LAD. EF estimated to be 40% on LV gram. She has some soft pressures as yesterday, mostly in [id number] systolic denies lightheadedness or dizziness. EKG this AM, NSR, t wave inversions V2-6, deepening of t waves in V4-6.  -Continue ASA, and ticagrelor this AM  -Change coreg to low dose metoprolol -Continue rosuvastatin 40mg  qd this AM for secondary prevention, will need referral to lipid clinic  -replete lytes  -F/u Echo   HLD -Continue rosuvastatin as noted per above, given Lpa may need referral to lipid clinic   Cocaine use -Will continue to counsel patient on cessation    Rick Duff, MD PGY-2 Internal Medicine  Pager [phone] 03/21/2021, 8:58 AM      Patient seen and examined. Agree with assessment and plan.  Patient feels improved today but complains of soreness in the right lower rib area most likely due to CPR compressions.  Will check chest x-ray to assess for possible rib fracture.  She is not having any recurrent anginal symptomatology.  She specifically denies any recent cocaine use and has not used this in "years."  Troponins greater than 24,000.  ECG today shows progressive  evolutionary anterior T wave inversion in leads V2 through V6 with sinus rhythm at 97.  Follow-up echo is scheduled for today. QTc is prolonged at 528 ms.  She is now on DAPT with aspirin/ticagrelor.  We will change carvedilol to low-dose metoprolol succinate her blood pressure presently is soft.  Now on rosuvastatin 40 mg for hyperlipidemia.  LP(a) is mildly increased at 82.6.   Troy Sine, MD, [facility] 03/21/2021 11:10 AM

## 2021-03-21 NOTE — Progress Notes (Incomplete)
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 EMS found her in VF, she was given CPR and shocked twice in the field before ROSC. EKG showed ST elevations.   Allergies  Allergen Reactions   Penicillins Anaphylaxis and Swelling        Iodinated Contrast Media Other (See Comments)    Body felt like it was on fire.   Multihance [Gadobenate] Nausea Only and Cough    PT HAD SNEEZING AND NAUSEA IMMEDIATELY AFTER CONTRAST INJECTION.     Tylenol [Acetaminophen] Rash and Other (See Comments)    Stomach cramping     Past Medical History:  Diagnosis Date   Bell's palsy    Headache    History of Chiari malformation    Hydrocephalus (HCC)       Family History  Problem Relation Age of Onset   Diabetes Mother    Diabetes Father    Heart failure Father    Cirrhosis Father     Social History   Socioeconomic History   Marital status: Single    Spouse name: Not on file   Number of children: 0   Years of education: 12   Highest education level: Not on file  Occupational History   Occupation: Disabled  Tobacco Use   Smoking status: Every Day    Types: Cigarettes   Smokeless tobacco: Never   Tobacco comments:    Trying to quit - smoking about 2 cigarettes per week.  Substance and Sexual Activity   Alcohol use: No    Alcohol/week: 0.0 standard drinks   Drug use: No   Sexual activity: Yes    Birth control/protection: None  Other Topics Concern   Not on file  Social History Narrative   Lives at home with her mother.   Left-handed.   8-9 cups caffeine per day.   Social Determinants of Health   Financial Resource Strain: Not on file  Food Insecurity: Not on file  Transportation Needs: Not on file  Physical Activity: Not on file  Stress: Not on file  Social Connections: Not on file     Today's Vitals   03/21/21 0800  03/21/21 0801 03/21/21 0900 03/21/21 1000  BP: (!) 89/62  98/65 99/68  Pulse: 88  94 95  Resp: 17  (!) 34 (!) 27  Temp:      TempSrc:      SpO2: 99%  97% 98%  Weight:      Height:      PainSc:  0-No pain     Body mass index is 34.21 kg/m.   Labs:  Imaging: Medications:  A/P  STEMI Now s/p PCI with DES to LAD. EF 40% and NSR on EKG; I/O -3.2L; hsTn > 24k; Qtc 528 -switch coreg to toprol d/t soft pressures (90-100s/60-70s) -continue ASA, ticagrelor x 1 year -continue crestor 40mg  QD -consider nitrostat  -f/u echo  HLD LDL 147, Lpa 82 on Lipitor 40 PTA -continue rosuvastatin 40 -add zetia, consider PCSK9 (LDL goal < 55)   HFrEF EF GDMT: Toprol 12.5, Losartan 12.5mg  -consider spiro, SGLT2 -losartan not given, start today, titrate to 12.5 BID per MD   Anticoag: Hgb 13.2, Plts 284  -Heparin 5000u Q8H, ASA 81, Brilinta 90mg   Endocrine: A1c 5.1, glu levels unremarkable  GI/Nutrition: AST/ALT 153/51  Neuro: pain 6 >> 0, oxycodone PRN  Renal: Scr  0.74, K 3.3 (40 mEq x2), Mg 2.2 (2g IV 3/1) UDS positive for cocaine   Heme/Onc: WBC 22.4 >> 19.6, afebrile  PTA Med Issues: Aimovig, Citalopram 20, Famotidine 20mg , Fluticasone nasal, Olopatadine opth, Pantoprazole 40mg , Proair PRN, promethazine, Rizatriptan 10mg , Suboxone, Topamax 50mg , Vit D 50k, carafate 1g HS  Additional Notes:   Citalopram in ACS with VF? Topamax + Rizatriptan?   24H: No acute changes, tolerating cardiac rehab CXR noted cortical discontinuity along sternum -- nondisplaced fracture s/p CPR? BMP, CBC, renal fxn normal Pressures soft, CTM ECHO 35-40%; titrate losartan, consider spiro+SGLT2

## 2021-03-22 ENCOUNTER — Other Ambulatory Visit (HOSPITAL_COMMUNITY): Payer: Self-pay

## 2021-03-22 LAB — COMPREHENSIVE METABOLIC PANEL
ALT: 41 U/L (ref 0–44)
AST: 48 U/L — ABNORMAL HIGH (ref 15–41)
Albumin: 3.7 g/dL (ref 3.5–5.0)
Alkaline Phosphatase: 69 U/L (ref 38–126)
Anion gap: 10 (ref 5–15)
BUN: 11 mg/dL (ref 6–20)
CO2: 21 mmol/L — ABNORMAL LOW (ref 22–32)
Calcium: 9 mg/dL (ref 8.9–10.3)
Chloride: 108 mmol/L (ref 98–111)
Creatinine, Ser: 0.68 mg/dL (ref 0.44–1.00)
GFR, Estimated: >60 mL/min (ref >60)
Glucose, Bld: 97 mg/dL (ref 70–99)
Potassium: 4.2 mmol/L (ref 3.5–5.1)
Sodium: 139 mmol/L (ref 135–145)
Total Bilirubin: 0.3 mg/dL (ref 0.3–1.2)
Total Protein: 6.4 g/dL — ABNORMAL LOW (ref 6.5–8.1)

## 2021-03-22 LAB — CBC WITH DIFFERENTIAL/PLATELET
Abs Immature Granulocytes: 0.04 10*3/uL (ref 0.00–0.07)
Basophils Absolute: 0.1 10*3/uL (ref 0.0–0.1)
Basophils Relative: 1 %
Eosinophils Absolute: 0.4 10*3/uL (ref 0.0–0.5)
Eosinophils Relative: 3 %
HCT: 39 % (ref 36.0–46.0)
Hemoglobin: 12.8 g/dL (ref 12.0–15.0)
Immature Granulocytes: 0 %
Lymphocytes Relative: 39 %
Lymphs Abs: 5.1 10*3/uL — ABNORMAL HIGH (ref 0.7–4.0)
MCH: 28.8 pg (ref 26.0–34.0)
MCHC: 32.8 g/dL (ref 30.0–36.0)
MCV: 87.6 fL (ref 80.0–100.0)
Monocytes Absolute: 1.1 10*3/uL — ABNORMAL HIGH (ref 0.1–1.0)
Monocytes Relative: 8 %
Neutro Abs: 6.4 10*3/uL (ref 1.7–7.7)
Neutrophils Relative %: 49 %
Platelets: 270 10*3/uL (ref 150–400)
RBC: 4.45 MIL/uL (ref 3.87–5.11)
RDW: 12.7 % (ref 11.5–15.5)
WBC: 13.1 10*3/uL — ABNORMAL HIGH (ref 4.0–10.5)
nRBC: 0 % (ref 0.0–0.2)

## 2021-03-22 MED ORDER — LOSARTAN POTASSIUM 25 MG PO TABS
12.5000 mg | ORAL_TABLET | Freq: Every day | ORAL | Status: DC
  Administered 2021-03-22 – 2021-03-24 (×3): 12.5 mg via ORAL
  Filled 2021-03-22 (×3): qty 1

## 2021-03-22 MED ORDER — EZETIMIBE 10 MG PO TABS
10.0000 mg | ORAL_TABLET | Freq: Every day | ORAL | Status: DC
  Administered 2021-03-22 – 2021-03-24 (×3): 10 mg via ORAL
  Filled 2021-03-22 (×3): qty 1

## 2021-03-22 MED ORDER — DAPAGLIFLOZIN PROPANEDIOL 10 MG PO TABS
10.0000 mg | ORAL_TABLET | Freq: Every day | ORAL | Status: DC
  Administered 2021-03-22 – 2021-03-24 (×3): 10 mg via ORAL
  Filled 2021-03-22 (×3): qty 1

## 2021-03-22 NOTE — Progress Notes (Signed)
CARDIAC REHAB PHASE I  ? ?PRE:  Rate/Rhythm: 85 SR ? ?  BP: sitting 100/72 ? ?  SaO2: 100 RA ? ?MODE:  Ambulation: 410 ft  ? ?POST:  Rate/Rhythm: 101 ST ? ?  BP: sitting 113/81  ? ?  SaO2: 98 RA ? ?Tolerated walk better today, less SOB, less rest, HR controlled, presumed more pain control. Encouraged more walking today, reviewed ed with pt and mother who had many questions. Gave low sodium diet sheets and told to watch for fluid but many distractions currently therefore could review this. TOC c/s placed. ?[phone]  ? ?Lilinoe Acklin Ethelda Chick CES, ACSM ?03/22/2021 ?9:46 AM ? ? ? ? ?

## 2021-03-22 NOTE — Progress Notes (Addendum)
Progress Note  Patient Name: Maria Ellis Date of Encounter: 03/22/2021  Primary Cardiologist: Bryan Lemma, MD   Subjective   Feels better today.  No shortness of breath or angina.  Still notes some right-sided rib discomfort.  Inpatient Medications    Scheduled Meds:  aspirin  81 mg Oral Daily   Chlorhexidine Gluconate Cloth  6 each Topical Daily   heparin  5,000 Units Subcutaneous Q8H   lidocaine  1 patch Transdermal Q24H   metoprolol succinate  12.5 mg Oral Daily   rosuvastatin  40 mg Oral Daily   sodium chloride flush  3 mL Intravenous Q12H   ticagrelor  90 mg Oral BID   Continuous Infusions:  sodium chloride     PRN Meds: sodium chloride, morphine injection, ondansetron (ZOFRAN) IV, oxyCODONE, sodium chloride flush   Vital Signs    Vitals:   03/22/21 0400 03/22/21 0500 03/22/21 0600 03/22/21 0744  BP: (!) 93/56 (!) 97/54  107/69  Pulse: 71 74 84 78  Resp: 18 (!) 30 (!) 25 (!) 24  Temp:    98.6 F (37 C)  TempSrc:    Oral  SpO2: 93% 98% 96% 97%  Weight:      Height:        Intake/Output Summary (Last 24 hours) at 03/22/2021 0841 Last data filed at 03/22/2021 0600 Gross per 24 hour  Intake --  Output 1850 ml  Net -1850 ml    I/O since admission:   Filed Weights   03/20/21 0517 03/20/21 0626  Weight: 80.7 kg 87.6 kg    Telemetry    Sinus rhythm heart rate now in the mid 70- Personally Reviewed  ECG    03/22/2021 ECG (independently read by me): Normal sinus rhythm at 77 bpm.  Evolving inferior and anterolateral T wave inversion.  03/20/2021 ECG (independently read by me): Tachycardia at 112 bpm, T wave inversion V2 through V4.  Physical Exam   BP 107/69    Pulse 78    Temp 98.6 F (37 C) (Oral)    Resp (!) 24    Ht 5\' 3"  (1.6 m)    Wt 87.6 kg    SpO2 97%    BMI 34.21 kg/m  General: Alert, oriented, no distress.  Skin: normal turgor, no rashes, warm and dry HEENT: Normocephalic, atraumatic. Pupils equal round and reactive to light; sclera  anicteric; extraocular muscles intact; Fundi ** Nose without nasal septal hypertrophy Mouth/Parynx benign; Mallinpatti scale Neck: No JVD, no carotid bruits; normal carotid upstroke Lungs: clear to ausculatation and percussion; no wheezing or rales Chest wall: without tenderness to palpitation Heart: PMI not displaced, RRR, s1 s2 normal, 1/6 systolic murmur, no diastolic murmur, no rubs, gallops, thrills, or heaves Abdomen: soft, nontender; no hepatosplenomehaly, BS+; abdominal aorta nontender and not dilated by palpation. Back: no CVA tenderness Pulses 2+ Musculoskeletal: full range of motion, normal strength, no joint deformities Extremities: no clubbing cyanosis or edema, Homan's sign negative  Neurologic: grossly nonfocal; Cranial nerves grossly wnl Psychologic: Normal mood and affect     Labs    Chemistry Recent Labs  Lab 03/20/21 0526 03/21/21 0125 03/22/21 0224  NA 134* 139 139  K 3.3* 3.3* 4.2  CL 105 107 108  CO2 19* 23 21*  GLUCOSE 170* 112* 97  BUN 5* 8 11  CREATININE 0.78 0.74 0.68  CALCIUM 7.6* 8.4* 9.0  PROT 5.7* 6.3* 6.4*  ALBUMIN 3.5 3.7 3.7  AST 106* 153* 48*  ALT 40 51* 41  ALKPHOS 58 68 69  BILITOT 0.8 0.9 0.3  GFRNONAA >60 >60 >60  ANIONGAP 10 9 10        Hematology Recent Labs  Lab 03/20/21 0525 03/20/21 0526 03/21/21 0125  WBC  --  22.4* 19.6*  RBC  --  4.56 4.63  HGB 12.9 13.5 13.2  HCT 38.0 37.4 39.2  MCV  --  82.0 84.7  MCH  --  29.6 28.5  MCHC  --  36.1* 33.7  RDW  --  12.4 12.6  PLT  --  276 284    Cardiac Enzymes  HS Troponin:  9431 > >24,000  Lipid Panel     Component Value Date/Time   CHOL 199 03/20/2021 0526   TRIG 33 03/20/2021 0526   HDL 45 03/20/2021 0526   CHOLHDL 4.4 03/20/2021 0526   VLDL 7 03/20/2021 0526   LDLCALC 147 (H) 03/20/2021 0526     Radiology    DG Chest 1 View  Result Date: 03/21/2021 CLINICAL DATA:  Chest pain, contusion EXAM: CHEST  1 VIEW COMPARISON:  Chest x-ray 03/20/2021 FINDINGS:  Heart size and mediastinal contours are within normal limits. No suspicious pulmonary opacities identified. No pleural effusion or pneumothorax visualized. Right-sided ventriculoperitoneal shunt catheter. No acute osseous abnormality appreciated. IMPRESSION: No acute intrathoracic process identified. Electronically Signed   By: 05/20/2021 M.D.   On: 03/21/2021 09:16   DG Chest 2 View  Result Date: 03/21/2021 CLINICAL DATA:  Right rib pain after CPR. EXAM: CHEST - 2 VIEW COMPARISON:  Earlier today FINDINGS: The heart size and mediastinal contours are within normal limits. Both lungs are clear. Right sided ventriculoperitoneal shunt catheter is again noted. There is a focal area of cortical discontinuity along the mid body of sternum which may reflect nondisplaced fracture status post CPR. IMPRESSION: 1. No acute cardiopulmonary abnormalities. 2. Cortical discontinuity along the mid body of the sternum may reflect nondisplaced fracture status post CPR. Correlate for any focal tenderness along the sternum. Electronically Signed   By: 05/21/2021 M.D.   On: 03/21/2021 12:18   ECHOCARDIOGRAM COMPLETE  Result Date: 03/21/2021    ECHOCARDIOGRAM REPORT   Patient Name:   Maria Ellis Date of Exam: 03/21/2021 Medical Rec #:  05/21/2021     Height:       63.0 in Accession #:    [id number]    Weight:       193.1 lb Date of Birth:  09-Nov-1985     BSA:          1.905 m Patient Age:    36 years      BP:           99/68 mmHg Patient Gender: F             HR:           82 bpm. Exam Location:  Inpatient Procedure: 2D Echo, Cardiac Doppler, Color Doppler and Intracardiac            Opacification Agent Indications:    CAD MI  History:        Patient has no prior history of Echocardiogram examinations.                 3/1/20223 cath with stent placement.  Sonographer:    05/20/2020 RDCS Referring Phys: 73 DAVID W HARDING IMPRESSIONS  1. Left ventricular ejection fraction, by estimation, is 35 to 40%. Left ventricular  ejection fraction by 2D MOD biplane is 37.6 %. The  left ventricle has moderately decreased function. The left ventricle demonstrates regional wall motion abnormalities (see scoring diagram/findings for description). Left ventricular diastolic parameters are consistent with Grade I diastolic dysfunction (impaired relaxation). There is moderate hypokinesis of the left ventricular, entire septal wall, anteroseptal wall and apical segment. Findings suggestive of LAD territory ischemia/infarct.  2. Right ventricular systolic function is normal. The right ventricular size is normal. Tricuspid regurgitation signal is inadequate for assessing PA pressure.  3. The mitral valve is grossly normal. Trivial mitral valve regurgitation.  4. The aortic valve is tricuspid. Aortic valve regurgitation is not visualized.  5. The inferior vena cava is normal in size with greater than 50% respiratory variability, suggesting right atrial pressure of 3 mmHg. Comparison(s): No prior Echocardiogram. FINDINGS  Left Ventricle: Left ventricular ejection fraction, by estimation, is 35 to 40%. Left ventricular ejection fraction by 2D MOD biplane is 37.6 %. The left ventricle has moderately decreased function. The left ventricle demonstrates regional wall motion abnormalities. Moderate hypokinesis of the left ventricular, entire septal wall, anteroseptal wall and apical segment. Definity contrast agent was given IV to delineate the left ventricular endocardial borders. The left ventricular internal cavity size was normal in size. There is no left ventricular hypertrophy. Left ventricular diastolic parameters are consistent with Grade I diastolic dysfunction (impaired relaxation). Indeterminate filling pressures. Right Ventricle: The right ventricular size is normal. No increase in right ventricular wall thickness. Right ventricular systolic function is normal. Tricuspid regurgitation signal is inadequate for assessing PA pressure. Left Atrium:  Left atrial size was normal in size. Right Atrium: Right atrial size was normal in size. Pericardium: There is no evidence of pericardial effusion. Mitral Valve: The mitral valve is grossly normal. Trivial mitral valve regurgitation. Tricuspid Valve: The tricuspid valve is grossly normal. Tricuspid valve regurgitation is not demonstrated. Aortic Valve: The aortic valve is tricuspid. Aortic valve regurgitation is not visualized. Aortic valve mean gradient measures 2.0 mmHg. Aortic valve peak gradient measures 3.5 mmHg. Aortic valve area, by VTI measures 2.14 cm. Pulmonic Valve: The pulmonic valve was grossly normal. Pulmonic valve regurgitation is not visualized. Aorta: The aortic root and ascending aorta are structurally normal, with no evidence of dilitation. Venous: The inferior vena cava is normal in size with greater than 50% respiratory variability, suggesting right atrial pressure of 3 mmHg. IAS/Shunts: No atrial level shunt detected by color flow Doppler.  LEFT VENTRICLE PLAX 2D                        Biplane EF (MOD) LVIDd:         5.40 cm         LV Biplane EF:   Left LVIDs:         4.40 cm                          ventricular LV PW:         0.80 cm                          ejection LV IVS:        0.70 cm                          fraction by LVOT diam:     1.80 cm  2D MOD LV SV:         38                               biplane is LV SV Index:   20                               37.6 %. LVOT Area:     2.54 cm                                Diastology                                LV e' medial:    7.18 cm/s LV Volumes (MOD)               LV E/e' medial:  9.6 LV vol d, MOD    60.8 ml       LV e' lateral:   4.80 cm/s A2C:                           LV E/e' lateral: 14.3 LV vol d, MOD    87.2 ml A4C: LV vol s, MOD    44.8 ml A2C: LV vol s, MOD    46.4 ml A4C: LV SV MOD A2C:   16.0 ml LV SV MOD A4C:   87.2 ml LV SV MOD BP:    27.5 ml RIGHT VENTRICLE RV Basal diam:  2.80 cm RV Mid diam:     1.90 cm RV S prime:     12.60 cm/s LEFT ATRIUM             Index        RIGHT ATRIUM           Index LA Vol (A2C):   28.5 ml 14.96 ml/m  RA Area:     11.70 cm LA Vol (A4C):   29.5 ml 15.48 ml/m  RA Volume:   28.40 ml  14.91 ml/m LA Biplane Vol: 30.3 ml 15.90 ml/m  AORTIC VALVE                    PULMONIC VALVE AV Area (Vmax):    2.16 cm     PV Vmax:       0.71 m/s AV Area (Vmean):   2.06 cm     PV Vmean:      57.500 cm/s AV Area (VTI):     2.14 cm     PV VTI:        0.157 m AV Vmax:           93.00 cm/s   PV Peak grad:  2.0 mmHg AV Vmean:          71.900 cm/s  PV Mean grad:  1.0 mmHg AV VTI:            0.177 m AV Peak Grad:      3.5 mmHg AV Mean Grad:      2.0 mmHg LVOT Vmax:         79.00 cm/s LVOT Vmean:        58.200 cm/s LVOT VTI:          0.149 m  LVOT/AV VTI ratio: 0.84  AORTA Ao Asc diam: 3.10 cm MITRAL VALVE MV Area (PHT): 4.99 cm    SHUNTS MV Decel Time: 152 msec    Systemic VTI:  0.15 m MV E velocity: 68.60 cm/s  Systemic Diam: 1.80 cm MV A velocity: 51.80 cm/s MV E/A ratio:  1.32 Zoila Shutter MD Electronically signed by Zoila Shutter MD Signature Date/Time: 03/21/2021/12:58:51 PM    Final     Cardiac Studies   SUMMARY Cardiac arrest with ventricular fibrillation and ROSC secondary to Anterior STEMI Severe single-vessel CAD with 100% thrombotic occlusion of the LAD just after D1 Successful DES PCI of LAD restoring TIMI-3 flow using Synergy XD DES 2.5 x 28 mm stent postdilated to 3.1 mm. Acute combined systolic and diastolic heart failure: EF estimated roughly 40% by LV gram with EDP of 28 to 30 mmHg.     RECOMMENDATIONS Check 2D echocardiogram on March 2 to allow time for stabilization Given 4 mg IV Lasix post-cath-reassess Start low-dose beta-blocker-carvedilol 3.125 mg twice daily.  We will need to titrate as blood pressure tolerates and consider addition of ARB/ARNI and spironolactone. Increase statin dose to 80 mg daily Smoking station counseling  Diagnostic Dominance:  Right Intervention    ECHO: 03/21/2021  1. Left ventricular ejection fraction, by estimation, is 35 to 40%. Left  ventricular ejection fraction by 2D MOD biplane is 37.6 %. The left  ventricle has moderately decreased function. The left ventricle  demonstrates regional wall motion abnormalities  (see scoring diagram/findings for description). Left ventricular diastolic  parameters are consistent with Grade I diastolic dysfunction (impaired  relaxation). There is moderate hypokinesis of the left ventricular, entire  septal wall, anteroseptal wall  and apical segment. Findings suggestive of LAD territory ischemia/infarct.   2. Right ventricular systolic function is normal. The right ventricular  size is normal. Tricuspid regurgitation signal is inadequate for assessing  PA pressure.   3. The mitral valve is grossly normal. Trivial mitral valve  regurgitation.   4. The aortic valve is tricuspid. Aortic valve regurgitation is not  visualized.   5. The inferior vena cava is normal in size with greater than 50%  respiratory variability, suggesting right atrial pressure of 3 mmHg.   Patient Profile     36 y.o. 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   Assessment & Plan    Day 2 with anterior MI complicated by VF arrest around midnight Tuesday evening with CPR initiated by boyfriend fibrillated by EMS.  Status post successful PCI to totally occluded LAD with DES stent.  No recurrent angina.  ECG shows inferior and anterolateral T wave inversion.  No recurrent anginal symptoms.  Tolerating DAPT with aspirin/Brilinta. Ischemic cardiomyopathy.  EF on echo is 35 to 40%.  Patient was started on low-dose metoprolol succinate 12.5 mg and low-dose losartan at 12.5 mg daily.  Blood pressure today approximately 105/70.  Apparently,  patient never received losartan 12.5 mg yesterday.  As result we will initiate today and if she tolerates this from a blood pressure standpoint transition to 12.5 mg twice a day tomorrow.  Ultimately initiate aldosterone blockade and possible SGLT2 inhibition. Hopefully myocardium is still stunned and there may be further improvement in LV function Hyperlipidemia: Patient reports remote total cholesterol in excess of 400.   Patient was on atorvastatin 40  mg prior to hospitalization.  LDL on presentation was 147.  She was changed to rosuvastatin 40 mg and Zetia 10 mg was added.  We will need to achieve target LDL less than 70 and preferably in the 50s.  If unable to do so, initiate PCSK9 inhibition.  LP(a) mildly increased at 82.6. Possible nondisplaced rib fracture.  Patient continues to experience some right rib pain.  Chest x-ray performed yesterday suggests possible nondisplaced stable.  Will initiate lidocaine patch. History of tobacco use and remote cocaine.  Patient denies recent cocaine use [facility]  With low blood pressure, will change losartan from 12.5 mg daily and slightly titrate to 12.5 mg twice a day.  We will transfer out of ICU to cardiac telemetry.  Cardiac rehab.  Anticipate discharge in 48 hours  Lennette Biharihomas A. Adaijah Endres, MD, [facility] HospitalFACC 03/22/2021 8:41 AM

## 2021-03-22 NOTE — Care Management (Signed)
?  Transition of Care (TOC) Screening Note ? ? ?Patient Details  ?Name: Maria Ellis ?Date of Birth: 1985-12-24 ? ? ?Transition of Care (TOC) CM/SW Contact:    ?Graves-Bigelow, Lamar Laundry, RN ?Phone Number: ?03/22/2021, 2:28 PM ? ? ? ?Transition of Care Department [facility]) has reviewed patient and no TOC needs have been identified at this time. We will continue to monitor patient advancement through interdisciplinary progression rounds. If new patient transition needs arise, please place a TOC consult. ?  ?

## 2021-03-22 NOTE — Progress Notes (Signed)
Heart Failure Stewardship Pharmacist Progress Note ? ? ?PCP: Patient, No Pcp Per (Inactive) ?PCP-Cardiologist: None  ? ? ?HPI:  ?36 yo F with PMH of VP shunt. Presented to [facility] on 3/1 following VF arrest. Transferred to [facility] for cardiac cath. S/p PCI to LAD. EF was estimated at that time to be 40%. Formal ECHO done on 3/2 and LVEF was 35-40% with G1DD and normal RV. CXR on admission clear.  ? ?Current HF Medications: ?Beta Blocker: metoprolol XL 12.5 mg daily ?ACE/ARB/ARNI: losartan 12.5 mg daily ? ?Prior to admission HF Medications: ?None ? ?Pertinent Lab Values: ?Serum creatinine 0.68, BUN 11, Potassium 4.2, Sodium 139, Magnesium 2.2, A1c 5.1  ? ?Vital Signs: ?Weight: 193 lbs (admission weight: 193 lbs) ?Blood pressure: 100/70s  ?Heart rate: 70s  ?I/O: not well documented ? ?Medication Assistance / Insurance Benefits Check: ?Does the patient have prescription insurance?  Yes ?Type of insurance plan: [place] Medicaid ? ?Outpatient Pharmacy:  ?Prior to admission outpatient [facility] ?Is the patient willing to use [facility] at discharge? Pending ?Is the patient willing to transition their outpatient pharmacy to utilize a [facility] outpatient pharmacy?   Pending ?  ? ?Assessment: ?1. Acute systolic CHF (EF 123456), due to ICM - s/p PCI to LAD. NYHA class II symptoms. ?- No IV diuretics ?- Continue metoprolol XL 12.5 mg daily ?- Continue losartan 12.5 mg daily ?- Consider adding spironolactone if BP allows ?- Consider adding SGLT2i  ?  ?Plan: ?1) Medication changes recommended at this time: ?- Add Farxiga 10 mg daily ? ?2) Patient assistance: ?- Farxiga copay $0 ?- Jardiance copay $0 ?- Entresto copay $0  ? ?3)  Education  ?- To be completed prior to discharge ? ?Kerby Nora, PharmD, BCPS ?Heart Failure Stewardship Pharmacist ?Phone [phone] ? ? ?

## 2021-03-23 LAB — COMPREHENSIVE METABOLIC PANEL
ALT: 35 U/L (ref 0–44)
AST: 27 U/L (ref 15–41)
Albumin: 4 g/dL (ref 3.5–5.0)
Alkaline Phosphatase: 83 U/L (ref 38–126)
Anion gap: 12 (ref 5–15)
BUN: 11 mg/dL (ref 6–20)
CO2: 22 mmol/L (ref 22–32)
Calcium: 9.4 mg/dL (ref 8.9–10.3)
Chloride: 104 mmol/L (ref 98–111)
Creatinine, Ser: 0.69 mg/dL (ref 0.44–1.00)
GFR, Estimated: >60 mL/min (ref >60)
Glucose, Bld: 102 mg/dL — ABNORMAL HIGH (ref 70–99)
Potassium: 3.6 mmol/L (ref 3.5–5.1)
Sodium: 138 mmol/L (ref 135–145)
Total Bilirubin: 0.4 mg/dL (ref 0.3–1.2)
Total Protein: 6.9 g/dL (ref 6.5–8.1)

## 2021-03-23 MED ORDER — ONDANSETRON HCL 4 MG PO TABS
4.0000 mg | ORAL_TABLET | Freq: Four times a day (QID) | ORAL | Status: DC | PRN
  Administered 2021-03-23: 4 mg via ORAL
  Filled 2021-03-23: qty 1

## 2021-03-23 MED ORDER — ONDANSETRON HCL 4 MG PO TABS: 4.0000 mg | ORAL_TABLET | ORAL | Status: DC | PRN

## 2021-03-23 NOTE — Progress Notes (Signed)
CARDIAC REHAB PHASE I  ? ?PRE:  Rate/Rhythm: SR/96 ? ?BP:  Sitting: 101/78 ?   ?  SaO2: 98 ? ?MODE:  Ambulation: 430 ft HR 110/ SaO2 = 98% ? ?POST:  Rate/Rhythm: SR/99 ? ?BP:  Sitting: 95/71 ?   ?  SaO2: 99% ? ?Pt received in chair, agrees to ambulate. Pt ambulated with slow, steady gait. Pt is guards her chest due to right rib pain. Pt took 1 break mid walk due to feeling SOB (SaO2 = 96%). Pt denied dizziness and had not other complaints. Back to chair with brake on and call bell and tray table in reach. Pt voiced she would like her pain medication after the walk, primary RN notified of this request.  ?Reviewed RPE scale with patient and mother to use as guideline when walking at home.  ? ?Lesly Rubenstein, MS, ACSM EP-C, CCRP ?03/23/2021  ?09:55-10:37 ? ?

## 2021-03-23 NOTE — Progress Notes (Signed)
Progress Note  Patient Name: Maria Ellis Date of Encounter: 03/23/2021  [facility] Cardiologist:  Herbie Baltimore   Subjective   Patient is a 35 year old female who is been seen at [facility] emergency room several times over the past couple months for chest pain evaluation.  She ruled out for myocardial infarction.  She tested positive for cocaine on UDS.  On March where she was found unresponsive on her couch.  EMS was called and found her to be in ventricular fibrillation.  She received CPR and had 2 defibrillations. On the way to [facility] she regained consciousness and pulled out her [facility] airway.  She was treated transferred to [facility].  She was taken urgently to the Cath Lab and was found to have 100% occluded LAD.  She had stenting of her LAD.  She had elevated EDP at that time.  She is feeling better.  Sore on R side of chest from CPR  Will ambulate with rehab. See how she does today .   Anticipate possible DC tomorrow if she does well     Inpatient Medications    Scheduled Meds:  aspirin  81 mg Oral Daily   Chlorhexidine Gluconate Cloth  6 each Topical Daily   dapagliflozin propanediol  10 mg Oral Daily   ezetimibe  10 mg Oral Daily   heparin  5,000 Units Subcutaneous Q8H   lidocaine  1 patch Transdermal Q24H   losartan  12.5 mg Oral Daily   metoprolol succinate  12.5 mg Oral Daily   rosuvastatin  40 mg Oral Daily   sodium chloride flush  3 mL Intravenous Q12H   ticagrelor  90 mg Oral BID   Continuous Infusions:  sodium chloride     PRN Meds: sodium chloride, morphine injection, ondansetron (ZOFRAN) IV, oxyCODONE, sodium chloride flush   Vital Signs    Vitals:   03/22/21 2154 03/22/21 2240 03/23/21 0423 03/23/21 0754  BP: 95/68 103/64 (!) 101/58 106/74  Pulse: 76 70 75 83  Resp:  18 18 16   Temp:  98.7 F (37.1 C) 98.2 F (36.8 C) 98.5 F (36.9 C)  TempSrc:  Oral Oral Oral  SpO2:  99% (!) 85% 100%  Weight:      Height:         Intake/Output Summary (Last 24 hours) at 03/23/2021 0908 Last data filed at 03/22/2021 2040 Gross per 24 hour  Intake 120 ml  Output --  Net 120 ml   Last 3 Weights 03/20/2021 03/20/2021 06/22/2018  Weight (lbs) 193 lb 2 oz 177 lb 14.6 oz 165 lb  Weight (kg) 87.6 kg 80.7 kg 74.844 kg      Telemetry    Sinus rhythm at 89  - Personally Reviewed  ECG     - Personally Reviewed  Physical Exam   GEN: No acute distress.   Neck: No JVD Cardiac: RRR, no murmurs, rubs, or gallops.   Very tender on right sided ribs Respiratory: Clear to auscultation bilaterally. GI: Soft, nontender, non-distended  MS: No edema; No deformity. Neuro:  Nonfocal  Psych: Normal affect   Labs    High Sensitivity Troponin:   Recent Labs  Lab 03/20/21 0526 03/20/21 1143 03/20/21 1847  TROPONINIHS 9,431* >24,000* >24,000*     Chemistry Recent Labs  Lab 03/20/21 0526 03/21/21 0125 03/22/21 0224 03/23/21 0332  NA 134* 139 139 138  K 3.3* 3.3* 4.2 3.6  CL 105 107 108 104  CO2 19* 23 21* 22  GLUCOSE 170*  112* 97 102*  BUN 5* 8 11 11   CREATININE 0.78 0.74 0.68 0.69  CALCIUM 7.6* 8.4* 9.0 9.4  MG 1.8 2.2  --   --   PROT 5.7* 6.3* 6.4* 6.9  ALBUMIN 3.5 3.7 3.7 4.0  AST 106* 153* 48* 27  ALT 40 51* 41 35  ALKPHOS 58 68 69 83  BILITOT 0.8 0.9 0.3 0.4  GFRNONAA >60 >60 >60 >60  ANIONGAP 10 9 10 12     Lipids  Recent Labs  Lab 03/20/21 0526  CHOL 199  TRIG 33  HDL 45  LDLCALC 147*  CHOLHDL 4.4    Hematology Recent Labs  Lab 03/20/21 0526 03/21/21 0125 03/22/21 0224  WBC 22.4* 19.6* 13.1*  RBC 4.56 4.63 4.45  HGB 13.5 13.2 12.8  HCT 37.4 39.2 39.0  MCV 82.0 84.7 87.6  MCH 29.6 28.5 28.8  MCHC 36.1* 33.7 32.8  RDW 12.4 12.6 12.7  PLT 276 284 270   Thyroid No results for input(s): TSH, FREET4 in the last 168 hours.  BNPNo results for input(s): BNP, PROBNP in the last 168 hours.  DDimer No results for input(s): DDIMER in the last 168 hours.   Radiology    DG Chest 2  View  Result Date: 03/21/2021 CLINICAL DATA:  Right rib pain after CPR. EXAM: CHEST - 2 VIEW COMPARISON:  Earlier today FINDINGS: The heart size and mediastinal contours are within normal limits. Both lungs are clear. Right sided ventriculoperitoneal shunt catheter is again noted. There is a focal area of cortical discontinuity along the mid body of sternum which may reflect nondisplaced fracture status post CPR. IMPRESSION: 1. No acute cardiopulmonary abnormalities. 2. Cortical discontinuity along the mid body of the sternum may reflect nondisplaced fracture status post CPR. Correlate for any focal tenderness along the sternum. Electronically Signed   By: Kerby Moors M.D.   On: 03/21/2021 12:18   ECHOCARDIOGRAM COMPLETE  Result Date: 03/21/2021    ECHOCARDIOGRAM REPORT   Patient Name:   Maria Ellis Date of Exam: 03/21/2021 Medical Rec #:  [id number]     Height:       63.0 in Accession #:    [id number]    Weight:       193.1 lb Date of Birth:  Jul 12, 1985     BSA:          1.905 m Patient Age:    77 years      BP:           99/68 mmHg Patient Gender: F             HR:           82 bpm. Exam Location:  Inpatient Procedure: 2D Echo, Cardiac Doppler, Color Doppler and Intracardiac            Opacification Agent Indications:    CAD MI  History:        Patient has no prior history of Echocardiogram examinations.                 3/1/20223 cath with stent placement.  Sonographer:    Luisa Hart RDCS Referring Phys: [place]  1. Left ventricular ejection fraction, by estimation, is 35 to 40%. Left ventricular ejection fraction by 2D MOD biplane is 37.6 %. The left ventricle has moderately decreased function. The left ventricle demonstrates regional wall motion abnormalities (see scoring diagram/findings for description). Left ventricular diastolic parameters are consistent with Grade I diastolic dysfunction (impaired  relaxation). There is moderate hypokinesis of the left ventricular, entire  septal wall, anteroseptal wall and apical segment. Findings suggestive of LAD territory ischemia/infarct.  2. Right ventricular systolic function is normal. The right ventricular size is normal. Tricuspid regurgitation signal is inadequate for assessing PA pressure.  3. The mitral valve is grossly normal. Trivial mitral valve regurgitation.  4. The aortic valve is tricuspid. Aortic valve regurgitation is not visualized.  5. The inferior vena cava is normal in size with greater than 50% respiratory variability, suggesting right atrial pressure of 3 mmHg. Comparison(s): No prior Echocardiogram. FINDINGS  Left Ventricle: Left ventricular ejection fraction, by estimation, is 35 to 40%. Left ventricular ejection fraction by 2D MOD biplane is 37.6 %. The left ventricle has moderately decreased function. The left ventricle demonstrates regional wall motion abnormalities. Moderate hypokinesis of the left ventricular, entire septal wall, anteroseptal wall and apical segment. Definity contrast agent was given IV to delineate the left ventricular endocardial borders. The left ventricular internal cavity size was normal in size. There is no left ventricular hypertrophy. Left ventricular diastolic parameters are consistent with Grade I diastolic dysfunction (impaired relaxation). Indeterminate filling pressures. Right Ventricle: The right ventricular size is normal. No increase in right ventricular wall thickness. Right ventricular systolic function is normal. Tricuspid regurgitation signal is inadequate for assessing PA pressure. Left Atrium: Left atrial size was normal in size. Right Atrium: Right atrial size was normal in size. Pericardium: There is no evidence of pericardial effusion. Mitral Valve: The mitral valve is grossly normal. Trivial mitral valve regurgitation. Tricuspid Valve: The tricuspid valve is grossly normal. Tricuspid valve regurgitation is not demonstrated. Aortic Valve: The aortic valve is tricuspid. Aortic  valve regurgitation is not visualized. Aortic valve mean gradient measures 2.0 mmHg. Aortic valve peak gradient measures 3.5 mmHg. Aortic valve area, by VTI measures 2.14 cm. Pulmonic Valve: The pulmonic valve was grossly normal. Pulmonic valve regurgitation is not visualized. Aorta: The aortic root and ascending aorta are structurally normal, with no evidence of dilitation. Venous: The inferior vena cava is normal in size with greater than 50% respiratory variability, suggesting right atrial pressure of 3 mmHg. IAS/Shunts: No atrial level shunt detected by color flow Doppler.  LEFT VENTRICLE PLAX 2D                        Biplane EF (MOD) LVIDd:         5.40 cm         LV Biplane EF:   Left LVIDs:         4.40 cm                          ventricular LV PW:         0.80 cm                          ejection LV IVS:        0.70 cm                          fraction by LVOT diam:     1.80 cm                          2D MOD LV SV:         38  biplane is LV SV Index:   20                               37.6 %. LVOT Area:     2.54 cm                                Diastology                                LV e' medial:    7.18 cm/s LV Volumes (MOD)               LV E/e' medial:  9.6 LV vol d, MOD    60.8 ml       LV e' lateral:   4.80 cm/s A2C:                           LV E/e' lateral: 14.3 LV vol d, MOD    87.2 ml A4C: LV vol s, MOD    44.8 ml A2C: LV vol s, MOD    46.4 ml A4C: LV SV MOD A2C:   16.0 ml LV SV MOD A4C:   87.2 ml LV SV MOD BP:    27.5 ml RIGHT VENTRICLE RV Basal diam:  2.80 cm RV Mid diam:    1.90 cm RV S prime:     12.60 cm/s LEFT ATRIUM             Index        RIGHT ATRIUM           Index LA Vol (A2C):   28.5 ml 14.96 ml/m  RA Area:     11.70 cm LA Vol (A4C):   29.5 ml 15.48 ml/m  RA Volume:   28.40 ml  14.91 ml/m LA Biplane Vol: 30.3 ml 15.90 ml/m  AORTIC VALVE                    PULMONIC VALVE AV Area (Vmax):    2.16 cm     PV Vmax:       0.71 m/s AV Area (Vmean):    2.06 cm     PV Vmean:      57.500 cm/s AV Area (VTI):     2.14 cm     PV VTI:        0.157 m AV Vmax:           93.00 cm/s   PV Peak grad:  2.0 mmHg AV Vmean:          71.900 cm/s  PV Mean grad:  1.0 mmHg AV VTI:            0.177 m AV Peak Grad:      3.5 mmHg AV Mean Grad:      2.0 mmHg LVOT Vmax:         79.00 cm/s LVOT Vmean:        58.200 cm/s LVOT VTI:          0.149 m LVOT/AV VTI ratio: 0.84  AORTA Ao Asc diam: 3.10 cm MITRAL VALVE MV Area (PHT): 4.99 cm    SHUNTS MV Decel Time: 152 msec    Systemic VTI:  0.15 m MV E velocity: 68.60 cm/s  Systemic Diam:  1.80 cm MV A velocity: 51.80 cm/s MV E/A ratio:  1.32 Lyman Bishop MD Electronically signed by Lyman Bishop MD Signature Date/Time: 03/21/2021/12:58:51 PM    Final     Cardiac Studies     Patient Profile     36 y.o. female  with VF arrest,  found to have acute occlusion of her LAD   Assessment & Plan      VF arrest:  s/p defib, she transferred from [facility] to [facility] for urgent heart catheterization.  She was found to have an occluded left anterior descending artery which was stented. She is done well since that time.  She has not had any further anginal-like chest pains.  Her rhythm has been stable.  She has persistent right-sided rib pain most likely due to rib fractures from CPR.  2.  Hyperlipidemia: She was previously on atorvastatin.  She is now on rosuvastatin 40 mg a day as well as Zetia 10 mg a day   3.  Coronary artery disease: She presented with an acute occlusion of her mid LAD.  She is now status post stenting of her mid LAD.  Acute combined  systolic and diastolic congestive heart failure: Echocardiogram shows ejection fraction of 35 to 40%.  She has grade 1 diastolic dysfunction.  She is on low-dose Toprol-XL, losartan, Farxiga. Currently her blood pressure is fairly low which limits our ability to titrate other medications and.  Would consider adding spironolactone if her blood pressure allows as an  outpatient.  I will see how she does today and will anticipate discharge tomorrow if she is doing well.  For questions or updates, please contact [place] Please consult [email or web address] for contact info under        Signed, Mertie Moores, MD  03/23/2021, 9:08 AM

## 2021-03-24 LAB — BASIC METABOLIC PANEL
Anion gap: 10 (ref 5–15)
BUN: 12 mg/dL (ref 6–20)
CO2: 22 mmol/L (ref 22–32)
Calcium: 9.2 mg/dL (ref 8.9–10.3)
Chloride: 103 mmol/L (ref 98–111)
Creatinine, Ser: 0.73 mg/dL (ref 0.44–1.00)
GFR, Estimated: >60 mL/min (ref >60)
Glucose, Bld: 103 mg/dL — ABNORMAL HIGH (ref 70–99)
Potassium: 3.7 mmol/L (ref 3.5–5.1)
Sodium: 135 mmol/L (ref 135–145)

## 2021-03-24 MED ORDER — ROSUVASTATIN CALCIUM 40 MG PO TABS
40.0000 mg | ORAL_TABLET | Freq: Every day | ORAL | 3 refills | Status: DC
Start: 2021-03-25 — End: 2022-03-25

## 2021-03-24 MED ORDER — METOPROLOL SUCCINATE ER 25 MG PO TB24: 12.5000 mg | ORAL_TABLET | Freq: Every day | ORAL | 2 refills | Status: DC

## 2021-03-24 MED ORDER — NITROGLYCERIN 0.4 MG SL SUBL
0.4000 mg | SUBLINGUAL_TABLET | SUBLINGUAL | 3 refills | Status: DC | PRN
Start: 2021-03-24 — End: 2022-08-18

## 2021-03-24 MED ORDER — ASPIRIN 81 MG PO CHEW
81.0000 mg | CHEWABLE_TABLET | Freq: Every day | ORAL | Status: AC
Start: 2021-03-25 — End: ?

## 2021-03-24 MED ORDER — TICAGRELOR 90 MG PO TABS
90.0000 mg | ORAL_TABLET | Freq: Two times a day (BID) | ORAL | 11 refills | Status: DC
Start: 2021-03-24 — End: 2022-02-20

## 2021-03-24 MED ORDER — NITROGLYCERIN 0.4 MG SL SUBL: 0.4000 mg | SUBLINGUAL_TABLET | SUBLINGUAL | Status: DC | PRN

## 2021-03-24 MED ORDER — DAPAGLIFLOZIN PROPANEDIOL 10 MG PO TABS: 10.0000 mg | ORAL_TABLET | Freq: Every day | ORAL | 11 refills | Status: DC

## 2021-03-24 MED ORDER — EZETIMIBE 10 MG PO TABS: 10.0000 mg | ORAL_TABLET | Freq: Every day | ORAL | 3 refills | Status: DC

## 2021-03-24 MED ORDER — LOSARTAN POTASSIUM 25 MG PO TABS
12.5000 mg | ORAL_TABLET | Freq: Every day | ORAL | 2 refills | Status: DC
Start: 2021-03-25 — End: 2021-04-03

## 2021-03-24 NOTE — Progress Notes (Signed)
? ?Progress Note ? ?Patient Name: Maria Ellis ?Date of Encounter: 03/24/2021 ? ?[facility] Cardiologist:  Herbie Baltimore  ? ?Subjective  ? ?Patient is a 36 year old female who is been seen at [facility] emergency room several times over the past couple months for chest pain evaluation. ? ?She ruled out for myocardial infarction.  She tested positive for cocaine on UDS. ? ?On March where she was found unresponsive on her couch.  EMS was called and found her to be in ventricular fibrillation.  She received CPR and had 2 defibrillations. ?On the way to [facility] she regained consciousness and pulled out her [facility] airway. ? ?She was treated transferred to [facility].  She was taken urgently to the Cath Lab and was found to have 100% occluded LAD.  She had stenting of her LAD.  She had elevated EDP at that time. ? ?She is feeling better.  ?Sore on R side of chest from CPR  ?Will ambulate with rehab. ?See how she does today .   She is done very well.  We will discharge her to home today.  Normal sinus rhythm ? ? ? ?Inpatient Medications  ?  ?Scheduled Meds: ? aspirin  81 mg Oral Daily  ? dapagliflozin propanediol  10 mg Oral Daily  ? ezetimibe  10 mg Oral Daily  ? heparin  5,000 Units Subcutaneous Q8H  ? lidocaine  1 patch Transdermal Q24H  ? losartan  12.5 mg Oral Daily  ? metoprolol succinate  12.5 mg Oral Daily  ? rosuvastatin  40 mg Oral Daily  ? sodium chloride flush  3 mL Intravenous Q12H  ? ticagrelor  90 mg Oral BID  ? ?Continuous Infusions: ? sodium chloride    ? ?PRN Meds: ?sodium chloride, morphine injection, ondansetron (ZOFRAN) IV, ondansetron, oxyCODONE, sodium chloride flush  ? ?Vital Signs  ?  ?Vitals:  ? 03/23/21 4196 03/23/21 2029 03/24/21 2229 03/24/21 7989  ?BP: 101/73 100/61 111/67 112/70  ?Pulse:  74  88  ?Resp:  18 18 18   ?Temp:  98.6 ?F (37 ?C) 97.9 ?F (36.6 ?C) 98 ?F (36.7 ?C)  ?TempSrc:  Oral Oral Oral  ?SpO2:   98% 98%  ?Weight:      ?Height:      ? ? ?Intake/Output Summary (Last  24 hours) at 03/24/2021 0947 ?Last data filed at 03/24/2021 0022 ?Gross per 24 hour  ?Intake 840 ml  ?Output --  ?Net 840 ml  ? ? ?Last 3 Weights 03/20/2021 03/20/2021 06/22/2018  ?Weight (lbs) 193 lb 2 oz 177 lb 14.6 oz 165 lb  ?Weight (kg) 87.6 kg 80.7 kg 74.844 kg  ?   ? ?Telemetry  ?  ?Sinus rhythm at 89  - Personally Reviewed ? ?ECG  ?  ? - Personally Reviewed ? ?Physical Exam  ? ?Physical Exam: ?Blood pressure 112/70, pulse 88, temperature 98 ?F (36.7 ?C), temperature source Oral, resp. rate 18, height 5\' 3"  (1.6 m), weight 87.6 kg, SpO2 98 %. ? ?GEN:  Well nourished, well developed in no acute distress ?HEENT: Normal ?NECK: No JVD; No carotid bruits ?LYMPHATICS: No lymphadenopathy ?CARDIAC: RRR , no murmurs, rubs, gallops.  Tender on R side of chest  ?RESPIRATORY:  Clear to auscultation without rales, wheezing or rhonchi  ?ABDOMEN: Soft, non-tender, non-distended ?MUSCULOSKELETAL:  No edema; No deformity  ?SKIN: Warm and dry ?NEUROLOGIC:  Alert and oriented x 3 ? ? ?Labs  ?  ?High Sensitivity Troponin:   ?Recent Labs  ?Lab 03/20/21 ? 03/20/21 ?1143  03/20/21 ?1847  ?TROPONINIHS 9,431* >24,000* >24,000*  ? ?   ?Chemistry ?Recent Labs  ?Lab 03/20/21 ?8832 03/21/21 ?0125 03/22/21 ?5498 03/23/21 ?2641 03/24/21 ?0117  ?NA 134* 139 139 138 135  ?K 3.3* 3.3* 4.2 3.6 3.7  ?CL 105 107 108 104 103  ?CO2 19* 23 21* 22 22  ?GLUCOSE 170* 112* 97 102* 103*  ?BUN 5* 8 11 11 12   ?CREATININE 0.78 0.74 0.68 0.69 0.73  ?CALCIUM 7.6* 8.4* 9.0 9.4 9.2  ?MG 1.8 2.2  --   --   --   ?PROT 5.7* 6.3* 6.4* 6.9  --   ?ALBUMIN 3.5 3.7 3.7 4.0  --   ?AST 106* 153* 48* 27  --   ?ALT 40 51* 41 35  --   ?ALKPHOS 58 68 69 83  --   ?BILITOT 0.8 0.9 0.3 0.4  --   ?GFRNONAA >60 >60 >60 >60 >60  ?ANIONGAP 10 9 10 12 10   ? ?  ?Lipids  ?Recent Labs  ?Lab 03/20/21 ?  ?CHOL 199  ?TRIG 33  ?HDL 45  ?LDLCALC 147*  ?CHOLHDL 4.4  ? ?  ?Hematology ?Recent Labs  ?Lab 03/20/21 ?5830 03/21/21 ?0125 03/22/21 ?05/21/21  ?WBC 22.4* 19.6* 13.1*  ?RBC 4.56 4.63 4.45   ?HGB 13.5 13.2 12.8  ?HCT 37.4 39.2 39.0  ?MCV 82.0 84.7 87.6  ?MCH 29.6 28.5 28.8  ?MCHC 36.1* 33.7 32.8  ?RDW 12.4 12.6 12.7  ?PLT 276 284 270  ? ? ?Thyroid No results for input(s): TSH, FREET4 in the last 168 hours.  ?BNPNo results for input(s): BNP, PROBNP in the last 168 hours.  ?DDimer No results for input(s): DDIMER in the last 168 hours.  ? ?Radiology  ?  ?No results found. ? ?Cardiac Studies  ?  ? ?Patient Profile  ?   ?36 y.o. female  with VF arrest,  found to have acute occlusion of her LAD  ? ?Assessment & Plan  ?  ?  VF arrest:  s/p defib, she transferred from [facility] to [facility] for urgent heart catheterization.  She was found to have an occluded left anterior descending artery which was stented. ?She is done very well.  She is ambulated in the halls without any issues.  She is not having any further episodes of angina. ? ?2.  Hyperlipidemia: She was previously on atorvastatin.  Continue rosuvastatin 40 mg a day and Zetia 10 mg a day.  If her lipids are not adequately controlled she will need to consider a PCSK9 inhibitor. ? ? ?3.  Coronary artery disease: She presented with an acute occlusion of her mid LAD.  Status post stenting of her mid LAD.  Continue aspirin 81 mg a day.  Continue Brilinta 90 mg a day ? ? ?4.  Acute combined  systolic and diastolic congestive heart failure: ?Echocardiogram shows ejection fraction of 35 to 40%.  She has grade 1 diastolic dysfunction.  She is on low-dose Toprol-XL, losartan, Farxiga. ?Currently her blood pressure is fairly low which limits our ability to titrate other medications and.   ? ?She is doing well.  Cont current meds. ? ? ?For questions or updates, please contact [facility] ?Please consult [email or web address] for contact info under  ? ?  ?   ?Signed, ?[facility], MD  ?03/24/2021, 9:47 AM   ? ?

## 2021-03-24 NOTE — Plan of Care (Signed)

## 2021-03-24 NOTE — Discharge Summary (Addendum)
Discharge Summary    Patient ID: Maria Ellis MRN: [id number]; DOB: 1985-05-27  Admit date: 03/20/2021 Discharge date: 03/24/2021  PCP:  Patient, No Pcp Per (Inactive)   [facility] Providers Cardiologist:  seen by Dr. Ellyn Hack during this admission - Patient requested to set up with Dr. Bettina Gavia as she lives in [place]    Discharge Diagnoses    Principal Problem:   Ventricular fibrillation [facility]) Active Problems:   Anterior ST segment elevation (HCC)   Right-sided chest pain   Elevated troponin   Cardiac arrest with ventricular fibrillation ([place])   Tobacco abuse   ST elevation myocardial infarction (STEMI) [facility])    Diagnostic Studies/Procedures    Cath 03/20/2021   CULPRIT LESION: Prox LAD to Mid LAD lesion is 100% stenosed.   A drug-eluting stent was successfully placed using a SYNERGY XD 2.50X28.  Postdilated to 3.1 mm.   Post intervention, there is a 0% residual stenosis.   --------------------------------------   Otherwise angiographically normal RCA, RI and LCx   --------------------------------------   There is moderate left ventricular systolic dysfunction.  The left ventricular ejection fraction is 35-45% by visual estimate.   LV end diastolic pressure is severely elevated.   There is no aortic valve stenosis.   SUMMARY Cardiac arrest with ventricular fibrillation and ROSC secondary to Anterior STEMI Severe single-vessel CAD with 100% thrombotic occlusion of the LAD just after D1 Successful DES PCI of LAD restoring TIMI-3 flow using Synergy XD DES 2.5 x 28 mm stent postdilated to 3.1 mm. Acute combined systolic and diastolic heart failure: EF estimated roughly 40% by LV gram with EDP of 28 to 30 mmHg.     RECOMMENDATIONS We will continue lidocaine and Aggrastat drip for 2 to 3 hours post PCI to allow for further stabilization Check 2D echocardiogram on March 2 to allow time for stabilization Given 4 mg IV Lasix post-cath-reassess Start low-dose  beta-blocker-carvedilol 3.125 mg twice daily.  We will need to titrate as blood pressure tolerates and consider addition of ARB/ARNI and spironolactone. Increase statin dose to 80 mg daily Smoking station counseling   Echo 03/21/2021  1. Left ventricular ejection fraction, by estimation, is 35 to 40%. Left  ventricular ejection fraction by 2D MOD biplane is 37.6 %. The left  ventricle has moderately decreased function. The left ventricle  demonstrates regional wall motion abnormalities  (see scoring diagram/findings for description). Left ventricular diastolic  parameters are consistent with Grade I diastolic dysfunction (impaired  relaxation). There is moderate hypokinesis of the left ventricular, entire  septal wall, anteroseptal wall  and apical segment. Findings suggestive of LAD territory ischemia/infarct.   2. Right ventricular systolic function is normal. The right ventricular  size is normal. Tricuspid regurgitation signal is inadequate for assessing  PA pressure.   3. The mitral valve is grossly normal. Trivial mitral valve  regurgitation.   4. The aortic valve is tricuspid. Aortic valve regurgitation is not  visualized.   5. The inferior vena cava is normal in size with greater than 50%  respiratory variability, suggesting right atrial pressure of 3 mmHg.   Comparison(s): No prior Echocardiogram.  _____________   History of Present Illness     Maria Ellis is a 36 y.o. 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  [name] has been seen several times  at the [facility] ER in the last month or 2 for chest pain evaluation.  Most recently was 2 weeks ago where she ruled out for MI with negative troponins and normal EKG.  She apparently been doing relatively well her normal state of health  otherwise.  She did test positive for cocaine on urine tox screen at that time, but vehemently denies using cocaine.  She does use marijuana.  On the morning of March 1, her significant other came home found her unresponsive on the couch, EMS called.  Upon arrival EMS found her to be in ventricular fibrillation.  She was shocked twice along with CPR.  She apparently pulled out her [employer] Pace airway in transit to [facility].  She had ROSC and was initially quite agitated when pulling the [facility] airway.  She did receive 2 Versed in the ER and steadily stabilized.  She is now wide-awake.  She continued to have right-sided chest pain and EKG now showed new EKG changes with subtle 2 to 3 mm ST elevations in V2 and V3.  No further arrhythmia.  She is on a lidocaine drip.  Was given 4000 units of IV heparin.  24 mg aspirin.  She has been on IV heparin drip in transit.  Initially started on nitroglycerin infusion, but had low blood pressures leading to that nitroglycerin being discontinued and given normal saline bolus of 1 L.  Pressures are now stabilized roughly 107 over 78 mmHg.   Upon arrival to [facility] she continues to have complaints of right-sided chest pain.  EKG reviewed this still shows the subtle changes in the anteroseptal leads.  She denies any nausea at present.  Has a headache.  No dyspnea.   Troponin I was 10.49 at [facility].   There was delay the diagnosis due to subtle changes in the EKG post ROSC.  After comparison to the previous EKG showed that there indeed was a difference albeit very subtle, we chose to activate code STEMI for postarrest.      Hospital Course     Consultants: N/A   Patient was transferred from [facility] to [facility].  Code STEMI was activated, patient was brought directly to the cardiac catheterization lab where it was found that she had 100% occluded LAD after a small D1, this was treated with drug-eluting stent.  EF was roughly 35  to 40% with severe anterior hypokinesis.  LVEDP elevated at the 28-30 mmHg.  Postprocedure, Aggrastat was continued for additional 3 hours.  She was given a dose of IV Lasix.  Patient was placed on aspirin and Brilinta.  Lipoprotein a was mildly increased at 82.6.  She was also placed on Crestor, and Zetia and a low-dose Toprol-XL.  She continues to have soreness on the right side of her chest due to CPR.  Losartan and [place]ade was also added due to low ejection fraction.  If blood pressure is stable on follow-up, may consider adding spironolactone.  Echocardiogram obtained on 03/21/2021 showed EF 35 to 40%, grade 1 DD, moderate hypokinesis of the LV, entire septal wall, anteroseptal wall and apical segment, findings suggestive of LAD territory ischemia/infarct, trivial MR.  Patient was seen in the morning of 03/24/2021 at which time she was doing well and ambulating in the hallway without any issue.  She denies any further anginal symptom.  She will need aggressive lipid control.  She was started on Crestor and Zetia during hospitalization, however if LDL is not adequately controlled, she will  need to consider PCSK9 inhibitor as outpatient.  I have sent staff message to our [place] office to arrange 1 week TOC visit. Emphasis has been placed on compliance with DAPT. She was given a Brilinta coupon card. She is aware the ASA is lifelong and Brilinta for minimum of 12 month. Patient requested to be set up with Dr. Bettina Gavia who sees her aunt. She lives in [place]. She will need FLP and LFT in 6-8 weeks.        Did the patient have an acute coronary syndrome (MI, NSTEMI, STEMI, etc) this admission?:  Yes                               AHA/ACC Clinical Performance & Quality Measures: Aspirin prescribed? - Yes ADP Receptor Inhibitor (Plavix/Clopidogrel, Brilinta/Ticagrelor or Effient/Prasugrel) prescribed (includes medically managed patients)? - Yes Beta Blocker prescribed? - Yes High Intensity Statin (Lipitor  40-80mg  or Crestor 20-40mg ) prescribed? - Yes EF assessed during THIS hospitalization? - Yes For EF <40%, was ACEI/ARB prescribed? - Yes For EF <40%, Aldosterone Antagonist (Spironolactone or Eplerenone) prescribed? - No - Reason:  no BP room, will reassess on follow up Cardiac Rehab Phase II ordered (including medically managed patients)? - Yes       The patient will be scheduled for a TOC follow up appointment in 7 days.  A message has been sent to the [facility] and Scheduling Pool at the office where the patient should be seen for follow up.  _____________  Discharge Vitals Blood pressure 112/70, pulse 88, temperature 98 F (36.7 C), temperature source Oral, resp. rate 18, height 5\' 3"  (1.6 m), weight 87.6 kg, SpO2 98 %.  Filed Weights   03/20/21 0517 03/20/21 0626  Weight: 80.7 kg 87.6 kg    Labs & Radiologic Studies    CBC Recent Labs    03/22/21 0224  WBC 13.1*  NEUTROABS 6.4  HGB 12.8  HCT 39.0  MCV 87.6  PLT [id number]   Basic Metabolic Panel Recent Labs    03/23/21 0332 03/24/21 0117  NA 138 135  K 3.6 3.7  CL 104 103  CO2 22 22  GLUCOSE 102* 103*  BUN 11 12  CREATININE 0.69 0.73  CALCIUM 9.4 9.2   Liver Function Tests Recent Labs    03/22/21 0224 03/23/21 0332  AST 48* 27  ALT 41 35  ALKPHOS 69 83  BILITOT 0.3 0.4  PROT 6.4* 6.9  ALBUMIN 3.7 4.0   No results for input(s): LIPASE, AMYLASE in the last 72 hours. High Sensitivity Troponin:   Recent Labs  Lab 03/20/21 0526 03/20/21 1143 03/20/21 1847  TROPONINIHS 9,431* >24,000* >24,000*    BNP Invalid input(s): POCBNP D-Dimer No results for input(s): DDIMER in the last 72 hours. Hemoglobin A1C No results for input(s): HGBA1C in the last 72 hours. Fasting Lipid Panel No results for input(s): CHOL, HDL, LDLCALC, TRIG, CHOLHDL, LDLDIRECT in the last 72 hours. Thyroid Function Tests No results for input(s): TSH, T4TOTAL, T3FREE, THYROIDAB in the last 72 hours.  Invalid input(s):  FREET3 _____________  DG Chest 1 View  Result Date: 03/21/2021 CLINICAL DATA:  Chest pain, contusion EXAM: CHEST  1 VIEW COMPARISON:  Chest x-ray 03/20/2021 FINDINGS: Heart size and mediastinal contours are within normal limits. No suspicious pulmonary opacities identified. No pleural effusion or pneumothorax visualized. Right-sided ventriculoperitoneal shunt catheter. No acute osseous abnormality appreciated. IMPRESSION: No acute intrathoracic process identified. Electronically Signed  By: Ofilia Neas M.D.   On: 03/21/2021 09:16   DG Chest 2 View  Result Date: 03/21/2021 CLINICAL DATA:  Right rib pain after CPR. EXAM: CHEST - 2 VIEW COMPARISON:  Earlier today FINDINGS: The heart size and mediastinal contours are within normal limits. Both lungs are clear. Right sided ventriculoperitoneal shunt catheter is again noted. There is a focal area of cortical discontinuity along the mid body of sternum which may reflect nondisplaced fracture status post CPR. IMPRESSION: 1. No acute cardiopulmonary abnormalities. 2. Cortical discontinuity along the mid body of the sternum may reflect nondisplaced fracture status post CPR. Correlate for any focal tenderness along the sternum. Electronically Signed   By: Kerby Moors M.D.   On: 03/21/2021 12:18   CARDIAC CATHETERIZATION  Result Date: 03/20/2021   CULPRIT LESION: Prox LAD to Mid LAD lesion is 100% stenosed.   A drug-eluting stent was successfully placed using a SYNERGY XD 2.50X28.  Postdilated to 3.1 mm.   Post intervention, there is a 0% residual stenosis.   --------------------------------------   Otherwise angiographically normal RCA, RI and LCx   --------------------------------------   There is moderate left ventricular systolic dysfunction.  The left ventricular ejection fraction is 35-45% by visual estimate.   LV end diastolic pressure is severely elevated.   There is no aortic valve stenosis. SUMMARY Cardiac arrest with ventricular fibrillation and  ROSC secondary to Anterior STEMI Severe single-vessel CAD with 100% thrombotic occlusion of the LAD just after D1 Successful DES PCI of LAD restoring TIMI-3 flow using Synergy XD DES 2.5 x 28 mm stent postdilated to 3.1 mm. Acute combined systolic and diastolic heart failure: EF estimated roughly 40% by LV gram with EDP of 28 to 30 mmHg. RECOMMENDATIONS We will continue lidocaine and Aggrastat drip for 2 to 3 hours post PCI to allow for further stabilization Check 2D echocardiogram on March 2 to allow time for stabilization Given 4 mg IV Lasix post-cath-reassess Start low-dose beta-blocker-carvedilol 3.125 mg twice daily.  We will need to titrate as blood pressure tolerates and consider addition of ARB/ARNI and spironolactone. Increase statin dose to 80 mg daily Smoking station counseling Glenetta Hew, MD  ECHOCARDIOGRAM COMPLETE  Result Date: 03/21/2021    ECHOCARDIOGRAM REPORT   Patient Name:   Maria Ellis Date of Exam: 03/21/2021 Medical Rec #:  [id number]     Height:       63.0 in Accession #:    [id number]    Weight:       193.1 lb Date of Birth:  1985/02/23     BSA:          1.905 m Patient Age:    36 years      BP:           99/68 mmHg Patient Gender: F             HR:           82 bpm. Exam Location:  Inpatient Procedure: 2D Echo, Cardiac Doppler, Color Doppler and Intracardiac            Opacification Agent Indications:    CAD MI  History:        Patient has no prior history of Echocardiogram examinations.                 3/1/20223 cath with stent placement.  Sonographer:    Luisa Hart RDCS Referring Phys: [place]  1. Left ventricular ejection fraction, by estimation, is 35  to 40%. Left ventricular ejection fraction by 2D MOD biplane is 37.6 %. The left ventricle has moderately decreased function. The left ventricle demonstrates regional wall motion abnormalities (see scoring diagram/findings for description). Left ventricular diastolic parameters are consistent with Grade I  diastolic dysfunction (impaired relaxation). There is moderate hypokinesis of the left ventricular, entire septal wall, anteroseptal wall and apical segment. Findings suggestive of LAD territory ischemia/infarct.  2. Right ventricular systolic function is normal. The right ventricular size is normal. Tricuspid regurgitation signal is inadequate for assessing PA pressure.  3. The mitral valve is grossly normal. Trivial mitral valve regurgitation.  4. The aortic valve is tricuspid. Aortic valve regurgitation is not visualized.  5. The inferior vena cava is normal in size with greater than 50% respiratory variability, suggesting right atrial pressure of 3 mmHg. Comparison(s): No prior Echocardiogram. FINDINGS  Left Ventricle: Left ventricular ejection fraction, by estimation, is 35 to 40%. Left ventricular ejection fraction by 2D MOD biplane is 37.6 %. The left ventricle has moderately decreased function. The left ventricle demonstrates regional wall motion abnormalities. Moderate hypokinesis of the left ventricular, entire septal wall, anteroseptal wall and apical segment. Definity contrast agent was given IV to delineate the left ventricular endocardial borders. The left ventricular internal cavity size was normal in size. There is no left ventricular hypertrophy. Left ventricular diastolic parameters are consistent with Grade I diastolic dysfunction (impaired relaxation). Indeterminate filling pressures. Right Ventricle: The right ventricular size is normal. No increase in right ventricular wall thickness. Right ventricular systolic function is normal. Tricuspid regurgitation signal is inadequate for assessing PA pressure. Left Atrium: Left atrial size was normal in size. Right Atrium: Right atrial size was normal in size. Pericardium: There is no evidence of pericardial effusion. Mitral Valve: The mitral valve is grossly normal. Trivial mitral valve regurgitation. Tricuspid Valve: The tricuspid valve is grossly  normal. Tricuspid valve regurgitation is not demonstrated. Aortic Valve: The aortic valve is tricuspid. Aortic valve regurgitation is not visualized. Aortic valve mean gradient measures 2.0 mmHg. Aortic valve peak gradient measures 3.5 mmHg. Aortic valve area, by VTI measures 2.14 cm. Pulmonic Valve: The pulmonic valve was grossly normal. Pulmonic valve regurgitation is not visualized. Aorta: The aortic root and ascending aorta are structurally normal, with no evidence of dilitation. Venous: The inferior vena cava is normal in size with greater than 50% respiratory variability, suggesting right atrial pressure of 3 mmHg. IAS/Shunts: No atrial level shunt detected by color flow Doppler.  LEFT VENTRICLE PLAX 2D                        Biplane EF (MOD) LVIDd:         5.40 cm         LV Biplane EF:   Left LVIDs:         4.40 cm                          ventricular LV PW:         0.80 cm                          ejection LV IVS:        0.70 cm                          fraction by LVOT diam:     1.80 cm  2D MOD LV SV:         38                               biplane is LV SV Index:   20                               37.6 %. LVOT Area:     2.54 cm                                Diastology                                LV e' medial:    7.18 cm/s LV Volumes (MOD)               LV E/e' medial:  9.6 LV vol d, MOD    60.8 ml       LV e' lateral:   4.80 cm/s A2C:                           LV E/e' lateral: 14.3 LV vol d, MOD    87.2 ml A4C: LV vol s, MOD    44.8 ml A2C: LV vol s, MOD    46.4 ml A4C: LV SV MOD A2C:   16.0 ml LV SV MOD A4C:   87.2 ml LV SV MOD BP:    27.5 ml RIGHT VENTRICLE RV Basal diam:  2.80 cm RV Mid diam:    1.90 cm RV S prime:     12.60 cm/s LEFT ATRIUM             Index        RIGHT ATRIUM           Index LA Vol (A2C):   28.5 ml 14.96 ml/m  RA Area:     11.70 cm LA Vol (A4C):   29.5 ml 15.48 ml/m  RA Volume:   28.40 ml  14.91 ml/m LA Biplane Vol: 30.3 ml 15.90 ml/m  AORTIC  VALVE                    PULMONIC VALVE AV Area (Vmax):    2.16 cm     PV Vmax:       0.71 m/s AV Area (Vmean):   2.06 cm     PV Vmean:      57.500 cm/s AV Area (VTI):     2.14 cm     PV VTI:        0.157 m AV Vmax:           93.00 cm/s   PV Peak grad:  2.0 mmHg AV Vmean:          71.900 cm/s  PV Mean grad:  1.0 mmHg AV VTI:            0.177 m AV Peak Grad:      3.5 mmHg AV Mean Grad:      2.0 mmHg LVOT Vmax:         79.00 cm/s LVOT Vmean:        58.200 cm/s LVOT VTI:          0.149 m  LVOT/AV VTI ratio: 0.84  AORTA Ao Asc diam: 3.10 cm MITRAL VALVE MV Area (PHT): 4.99 cm    SHUNTS MV Decel Time: 152 msec    Systemic VTI:  0.15 m MV E velocity: 68.60 cm/s  Systemic Diam: 1.80 cm MV A velocity: 51.80 cm/s MV E/A ratio:  1.32 Lyman Bishop MD Electronically signed by Lyman Bishop MD Signature Date/Time: 03/21/2021/12:58:51 PM    Final    Disposition   Pt is being discharged home today in good condition.  Follow-up Plans & Appointments     Follow-up Information     Richardo Priest, MD Follow up.   Specialties: Cardiology, Radiology Why: Office scheduler will contact you to arrange a follow up, please give [place] a call if you do not hear from our scheduler in 3 business days. Contact information: [place] [address] [phone]                Discharge Instructions     Amb Referral to Cardiac Rehabilitation   Complete by: As directed    To [facility]   Diagnosis:  Coronary Stents STEMI PTCA     After initial evaluation and assessments completed: Virtual Based Care may be provided alone or in conjunction with Phase 2 Cardiac Rehab based on patient barriers.: Yes       Discharge Medications   Allergies as of 03/24/2021       Reactions   Penicillins Anaphylaxis, Swelling      Iodinated Contrast Media Other (See Comments)   Body felt like it was on fire.   Multihance [gadobenate] Nausea Only, Cough   PT HAD SNEEZING AND NAUSEA IMMEDIATELY AFTER CONTRAST INJECTION.     Tylenol [acetaminophen] Rash, Other (See Comments)   Stomach cramping        Medication List     STOP taking these medications    atorvastatin 40 MG tablet Commonly known as: LIPITOR   sucralfate 1 g tablet Commonly known as: CARAFATE       TAKE these medications    Aimovig 70 MG/ML Soaj Generic drug: Erenumab-aooe Apply 70 mg topically every 30 (thirty) days.   aspirin 81 MG chewable tablet Chew 1 tablet (81 mg total) by mouth daily. Start taking on: March 25, 2021   citalopram 20 MG tablet Commonly known as: CELEXA Take 20 mg by mouth at bedtime.   dapagliflozin propanediol 10 MG Tabs tablet Commonly known as: FARXIGA Take 1 tablet (10 mg total) by mouth daily. Start taking on: March 25, 2021   ezetimibe 10 MG tablet Commonly known as: ZETIA Take 1 tablet (10 mg total) by mouth daily. Start taking on: March 25, 2021   famotidine 20 MG tablet Commonly known as: PEPCID Take 20 mg by mouth at bedtime.   fluticasone 50 MCG/ACT nasal spray Commonly known as: FLONASE Place 1 spray into both nostrils in the morning and at bedtime.   losartan 25 MG tablet Commonly known as: COZAAR Take 0.5 tablets (12.5 mg total) by mouth daily. Start taking on: March 25, 2021   metoprolol succinate 25 MG 24 hr tablet Commonly known as: TOPROL-XL Take 0.5 tablets (12.5 mg total) by mouth daily.   nitroGLYCERIN 0.4 MG SL tablet Commonly known as: NITROSTAT Place 1 tablet (0.4 mg total) under the tongue every 5 (five) minutes as needed for chest pain.   ondansetron 4 MG disintegrating tablet Commonly known as: ZOFRAN-ODT Take 4 mg by mouth 2 (two) times daily as needed for nausea/vomiting.  pantoprazole 40 MG tablet Commonly known as: PROTONIX Take 40 mg by mouth daily.   Pataday 0.1 % ophthalmic solution Generic drug: olopatadine 1 drop 2 (two) times daily as needed for allergies.   ProAir HFA 108 (90 Base) MCG/ACT inhaler Generic drug: albuterol Inhale 2 puffs  into the lungs every 4 (four) hours as needed for wheezing or shortness of breath.   promethazine 25 MG tablet Commonly known as: PHENERGAN Take 25 mg by mouth every 6 (six) hours as needed for nausea.   rizatriptan 10 MG disintegrating tablet Commonly known as: Maxalt-MLT Take 1 tablet (10 mg total) by mouth as needed. May repeat in 2 hours if needed What changed: reasons to take this   rosuvastatin 40 MG tablet Commonly known as: CRESTOR Take 1 tablet (40 mg total) by mouth daily. Start taking on: March 25, 2021   Suboxone 8-2 MG Film Generic drug: Buprenorphine HCl-Naloxone HCl Place 1 strip under the tongue daily as needed (withdrawal).   ticagrelor 90 MG Tabs tablet Commonly known as: BRILINTA Take 1 tablet (90 mg total) by mouth 2 (two) times daily.   topiramate 50 MG tablet Commonly known as: TOPAMAX Take 50 mg by mouth daily.   Vitamin D (Ergocalciferol) 1.25 MG (50000 UNIT) Caps capsule Commonly known as: DRISDOL Take 50,000 Units by mouth every Sunday.           Outstanding Labs/Studies   FLP and LFT in 6-8 weeks.   Duration of Discharge Encounter   Greater than 30 minutes including physician time.  Ramond Dial, [place] 03/24/2021, 12:04 PM  Attending Note:   The patient was seen and examined.  Agree with assessment and plan as noted above.  Changes made to the above note as needed.  Patient seen and independently examined with Azalee Course, PA .   We discussed all aspects of the encounter. I agree with the assessment and plan as stated above.       VF arrest:  s/p defib, she transferred from [facility] to [facility] for urgent heart catheterization.  She was found to have an occluded left anterior descending artery which was stented. She is done very well.  She is ambulated in the halls without any issues.  She is not having any further episodes of angina.   2.  Hyperlipidemia: She was previously on atorvastatin.  Continue rosuvastatin 40 mg a day  and Zetia 10 mg a day.  If her lipids are not adequately controlled she will need to consider a PCSK9 inhibitor.     3.  Coronary artery disease: She presented with an acute occlusion of her mid LAD.  Status post stenting of her mid LAD.  Continue aspirin 81 mg a day.  Continue Brilinta 90 mg a day     4.  Acute combined  systolic and diastolic congestive heart failure: Echocardiogram shows ejection fraction of 35 to 40%.  She has grade 1 diastolic dysfunction.  She is on low-dose Toprol-XL, losartan, Farxiga. Currently her blood pressure is fairly low which limits our ability to titrate other medications and.     She is doing well.  Cont current meds.   I have spent a total of 40 minutes with patient reviewing hospital  notes , telemetry, EKGs, labs and examining patient as well as establishing an assessment and plan that was discussed with the patient.  > 50% of time was spent in direct patient care.    Vesta Mixer, Montez Hageman., MD, [facility] 03/25/2021, 9:25  PM [address],  [place] Pager [phone]

## 2021-03-25 ENCOUNTER — Telehealth: Payer: Self-pay

## 2021-03-25 NOTE — Telephone Encounter (Signed)
-----   Message from [place], [place] sent at 03/24/2021 12:06 PM EST ----- ?Regarding: schedule 7 day TOC visit ?This 36 yo lady recently admitted due to anterior STEMI and cardiac arrest. Underwent stent to prox LAD. Cath done by Dr. Ellyn Hack, but per Dr. Acie Fredrickson and patient, she prefers to follow up in [place] by Dr. Bettina Gavia who also sees her aunt. I do not think she has seen Dr. Bettina Gavia herself in the past.  ? ?Please call and arrange 7 day TOC visit with Dr. Bettina Gavia. ? ?Thank you ?Almyra Deforest  ? ?

## 2021-03-25 NOTE — Telephone Encounter (Signed)
Appointment scheduled for 04/03/21 @ 3:40 with Dr Bettina Gavia in [place] office.  ?Patient made aware of appt. ?

## 2021-03-25 NOTE — Telephone Encounter (Signed)
Patient scheduled for 04/03/21  @ 3:40 with Dr Norman Herrlich in [place] office.  ?Patient was notified as well ?

## 2021-03-25 NOTE — Telephone Encounter (Signed)
-----   Message from Dione Housekeeper, [place] sent at 03/25/2021  1:56 PM EST ----- ?Regarding: FW: schedule 7 day TOC visit ? ?----- Message ----- ?From: Azalee Course, PA ?Sent: 03/24/2021  12:08 PM EST ?To: Baldo Daub, MD, Cv Div [facility]/Hp Scheduling ?Subject: schedule 7 day TOC visit                      ? ?This 36 yo lady recently admitted due to anterior STEMI and cardiac arrest. Underwent stent to prox LAD. Cath done by Dr. Herbie Baltimore, but per Dr. Elease Hashimoto and patient, she prefers to follow up in [place] by Dr. Dulce Sellar who also sees her aunt. I do not think she has seen Dr. Dulce Sellar herself in the past.  ? ?Please call and arrange 7 day TOC visit with Dr. Dulce Sellar. ? ?Thank you ?Azalee Course  ? ? ?

## 2021-03-26 ENCOUNTER — Telehealth (HOSPITAL_COMMUNITY): Payer: Self-pay

## 2021-03-26 NOTE — Telephone Encounter (Signed)
Per phase I, fax cardiac rehab referral to [facility].  

## 2021-03-28 ENCOUNTER — Encounter (HOSPITAL_BASED_OUTPATIENT_CLINIC_OR_DEPARTMENT_OTHER): Payer: Self-pay

## 2021-03-28 ENCOUNTER — Telehealth: Payer: Self-pay | Admitting: Cardiology

## 2021-03-28 ENCOUNTER — Emergency Department (HOSPITAL_BASED_OUTPATIENT_CLINIC_OR_DEPARTMENT_OTHER)
Admission: EM | Admit: 2021-03-28 | Discharge: 2021-03-28 | Disposition: A | Discharge status: Home / Self Care | Payer: Medicare Other | Attending: Emergency Medicine | Admitting: Emergency Medicine

## 2021-03-28 ENCOUNTER — Other Ambulatory Visit: Payer: Self-pay

## 2021-03-28 DIAGNOSIS — G2581 Restless legs syndrome: Secondary | ICD-10-CM | POA: Diagnosis not present

## 2021-03-28 DIAGNOSIS — D72829 Elevated white blood cell count, unspecified: Secondary | ICD-10-CM | POA: Insufficient documentation

## 2021-03-28 DIAGNOSIS — E871 Hypo-osmolality and hyponatremia: Secondary | ICD-10-CM | POA: Diagnosis not present

## 2021-03-28 DIAGNOSIS — R031 Nonspecific low blood-pressure reading: Secondary | ICD-10-CM | POA: Insufficient documentation

## 2021-03-28 DIAGNOSIS — Z79899 Other long term (current) drug therapy: Secondary | ICD-10-CM | POA: Insufficient documentation

## 2021-03-28 DIAGNOSIS — Z7982 Long term (current) use of aspirin: Secondary | ICD-10-CM | POA: Insufficient documentation

## 2021-03-28 DIAGNOSIS — E876 Hypokalemia: Secondary | ICD-10-CM | POA: Insufficient documentation

## 2021-03-28 DIAGNOSIS — J4520 Mild intermittent asthma, uncomplicated: Secondary | ICD-10-CM | POA: Diagnosis not present

## 2021-03-28 DIAGNOSIS — N9489 Other specified conditions associated with female genital organs and menstrual cycle: Secondary | ICD-10-CM | POA: Diagnosis not present

## 2021-03-28 DIAGNOSIS — R5383 Other fatigue: Secondary | ICD-10-CM | POA: Diagnosis not present

## 2021-03-28 DIAGNOSIS — G4734 Idiopathic sleep related nonobstructive alveolar hypoventilation: Secondary | ICD-10-CM | POA: Diagnosis not present

## 2021-03-28 HISTORY — DX: Acute myocardial infarction, unspecified: I21.9

## 2021-03-28 LAB — CBC WITH DIFFERENTIAL/PLATELET
Abs Immature Granulocytes: 0.05 10*3/uL (ref 0.00–0.07)
Basophils Absolute: 0.1 10*3/uL (ref 0.0–0.1)
Basophils Relative: 1 %
Eosinophils Absolute: 0.5 10*3/uL (ref 0.0–0.5)
Eosinophils Relative: 4 %
HCT: 35.5 % — ABNORMAL LOW (ref 36.0–46.0)
Hemoglobin: 12.6 g/dL (ref 12.0–15.0)
Immature Granulocytes: 0 %
Lymphocytes Relative: 33 %
Lymphs Abs: 4.2 10*3/uL — ABNORMAL HIGH (ref 0.7–4.0)
MCH: 29.4 pg (ref 26.0–34.0)
MCHC: 35.5 g/dL (ref 30.0–36.0)
MCV: 82.9 fL (ref 80.0–100.0)
Monocytes Absolute: 0.9 10*3/uL (ref 0.1–1.0)
Monocytes Relative: 7 %
Neutro Abs: 7.2 10*3/uL (ref 1.7–7.7)
Neutrophils Relative %: 55 %
Platelets: 307 10*3/uL (ref 150–400)
RBC: 4.28 MIL/uL (ref 3.87–5.11)
RDW: 12.3 % (ref 11.5–15.5)
WBC: 12.9 10*3/uL — ABNORMAL HIGH (ref 4.0–10.5)
nRBC: 0 % (ref 0.0–0.2)

## 2021-03-28 LAB — BASIC METABOLIC PANEL
Anion gap: 10 (ref 5–15)
BUN: 13 mg/dL (ref 6–20)
CO2: 21 mmol/L — ABNORMAL LOW (ref 22–32)
Calcium: 8.7 mg/dL — ABNORMAL LOW (ref 8.9–10.3)
Chloride: 103 mmol/L (ref 98–111)
Creatinine, Ser: 0.67 mg/dL (ref 0.44–1.00)
GFR, Estimated: >60 mL/min (ref >60)
Glucose, Bld: 103 mg/dL — ABNORMAL HIGH (ref 70–99)
Potassium: 3.2 mmol/L — ABNORMAL LOW (ref 3.5–5.1)
Sodium: 134 mmol/L — ABNORMAL LOW (ref 135–145)

## 2021-03-28 LAB — HCG, QUANTITATIVE, PREGNANCY: hCG, Beta Chain, Quant, S: <1 m[IU]/mL (ref <5)

## 2021-03-28 MED ORDER — POTASSIUM CHLORIDE CRYS ER 20 MEQ PO TBCR
40.0000 meq | EXTENDED_RELEASE_TABLET | Freq: Once | ORAL | Status: AC
  Administered 2021-03-28: 20:00:00 40 meq via ORAL
  Filled 2021-03-28: qty 2

## 2021-03-28 MED ORDER — POTASSIUM CHLORIDE ER 10 MEQ PO TBCR: 10.0000 meq | EXTENDED_RELEASE_TABLET | Freq: Every day | ORAL | 0 refills | Status: DC

## 2021-03-28 NOTE — ED Notes (Signed)
Pt tolerating PO intake at this time

## 2021-03-28 NOTE — Telephone Encounter (Signed)
Mother came in to office after being on hold for 15 minutes, with concerns over her daughter's bp. She said currently it is 84/43 about 30-45 minutes ago in Chodri's office. Chodri advised her contact her Cardiologist.  ? ?Please advise ? ?[phone] ? ?Thank you  ? ?

## 2021-03-28 NOTE — Telephone Encounter (Signed)
Spoke with pt who states that she is now home and that her BP is 92/50 HR 67. Pt states she feels ok other than she has rib pain. Please advise. ?

## 2021-03-28 NOTE — ED Notes (Signed)
Pt given oral fluids for PO challenge. ?

## 2021-03-28 NOTE — Telephone Encounter (Signed)
Pt aware of Dr. Hulen Shouts recommendation and verbalized understanding. ?

## 2021-03-28 NOTE — ED Triage Notes (Addendum)
Pt c/o low BP at MD office for f/u from sleep study-states she checks BP daily-today first day of hypotension-states she had heart attack 3/1-denies CP-pain to right rib where she states she has rib fx from CRP-NAD-steady gait ?

## 2021-03-28 NOTE — Discharge Instructions (Addendum)
Your blood pressures were reassuring in the emergency department.  I would keep a log of your blood pressures at home and call your cardiologist tomorrow to schedule an appointment for follow-up.  If you have any new or worsening symptoms in the meantime please return to the emergency department immediately. ? ?Your potassium was noted to be somewhat low in the ED.  You were given a prescription for potassium for the next few days.  You will need to have your potassium rechecked by your primary doctor or cardiologist when you follow-up. ? ?Please follow up with your primary care provider within 5-7 days for re-evaluation of your symptoms. If you do not have a primary care provider, information for a healthcare clinic has been provided for you to make arrangements for follow up care. Please return to the emergency department for any new or worsening symptoms. ? ?

## 2021-03-28 NOTE — ED Provider Notes (Signed)
[facility] EMERGENCY DEPARTMENT Provider Note   CSN: [id number] Arrival date & time: 03/28/21  1732     History  Chief Complaint  Patient presents with   Hypotension    Maria Ellis is a 36 y.o. female.  HPI  36 year old female with a history of Bell's palsy, Chiari malformation, hydrocephalus, recent STEMI with V-fib arrest on 03/20/2021.  She was discharged 03/24/2021.  States that she was at an appointment for a sleep study today and when checking her vital signs her blood pressure was noted to be 85 systolic.  It was checked 2 times and she was then advised to call her cardiologist.  She called her cardiologist who advised her to come to the ED.  She states that she has not noticed any episodes of hypotension since she has been discharged from the hospital.  She actually took her blood pressure this morning and was 111 systolic.  She has not had any blood pressure really beings below 100 systolic.  She denies feeling any lightheadedness, dizziness, or other symptoms related to low blood pressure.  She denies any substernal chest pain or shortness of breath.  States she has been eating and drinking normally but has been trying to stick to a low-sodium diet since being discharged.  Home Medications Prior to Admission medications   Medication Sig Start Date End Date Taking? Authorizing Provider  potassium chloride (KLOR-CON) 10 MEQ tablet Take 1 tablet (10 mEq total) by mouth daily for 4 days. 03/28/21 04/01/21 Yes Tilly Pernice S, PA-C  AIMOVIG 70 MG/ML SOAJ Apply 70 mg topically every 30 (thirty) days. 01/03/21   [provider]  aspirin 81 MG chewable tablet Chew 1 tablet (81 mg total) by mouth daily. 03/25/21   Azalee Course, PA  citalopram (CELEXA) 20 MG tablet Take 20 mg by mouth at bedtime. 01/23/21   [provider]  dapagliflozin propanediol (FARXIGA) 10 MG TABS tablet Take 1 tablet (10 mg total) by mouth daily. 03/25/21   Azalee Course, PA  ezetimibe (ZETIA) 10 MG  tablet Take 1 tablet (10 mg total) by mouth daily. 03/25/21   Azalee Course, PA  famotidine (PEPCID) 20 MG tablet Take 20 mg by mouth at bedtime. 10/18/20   [provider]  fluticasone (FLONASE) 50 MCG/ACT nasal spray Place 1 spray into both nostrils in the morning and at bedtime. 03/12/16   [provider]  losartan (COZAAR) 25 MG tablet Take 0.5 tablets (12.5 mg total) by mouth daily. 03/25/21   Azalee Course, PA  metoprolol succinate (TOPROL-XL) 25 MG 24 hr tablet Take 0.5 tablets (12.5 mg total) by mouth daily. 03/24/21   Azalee Course, PA  nitroGLYCERIN (NITROSTAT) 0.4 MG SL tablet Place 1 tablet (0.4 mg total) under the tongue every 5 (five) minutes as needed for chest pain. 03/24/21   Azalee Course, PA  olopatadine (PATADAY) 0.1 % ophthalmic solution 1 drop 2 (two) times daily as needed for allergies.    [provider]  ondansetron (ZOFRAN-ODT) 4 MG disintegrating tablet Take 4 mg by mouth 2 (two) times daily as needed for nausea/vomiting. 02/24/21   [provider]  pantoprazole (PROTONIX) 40 MG tablet Take 40 mg by mouth daily. 03/14/21   [provider]  PROAIR HFA 108 (90 Base) MCG/ACT inhaler Inhale 2 puffs into the lungs every 4 (four) hours as needed for wheezing or shortness of breath.  04/11/16   [provider]  promethazine (PHENERGAN) 25 MG tablet Take 25 mg by mouth every  6 (six) hours as needed for nausea.  03/12/16   [provider]  rizatriptan (MAXALT-MLT) 10 MG disintegrating tablet Take 1 tablet (10 mg total) by mouth as needed. May repeat in 2 hours if needed Patient taking differently: Take 10 mg by mouth as needed for migraine. May repeat in 2 hours if needed 04/06/17   Nilda Riggs, NP  rosuvastatin (CRESTOR) 40 MG tablet Take 1 tablet (40 mg total) by mouth daily. 03/25/21   Azalee Course, PA  SUBOXONE 8-2 MG FILM Place 1 strip under the tongue daily as needed (withdrawal). 03/06/21   [provider]  ticagrelor (BRILINTA) 90  MG TABS tablet Take 1 tablet (90 mg total) by mouth 2 (two) times daily. 03/24/21   Azalee Course, PA  topiramate (TOPAMAX) 50 MG tablet Take 50 mg by mouth daily. 04/11/16   [provider]  Vitamin D, Ergocalciferol, (DRISDOL) 1.25 MG (50000 UNIT) CAPS capsule Take 50,000 Units by mouth every Sunday.    [provider]      Allergies    Penicillins, Iodinated contrast media, Multihance [gadobenate], and Tylenol [acetaminophen]    Review of Systems   Review of Systems See HPI for pertinent positives or negatives.   Physical Exam Updated Vital Signs BP (!) 100/58    Pulse (!) 58    Temp 98.5 F (36.9 C) (Oral)    Resp 18    Ht  (1.6 m)    Wt 80.3 kg    LMP 03/21/2021    SpO2 100%    BMI 31.35 kg/m  Physical Exam Vitals and nursing note reviewed.  Constitutional:      General: She is not in acute distress.    Appearance: She is well-developed.  HENT:     Head: Normocephalic and atraumatic.  Eyes:     Conjunctiva/sclera: Conjunctivae normal.  Cardiovascular:     Rate and Rhythm: Normal rate and regular rhythm.     Heart sounds: Normal heart sounds. No murmur heard. Pulmonary:     Effort: Pulmonary effort is normal. No respiratory distress.     Breath sounds: Normal breath sounds. No wheezing, rhonchi or rales.  Abdominal:     General: Bowel sounds are normal.     Palpations: Abdomen is soft.     Tenderness: There is no abdominal tenderness.  Musculoskeletal:        General: No swelling.     Cervical back: Neck supple.  Skin:    General: Skin is warm and dry.     Capillary Refill: Capillary refill takes less than 2 seconds.  Neurological:     Mental Status: She is alert.  Psychiatric:        Mood and Affect: Mood normal.    ED Results / Procedures / Treatments   Labs (all labs ordered are listed, but only abnormal results are displayed) Labs Reviewed  BASIC METABOLIC PANEL - Abnormal; Notable for the following components:      Result Value   Sodium  134 (*)    Potassium 3.2 (*)    CO2 21 (*)    Glucose, Bld 103 (*)    Calcium 8.7 (*)    All other components within normal limits  CBC WITH DIFFERENTIAL/PLATELET - Abnormal; Notable for the following components:   WBC 12.9 (*)    HCT 35.5 (*)    Lymphs Abs 4.2 (*)    All other components within normal limits  HCG, QUANTITATIVE, PREGNANCY  CBC WITH DIFFERENTIAL/PLATELET  EKG EKG Interpretation  Date/Time:  Thursday March 28 2021 18:01:21 EST Ventricular Rate:  61 PR Interval:  158 QRS Duration: 93 QT Interval:  467 QTC Calculation: 471 R Axis:   97 Text Interpretation: Sinus rhythm Probable anterior infarct, age indeterminate No significant change since last tracing Confirmed by Gwyneth Sprout ([id number]) on 03/28/2021 6:19:10 PM  Radiology No results found.  Procedures Procedures    Medications Ordered in ED Medications  potassium chloride SA (KLOR-CON M) CR tablet 40 mEq (40 mEq Oral Given 03/28/21 1958)    ED Course/ Medical Decision Making/ A&P                           Medical Decision Making Amount and/or Complexity of Data Reviewed Labs: ordered.  Risk Prescription drug management.   This patient presents to the ED for concern of a low blood pressure reading, this involves an extensive number of treatment options, and is a complaint that carries with it a high risk of complications and morbidity.  The differential diagnosis includes but is not limited to dehydration, sepsis, anemia, shock   Comorbidities that complicate the patient evaluation: Patients presentation is complicated by their history of recent STEMI  Additional history obtained: Additional history obtained from family Records reviewed previous admission documents and Care Everywhere/External Records  Lab Tests: I Ordered, and personally interpreted labs.  The pertinent results include:   CBC with mild leukocytosis which is downtrending from her previous admission, no anemia With mild  hyponatremia, mild hypokalemia, otherwise no significant abnormalities Hcg neg  EKG - 97 Text Interpretation: Sinus rhythm Probable anterior infarct, age indeterminate No significant change since last tracing  Cardiac Monitoring: The patient was maintained on a cardiac monitor.  I personally viewed and interpreted the cardiac monitor which showed an underlying rhythm of:  sinus rhythm  Complexity of problems addressed: Patients presentation is most consistent with  acute complicated illness/injury requiring diagnostic workup  Disposition: After consideration of the diagnostic results and the patients response to treatment,  I feel that the patent would benefit from discharge home.  Patient presents for evaluation after incidentally being noted to have hypotension had an outpatient sleep study appointment prior to arrival.  She did not note any hypotension at home and she has not had any episodes of syncope or hypotension here in the ED throughout her several hour period of observation.  Her maps have all been above 65.  She is not symptomatic.  We obtained laboratory work which showed some mild hypokalemia which was supplemented in the emergency department.  Her EKG shows no acute ischemic changes or arrhythmia.  I question whether the reading the office may have been inaccurate or symptoms are just transient.  She appears in no distress at this time.  I do not think that she requires any further work-up or admission to the hospital.  I will defer to cardiology for titration of her medications and I advised that she follow-up with her cardiologist tomorrow morning and keep a log of her blood pressures at home.  Advised on specific return precautions.  She voices understanding the plan and reasons to return.  All questions answered.  Patient stable for discharge. .   Case was discussed with supervising physician, Dr. Anitra Lauth who is in agreement with the plan for discharge home with close cardiology  follow-up.   Final Clinical Impression(s) / ED Diagnoses Final diagnoses:  Low blood pressure reading  Hypokalemia  Rx / DC Orders ED Discharge Orders          Ordered    potassium chloride (KLOR-CON) 10 MEQ tablet  Daily        03/28/21 2034              Rayne DuCouture, Anum Palecek S, PA-C 03/28/21 2043    Gwyneth SproutPlunkett, [place], MD 03/29/21 2239

## 2021-04-01 ENCOUNTER — Encounter (HOSPITAL_COMMUNITY): Payer: Self-pay

## 2021-04-01 ENCOUNTER — Ambulatory Visit (HOSPITAL_COMMUNITY)
Admit: 2021-04-01 | Discharge: 2021-04-01 | Disposition: A | Discharge status: Home / Self Care | Payer: Medicare Other | Attending: Cardiology | Admitting: Cardiology

## 2021-04-01 ENCOUNTER — Other Ambulatory Visit: Payer: Self-pay

## 2021-04-01 VITALS — HR 68 | Ht 63.0 in | Wt 178.6 lb

## 2021-04-01 DIAGNOSIS — Z955 Presence of coronary angioplasty implant and graft: Secondary | ICD-10-CM | POA: Insufficient documentation

## 2021-04-01 DIAGNOSIS — I469 Cardiac arrest, cause unspecified: Secondary | ICD-10-CM

## 2021-04-01 DIAGNOSIS — Z982 Presence of cerebrospinal fluid drainage device: Secondary | ICD-10-CM | POA: Insufficient documentation

## 2021-04-01 DIAGNOSIS — Z6831 Body mass index (BMI) 31.0-31.9, adult: Secondary | ICD-10-CM | POA: Diagnosis not present

## 2021-04-01 DIAGNOSIS — I4901 Ventricular fibrillation: Secondary | ICD-10-CM | POA: Diagnosis not present

## 2021-04-01 DIAGNOSIS — E669 Obesity, unspecified: Secondary | ICD-10-CM | POA: Diagnosis not present

## 2021-04-01 DIAGNOSIS — I255 Ischemic cardiomyopathy: Secondary | ICD-10-CM | POA: Diagnosis not present

## 2021-04-01 DIAGNOSIS — Z79899 Other long term (current) drug therapy: Secondary | ICD-10-CM | POA: Insufficient documentation

## 2021-04-01 DIAGNOSIS — Z7982 Long term (current) use of aspirin: Secondary | ICD-10-CM | POA: Diagnosis not present

## 2021-04-01 DIAGNOSIS — Z7902 Long term (current) use of antithrombotics/antiplatelets: Secondary | ICD-10-CM | POA: Insufficient documentation

## 2021-04-01 DIAGNOSIS — Z8489 Family history of other specified conditions: Secondary | ICD-10-CM | POA: Insufficient documentation

## 2021-04-01 DIAGNOSIS — I252 Old myocardial infarction: Secondary | ICD-10-CM | POA: Insufficient documentation

## 2021-04-01 DIAGNOSIS — I2510 Atherosclerotic heart disease of native coronary artery without angina pectoris: Secondary | ICD-10-CM | POA: Insufficient documentation

## 2021-04-01 DIAGNOSIS — Z9861 Coronary angioplasty status: Secondary | ICD-10-CM | POA: Diagnosis not present

## 2021-04-01 DIAGNOSIS — Z87891 Personal history of nicotine dependence: Secondary | ICD-10-CM | POA: Insufficient documentation

## 2021-04-01 DIAGNOSIS — Z8674 Personal history of sudden cardiac arrest: Secondary | ICD-10-CM | POA: Insufficient documentation

## 2021-04-01 DIAGNOSIS — Z7984 Long term (current) use of oral hypoglycemic drugs: Secondary | ICD-10-CM | POA: Insufficient documentation

## 2021-04-01 DIAGNOSIS — R0683 Snoring: Secondary | ICD-10-CM | POA: Insufficient documentation

## 2021-04-01 DIAGNOSIS — I5022 Chronic systolic (congestive) heart failure: Secondary | ICD-10-CM | POA: Diagnosis not present

## 2021-04-01 DIAGNOSIS — Z634 Disappearance and death of family member: Secondary | ICD-10-CM | POA: Insufficient documentation

## 2021-04-01 DIAGNOSIS — I213 ST elevation (STEMI) myocardial infarction of unspecified site: Secondary | ICD-10-CM | POA: Diagnosis not present

## 2021-04-01 DIAGNOSIS — E785 Hyperlipidemia, unspecified: Secondary | ICD-10-CM | POA: Diagnosis not present

## 2021-04-01 LAB — BASIC METABOLIC PANEL
Anion gap: 8 (ref 5–15)
BUN: 5 mg/dL — ABNORMAL LOW (ref 6–20)
CO2: 20 mmol/L — ABNORMAL LOW (ref 22–32)
Calcium: 8.9 mg/dL (ref 8.9–10.3)
Chloride: 110 mmol/L (ref 98–111)
Creatinine, Ser: 0.71 mg/dL (ref 0.44–1.00)
GFR, Estimated: >60 mL/min (ref >60)
Glucose, Bld: 99 mg/dL (ref 70–99)
Potassium: 3.8 mmol/L (ref 3.5–5.1)
Sodium: 138 mmol/L (ref 135–145)

## 2021-04-01 LAB — BRAIN NATRIURETIC PEPTIDE: B Natriuretic Peptide: 117.2 pg/mL — ABNORMAL HIGH (ref 0.0–100.0)

## 2021-04-01 MED ORDER — SPIRONOLACTONE 25 MG PO TABS
12.5000 mg | ORAL_TABLET | Freq: Every day | ORAL | 3 refills | Status: DC
Start: 2021-04-01 — End: 2021-04-03

## 2021-04-01 NOTE — Progress Notes (Signed)
?Heart and Vascular Care Navigation ? ?04/01/2021 ? ?Maria Ellis ?July 21, 1985 ?[id number] ? ?Reason for Referral: Patient seen in HF TOC ?  ?Engaged with patient face to face for initial visit for Heart and Vascular Care Coordination. ?                                                                                                  ?Assessment:  Patient is a 36 yo female who lives with her significant other in a duplex. 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 Patient went on to share about the events of the day that she  had the cardiac arrest. She stated that she was not feeling well and feel out at home and thankful to her significant other who was able to start CPR and call for help. She was later transferred from [facility] to [facility]. Patient became tearful and she and Mom shared about her brother who passed away from cardiac arrest 2 years ago. Patient has social security disability as well as Medicare and Medicaid. She denies any SDoH needs at this time and feels well supported by her Mom and family.                                    ? ?HRT/VAS Care Coordination   ? ? Patients Home Cardiology Office Heart Failure Clinic  HF TOC  ? Outpatient Care Team Social Worker  ? Social Worker Name: Lasandra Beech, [place] [phone]  ? Living arrangements for the past 2 months Single Family Home  Duplex  ? Lives with: Significant Other  ? 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 Patient Has Concern With Paying Medical Bills No  ? Does Patient Have Prescription Coverage? Yes  ? Home Assistive Devices/Equipment Scales  ? ?  ? ? ?Social History:                                                                             ?SDOH Screenings  ? ?Alcohol Screen: Not on file  ?Depression (PHQ2-9): Not on file  ?Financial Resource Strain: Low Risk   ? Difficulty of Paying Living Expenses: Not very hard  ?Food Insecurity: No Food  Insecurity  ? Worried About Programme researcher, broadcasting/film/video in the Last Year: Never true  ? Ran Out of Food in the Last Year: Never true  ?Housing: Low Risk   ? Last Housing Risk Score: 0  ?Physical Activity: Not on file  ?Social Connections: Not on file  ?Stress: Not on file  ?Tobacco Use: Medium Risk  ? Smoking Tobacco Use: Former  ? Smokeless Tobacco Use: Never  ? Passive Exposure: Not on  file  ?Transportation Needs: No Transportation Needs  ? Lack of Transportation (Medical): No  ? Lack of Transportation (Non-Medical): No  ? ? ?SDOH Interventions: ?Financial Resources:  Financial Strain Interventions: Intervention Not Indicated ?N/a  ?Food Insecurity:  Food Insecurity Interventions: Intervention Not Indicated  ?Housing Insecurity:  Housing Interventions: Intervention Not Indicated  ?Transportation:   Transportation Interventions: Intervention Not Indicated  ? ?Follow-up plan:  CSW provided supportive intervention and coping skills. Patient appears to have good support and denies any SDoH needs at this time. CSW available as needed. Lasandra Beech, LCSW, CCSW-MCS [phone] ? ? ? ? ? ?

## 2021-04-01 NOTE — Patient Instructions (Signed)
START Spironolactone 12.5 mg, one half tab nightly at bedtime ? ?Labs today ?We will only contact you if something comes back abnormal or we need to make some changes. ?Otherwise no news is good news! ? ?Keep cardiology follow up as scheduled ? ? ? ? ?Do the following things EVERYDAY: ?Weigh yourself in the morning before breakfast. Write it down and keep it in a log. ?Take your medicines as prescribed ?Eat low salt foods--Limit salt (sodium) to 2000 mg per day.  ?Stay as active as you can everyday ?Limit all fluids for the day to less than 2 liters ? ? ?

## 2021-04-01 NOTE — Progress Notes (Signed)
HEART & VASCULAR TRANSITION OF CARE CONSULT NOTE     Referring Physician: Dr. Elease Hashimoto  Primary Care: Eunice Blase, PA-C  Primary Cardiologist: Assign to Dr. Dulce Sellar [facility])   HPI: Referred to clinic by Dr. Elease Hashimoto for heart failure consultation.   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 Also family history of premature death. Her older brother died 2 years ago at age 52 but there was question regarding primary ACS vs drug overdose as cause of death. She had recently presented several times to the ED at [facility] for left sided chest and arm pain but symptoms initially felt to be noncardiac.   On 3/1, she suffered out of hospital VF arrest at home. Immediate bystander CPR was initiated. On EMS arrival, she was in VF w/ achievement of ROSC w/ resuscitation efforts. EKG showed acute anterior STEMI. Transported to [facility]. Emergent LHC showed 100% occluded pLAD. All other vessels patent. Underwent PCI + DES. Echo showed moderately reduced LVEF, 35-40%. RV normal. LDL 147 mg/dL. Hgb A1c 5.1. She was placed on DAPT w/ ASA + Brilinta, high intensity statin w/ Crestor 40, Farxiga 10, Losartan 12.5 and Toprol XL 12.5 mg daily. She had no further VF post revascularization. No LifeVest at discharge. Referred to [facility] clinic.   Post d/c, she ended up going to the ED, as advised by provider during recent sleep clinic appt (being evaluated for OSA). Her BP was noted to be in the 80s systolic but she was entirely asymptomatic. She reports her baseline BP at home since d/c has been in the low 100s systolic. W/u in the ED was unremarkable, other than low K. She was given 1x dose KCl. ED BP was normotensive, w/ MAPs > 65. No meds were discontinued/ reduced. She was advised to f/u w/ cardiology.   She presents to [facility] clinic today for f/u. Here w/ her mother. Denies any further left sided chest/ arm pain. She has some occasional right sided pleuritic CP w/  deep breathing but attributes this to fractured ribs from CPR. She denies dyspnea. No orthopnea/ PND or LEE. Wt has been stable since d/c. Has been walking daily for exercise. No exertional symptoms. Reports full med compliance. BP today 106/68. As noted above, she denies any orthostatic symptoms. Quit smoking since discharge. She has an appointment to establish care w/ Dr. Dulce Sellar in [place] later this week.      Cardiac Testing   Emergent LHC Post VF Arrest/ Acute STEMI 03/20/21  Cath 03/20/2021   CULPRIT LESION: Prox LAD to Mid LAD lesion is 100% stenosed.   A drug-eluting stent was successfully placed using a SYNERGY XD [id number].  Postdilated to 3.1 mm.   Post intervention, there is a 0% residual stenosis.   --------------------------------------   Otherwise angiographically normal RCA, RI and LCx   --------------------------------------   There is moderate left ventricular systolic dysfunction.  The left ventricular ejection fraction is 35-45% by visual estimate.   LV end diastolic pressure is severely elevated.   There is no aortic valve stenosis.   SUMMARY Cardiac arrest with ventricular fibrillation and ROSC secondary to Anterior STEMI Severe single-vessel CAD with 100% thrombotic occlusion of the LAD just after D1 Successful DES PCI of LAD restoring TIMI-3 flow using Synergy XD DES 2.5 x 28 mm stent postdilated to 3.1 mm. Acute combined systolic and diastolic heart failure: EF estimated roughly 40% by LV gram with EDP of 28 to 30 mmHg.  2D Echo 03/21/21  1. Left ventricular ejection fraction, by estimation, is 35 to 40%. Left  ventricular ejection fraction by 2D MOD biplane is 37.6 %. The left  ventricle has moderately decreased function. The left ventricle  demonstrates regional wall motion abnormalities  (see scoring diagram/findings for description). Left ventricular diastolic  parameters are consistent with Grade I diastolic dysfunction (impaired  relaxation). There is  moderate hypokinesis of the left ventricular, entire  septal wall, anteroseptal wall  and apical segment. Findings suggestive of LAD territory ischemia/infarct.   2. Right ventricular systolic function is normal. The right ventricular  size is normal. Tricuspid regurgitation signal is inadequate for assessing  PA pressure.   3. The mitral valve is grossly normal. Trivial mitral valve  regurgitation.   4. The aortic valve is tricuspid. Aortic valve regurgitation is not  visualized.   5. The inferior vena cava is normal in size with greater than 50%  respiratory variability, suggesting right atrial pressure of 3 mmHg.   Review of Systems: [y] = yes, [ ]  = no   General: Weight gain [ ] ; Weight loss [ ] ; Anorexia [ ] ; Fatigue [ ] ; Fever [ ] ; Chills [ ] ; Weakness [ ]   Cardiac: Chest pain/pressure [ ] ; Resting SOB [ ] ; Exertional SOB [ ] ; Orthopnea [ ] ; Pedal Edema [ ] ; Palpitations [ ] ; Syncope [ ] ; Presyncope [ ] ; Paroxysmal nocturnal dyspnea[ ]   Pulmonary: Cough [ ] ; Wheezing[ ] ; Hemoptysis[ ] ; Sputum [ ] ; Snoring [ ]   GI: Vomiting[ ] ; Dysphagia[ ] ; Melena[ ] ; Hematochezia [ ] ; Heartburn[ ] ; Abdominal pain [ ] ; Constipation [ ] ; Diarrhea [ ] ; BRBPR [ ]   GU: Hematuria[ ] ; Dysuria [ ] ; Nocturia[ ]   Vascular: Pain in legs with walking [ ] ; Pain in feet with lying flat [ ] ; Non-healing sores [ ] ; Stroke [ ] ; TIA [ ] ; Slurred speech [ ] ;  Neuro: Headaches[ ] ; Vertigo[ ] ; Seizures[ ] ; Paresthesias[ ] ;Blurred vision [ ] ; Diplopia [ ] ; Vision changes [ ]   Ortho/Skin: Arthritis [ ] ; Joint pain [ ] ; Muscle pain [ ] ; Joint swelling [ ] ; Back Pain [ ] ; Rash [ ]   Psych: Depression[ ] ; Anxiety[ ]   Heme: Bleeding problems [ ] ; Clotting disorders [ ] ; Anemia [ ]   Endocrine: Diabetes [ ] ; Thyroid dysfunction[ ]    Past Medical History:  Diagnosis Date   Bell's palsy    Headache    History of Chiari malformation    Hydrocephalus (HCC)    Myocardial infarct (HCC)     Current Outpatient Medications   Medication Sig Dispense Refill   AIMOVIG 70 MG/ML SOAJ Apply 70 mg topically every 30 (thirty) days.     aspirin 81 MG chewable tablet Chew 1 tablet (81 mg total) by mouth daily.     citalopram (CELEXA) 20 MG tablet Take 20 mg by mouth at bedtime.     dapagliflozin propanediol (FARXIGA) 10 MG TABS tablet Take 1 tablet (10 mg total) by mouth daily. 30 tablet 11   ezetimibe (ZETIA) 10 MG tablet Take 1 tablet (10 mg total) by mouth daily. 90 tablet 3   losartan (COZAAR) 25 MG tablet Take 0.5 tablets (12.5 mg total) by mouth daily. 45 tablet 2   metoprolol succinate (TOPROL-XL) 25 MG 24 hr tablet Take 0.5 tablets (12.5 mg total) by mouth daily. 45 tablet 2   olopatadine (PATANOL) 0.1 % ophthalmic solution 1 drop 2 (two) times daily as needed for allergies.     ondansetron (ZOFRAN-ODT) 4 MG disintegrating tablet  Take 4 mg by mouth 2 (two) times daily as needed for nausea/vomiting.     PROAIR HFA 108 (90 Base) MCG/ACT inhaler Inhale 2 puffs into the lungs every 4 (four) hours as needed for wheezing or shortness of breath.   99   promethazine (PHENERGAN) 25 MG tablet Take 25 mg by mouth every 6 (six) hours as needed for nausea.   99   rizatriptan (MAXALT-MLT) 10 MG disintegrating tablet Take 1 tablet (10 mg total) by mouth as needed. May repeat in 2 hours if needed (Patient taking differently: Take 10 mg by mouth as needed for migraine. May repeat in 2 hours if needed) 12 tablet 11   rosuvastatin (CRESTOR) 40 MG tablet Take 1 tablet (40 mg total) by mouth daily. 90 tablet 3   spironolactone (ALDACTONE) 25 MG tablet Take 0.5 tablets (12.5 mg total) by mouth at bedtime. 90 tablet 3   SUBOXONE 8-2 MG FILM Place 1 strip under the tongue daily as needed (withdrawal).     ticagrelor (BRILINTA) 90 MG TABS tablet Take 1 tablet (90 mg total) by mouth 2 (two) times daily. 60 tablet 11   topiramate (TOPAMAX) 50 MG tablet Take 50 mg by mouth daily.  2   Vitamin D, Ergocalciferol, (DRISDOL) 1.25 MG (50000 UNIT) CAPS  capsule Take 50,000 Units by mouth every Sunday.     famotidine (PEPCID) 20 MG tablet Take 20 mg by mouth at bedtime.     fluticasone (FLONASE) 50 MCG/ACT nasal spray Place 1 spray into both nostrils in the morning and at bedtime.  99   nitroGLYCERIN (NITROSTAT) 0.4 MG SL tablet Place 1 tablet (0.4 mg total) under the tongue every 5 (five) minutes as needed for chest pain. (Patient not taking: Reported on 04/01/2021) 25 tablet 3   No current facility-administered medications for this encounter.    Allergies  Allergen Reactions   Penicillins Anaphylaxis and Swelling        Iodinated Contrast Media Other (See Comments)    Body felt like it was on fire.   Multihance [Gadobenate] Nausea Only and Cough    PT HAD SNEEZING AND NAUSEA IMMEDIATELY AFTER CONTRAST INJECTION.     Tylenol [Acetaminophen] Rash and Other (See Comments)    Stomach cramping      Social History   Socioeconomic History   Marital status: Single    Spouse name: Not on file   Number of children: 0   Years of education: 12   Highest education level: Not on file  Occupational History   Occupation: Disabled  Tobacco Use   Smoking status: Former    Types: Cigarettes   Smokeless tobacco: Never   Tobacco comments:    Trying to quit - smoking about 2 cigarettes per week.  Vaping Use   Vaping Use: Never used  Substance and Sexual Activity   Alcohol use: No    Alcohol/week: 0.0 standard drinks   Drug use: No   Sexual activity: Not on file  Other Topics Concern   Not on file  Social History Narrative   Lives at home with her mother.   Left-handed.   8-9 cups caffeine per day.   Social Determinants of Health   Financial Resource Strain: Low Risk    Difficulty of Paying Living Expenses: Not very hard  Food Insecurity: No Food Insecurity   Worried About Running Out of Food in the Last Year: Never true   Ran Out of Food in the Last Year: Never true  Transportation Needs:  No Transportation Needs   Lack of  Transportation (Medical): No   Lack of Transportation (Non-Medical): No  Physical Activity: Not on file  Stress: Not on file  Social Connections: Not on file  Intimate Partner Violence: Not on file      Family History  Problem Relation Age of Onset   Diabetes Mother    Diabetes Father    Heart failure Father    Cirrhosis Father     Vitals:   04/01/21 0907  BP: 106/68  Pulse: 68  SpO2: 98%  Weight: 81 kg  Height: 5\' 3"  (1.6 m)    PHYSICAL EXAM: General:  Well appearing. No respiratory difficulty HEENT: normal Neck: supple. no JVD. Carotids 2+ bilat; no bruits. No lymphadenopathy or thryomegaly appreciated. Cor: PMI nondisplaced. Regular rate & rhythm. No rubs, gallops or murmurs. Lungs: clear Abdomen: moderately obese, soft, nontender, nondistended. No hepatosplenomegaly. No bruits or masses. Good bowel sounds. Extremities: no cyanosis, clubbing, rash, edema Neuro: alert & oriented x 3, cranial nerves grossly intact. moves all 4 extremities w/o difficulty. Affect pleasant.  ECG: Not performed    ASSESSMENT & PLAN:  Chronic Systolic Heart Failure - Ischemic CM in setting of high grade obstructive LAD disease   - peri MI echo EF 35-40%, RV normal - now s/p PCI of LAD - suspect EF will improve post revascularization + w/ GDMT - NYHA Class I-II. Euvolemic on exam  - Continue Losartan 12.5 mg daily. BP too soft for Entresto - Continue Farxiga 10 mg daily  - Continue Toprol XL 12.5 daily - Add Spiro 12.5 mg daily. Check BMP today + 7 days  - Discussed daily wts + low sodium diet - She will be followed by cardiology. Will need continued gradual med titration as BP allows - recommend repeat Echo in 2-3 months    2. Post VF Arrest - in setting of acute anterior STEMI - immediate bystander CPR>>ROSC  - s/p LAD PCI - no residual neurological deficits  - EF 35-40%, no indication for ICD at this time   3. CAD s/p Anterior STEMI  - LHC w/ single vessel CAD, 100%  occluded pLAD - s/p PCI + DES - no further ischemic CP - DAPT w/ ASA + Brilinta  - on high intensity statin + ? blocker - will add low dose Spiro, 12.5 mg daily  - recommend cardiac rehab   4. Hyperlipidemia, LDL Goal < 70  - LDL 147 mg/dL - recently started on Crestor 40 mg  - LP to be followed by Dr. Bettina Gavia - recommend repeat LP in 6-8 wks   5. H/o Tobacco Abuse - quit smoking after MI - congratulated on her efforts  6. H/o Snoring - being evaluated for OSA   7. Obesity  - Body mass index is 31.64 kg/m. - has been walking daily for exercise - she has been enrolled in cardiac rehab   NYHA I-II GDMT  Diuretic-No loop diuretic requirements currently  BB- Toprol XL 12.5 mg daily  Ace/ARB/ARNI Losartan 12.5 mg daily  MRA Spiro 12.5 mg daily  SGLT2i Farxiga 10 mg daily     Referred to HFSW (PCP, Medications, Transportation, ETOH Abuse, Drug Abuse, Insurance, Museum/gallery curator ):  No Refer to Pharmacy: No Refer to Home Health: No Refer to Advanced Heart Failure Clinic: No Refer to General Cardiology: Yes (Dr. Bettina Gavia)   Follow up  w/ Dr. Bettina Gavia in [place]

## 2021-04-02 ENCOUNTER — Telehealth (HOSPITAL_COMMUNITY): Payer: Self-pay | Admitting: *Deleted

## 2021-04-02 DIAGNOSIS — G919 Hydrocephalus, unspecified: Secondary | ICD-10-CM | POA: Insufficient documentation

## 2021-04-02 DIAGNOSIS — Z955 Presence of coronary angioplasty implant and graft: Secondary | ICD-10-CM | POA: Diagnosis not present

## 2021-04-02 DIAGNOSIS — Z8669 Personal history of other diseases of the nervous system and sense organs: Secondary | ICD-10-CM | POA: Insufficient documentation

## 2021-04-02 DIAGNOSIS — R519 Headache, unspecified: Secondary | ICD-10-CM | POA: Insufficient documentation

## 2021-04-02 DIAGNOSIS — I5022 Chronic systolic (congestive) heart failure: Secondary | ICD-10-CM

## 2021-04-02 DIAGNOSIS — I2510 Atherosclerotic heart disease of native coronary artery without angina pectoris: Secondary | ICD-10-CM | POA: Diagnosis not present

## 2021-04-02 NOTE — Telephone Encounter (Signed)
-----   Message from Allayne Butcher, [place] sent at 04/01/2021  2:13 PM EDT ----- ?Labs stable. Repeat BMP in 1 week w/ spiro addition. Can be done in [place].   ?

## 2021-04-02 NOTE — Telephone Encounter (Signed)
Scarlette Calico, RN  ?04/02/2021  4:53 PM EDT Back to Top  ?  ?Pt aware, agreeable, and verbalized understanding. Order for repeat labs mailed to her home address   ? ?

## 2021-04-03 ENCOUNTER — Encounter: Payer: Self-pay | Admitting: Cardiology

## 2021-04-03 ENCOUNTER — Ambulatory Visit (INDEPENDENT_AMBULATORY_CARE_PROVIDER_SITE_OTHER): Payer: Medicare Other | Admitting: Cardiology

## 2021-04-03 ENCOUNTER — Other Ambulatory Visit: Payer: Self-pay

## 2021-04-03 VITALS — BP 90/52 | HR 62 | Ht 62.0 in | Wt 179.2 lb

## 2021-04-03 DIAGNOSIS — I2510 Atherosclerotic heart disease of native coronary artery without angina pectoris: Secondary | ICD-10-CM

## 2021-04-03 DIAGNOSIS — Z9861 Coronary angioplasty status: Secondary | ICD-10-CM

## 2021-04-03 DIAGNOSIS — Z955 Presence of coronary angioplasty implant and graft: Secondary | ICD-10-CM | POA: Diagnosis not present

## 2021-04-03 DIAGNOSIS — I5022 Chronic systolic (congestive) heart failure: Secondary | ICD-10-CM | POA: Diagnosis not present

## 2021-04-03 DIAGNOSIS — I255 Ischemic cardiomyopathy: Secondary | ICD-10-CM | POA: Diagnosis not present

## 2021-04-03 DIAGNOSIS — E7801 Familial hypercholesterolemia: Secondary | ICD-10-CM | POA: Diagnosis not present

## 2021-04-03 NOTE — Progress Notes (Signed)
[facility]   Cardiology Office Note:    Date:  04/03/2021   ID:  [facility], DOB [birth year], MRN [id number]  PCP:  Maria Blase, PA-C  Cardiologist:  Maria Herrlich, MD    Referring MD: [facility]    ASSESSMENT:    1. CAD S/P percutaneous coronary angioplasty   2. Ischemic cardiomyopathy   3. Chronic systolic heart failure (HCC)   4. Familial hypercholesterolemia    PLAN:    In order of problems listed above:  Overall she is steadily improving we will continue on cardiac rehabilitation status post anterior MI PCI and successful resuscitation VF at home.  I will cut back on her spironolactone and ARB to avoid hypotension and attempt not to discontinue any of the medications if at all possible we will watch closely in her cardiac rehab program Heart failure is compensated continue current meds currently not on loop diuretic Next visit reassess lipids as well as LP(a) and likely will need coincident treatment with a PCSK9 inhibitor I suspect she has familial hyperlipidemia   Next appointment: 6 weeks   Medication Adjustments/Labs and Tests Ordered: Current medicines are reviewed at length with the patient today.  Concerns regarding medicines are outlined above.  Maria orders of the defined types were placed in this encounter.  Maria orders of the defined types were placed in this encounter.   Chief Complaint  Patient presents with   Follow-up   Coronary Artery Disease   Hypotension    History of Present Illness:    [facility] is a 36 y.o. female with a hx of hydrocephalus and Chiari malformation Bell's palsy recent out of [facility] VF arrest with successful resuscitation and urgent heart catheterization PCI and stent at [facility] last seen discharge 03/24/2021.  She was recently discharged [facility] admit 03/20/2021 discharge 03/24/2021 she presented to the [facility] with ventricular fibrillation and anterior ST  segment elevation MI.  She underwent left heart catheterization showing severe single-vessel CAD with 100% occlusion of the left anterior descending in the midportion after the first diagonal branch and had successful PCI and drug-eluting stent to the LAD subsequently EF was estimated 40% and elevated left ventricular filling pressure EDP 28 to 30 mmHg.  She was treated with IV diuretic for her heart failure.  Subsequent echocardiogram confirmed LV dysfunction EF 35 to 50% with normal right ventricular function and Maria significant valvular abnormality.  She subsequently phoned triage 03/28/2020 with complaints of hypotension and chest pain and was promptly referred back to the emergency room for evaluation.  ED showed blood pressure 100/58 pulse 58 mm per mercury her EKG showed sinus rhythm age-indeterminate myocardial infarction Maria acute ST segment elevation she was felt to be stable and discharged home care.  Laboratory studies showed a creatinine 0.67 GFR greater than 60 cc potassium is diminished 3.2 hemoglobin 12.6.  Compliance with diet, lifestyle and medications: Yes  Her mother is present she is a nurse at [facility] is very helpful with her daughter They are somewhat upset about her prior ED visit at [facility] and they have asked me to speak with the director of the emergency room. She has steadily improved since discharge she is walking and doing activities is not tired she is not lightheaded Maria edema shortness of breath palpitation or syncope Unfortunately she had hypotension in cardiac rehab and here in the office even when I recheck her blood pressure she is 90/60. I am going  to cut back on her ARB to 3 days a week and her spironolactone every other day. I encouraged her to continue in cardiac rehabilitation She can drive locally She has a smart watch and I asked the mother to set the heart rate limits of 40 and 130.  Seen in advanced heart failure clinic 04/01/2020 she is on  appropriate pillars for heart failure and cardiomyopathy Past Medical History:  Diagnosis Date   Bell's palsy    Headache    History of Chiari malformation    Hydrocephalus (HCC)    Myocardial infarct [facility])     Past Surgical History:  Procedure Laterality Date   BRAIN SURGERY     CORONARY ANGIOPLASTY WITH STENT PLACEMENT     CORONARY/GRAFT ACUTE MI REVASCULARIZATION N/A 03/20/2021   Procedure: Coronary/Graft Acute MI Revascularization;  Surgeon: Maria Lex, MD;  Location: [facility];  Service: Cardiovascular;  Laterality: N/A;   LAPAROSCOPIC APPENDECTOMY Left 05/18/2013   Procedure: APPENDECTOMY LAPAROSCOPIC;  Surgeon: Velora Heckler, MD;  Location: [facility];  Service: General;  Laterality: Left;   LEFT HEART CATH AND CORONARY ANGIOGRAPHY N/A 03/20/2021   Procedure: LEFT HEART CATH AND CORONARY ANGIOGRAPHY;  Surgeon: Maria Lex, MD;  Location: [facility];  Service: Cardiovascular;  Laterality: N/A;   MULTIPLE TOOTH EXTRACTIONS     SHUNT REPLACEMENT     x 2   TONSILECTOMY, ADENOIDECTOMY, BILATERAL MYRINGOTOMY AND TUBES      Current Medications: Current Meds  Medication Sig   AIMOVIG 70 MG/ML SOAJ Apply 70 mg topically every 30 (thirty) days.   aspirin 81 MG chewable tablet Chew 1 tablet (81 mg total) by mouth daily.   citalopram (CELEXA) 20 MG tablet Take 20 mg by mouth at bedtime.   dapagliflozin propanediol (FARXIGA) 10 MG TABS tablet Take 1 tablet (10 mg total) by mouth daily.   ezetimibe (ZETIA) 10 MG tablet Take 1 tablet (10 mg total) by mouth daily.   famotidine (PEPCID) 20 MG tablet Take 20 mg by mouth at bedtime.   fluticasone (FLONASE) 50 MCG/ACT nasal spray Place 1 spray into both nostrils in the morning and at bedtime.   losartan (COZAAR) 25 MG tablet Take 12.5 mg by mouth every other day.   metoprolol succinate (TOPROL-XL) 25 MG 24 hr tablet Take 0.5 tablets (12.5 mg total) by mouth daily.   nitroGLYCERIN (NITROSTAT) 0.4 MG SL tablet Place 1  tablet (0.4 mg total) under the tongue every 5 (five) minutes as needed for chest pain.   olopatadine (PATANOL) 0.1 % ophthalmic solution 1 drop 2 (two) times daily as needed for allergies.   ondansetron (ZOFRAN-ODT) 4 MG disintegrating tablet Take 4 mg by mouth 2 (two) times daily as needed for nausea/vomiting.   PROAIR HFA 108 (90 Base) MCG/ACT inhaler Inhale 2 puffs into the lungs every 4 (four) hours as needed for wheezing or shortness of breath.    promethazine (PHENERGAN) 25 MG tablet Take 25 mg by mouth every 6 (six) hours as needed for nausea.    rizatriptan (MAXALT-MLT) 10 MG disintegrating tablet Take 1 tablet (10 mg total) by mouth as needed. May repeat in 2 hours if needed (Patient taking differently: Take 10 mg by mouth as needed for migraine. May repeat in 2 hours if needed)   rosuvastatin (CRESTOR) 40 MG tablet Take 1 tablet (40 mg total) by mouth daily.   spironolactone (ALDACTONE) 25 MG tablet Take 12.5 mg by mouth 3 (three) times a week. On Monday,  Wednesday and Friday   SUBOXONE 8-2 MG FILM Place 1 strip under the tongue daily as needed (withdrawal).   ticagrelor (BRILINTA) 90 MG TABS tablet Take 1 tablet (90 mg total) by mouth 2 (two) times daily.   topiramate (TOPAMAX) 50 MG tablet Take 50 mg by mouth daily.   Vitamin D, Ergocalciferol, (DRISDOL) 1.25 MG (50000 UNIT) CAPS capsule Take 50,000 Units by mouth every Sunday.     Allergies:   Penicillins, Iodinated contrast media, Multihance [gadobenate], and Tylenol [acetaminophen]   Social History   Socioeconomic History   Marital status: Single    Spouse name: Not on file   Number of children: 0   Years of education: 12   Highest education level: Not on file  Occupational History   Occupation: Disabled  Tobacco Use   Smoking status: Former    Types: Cigarettes   Smokeless tobacco: Never   Tobacco comments:    Trying to quit - smoking about 2 cigarettes per week.  Vaping Use   Vaping Use: Never used  Substance and  Sexual Activity   Alcohol use: Maria    Alcohol/week: 0.0 standard drinks   Drug use: Maria   Sexual activity: Not on file  Other Topics Concern   Not on file  Social History Narrative   Lives at home with her mother.   Left-handed.   8-9 cups caffeine per day.   Social Determinants of Health   Financial Resource Strain: Low Risk    Difficulty of Paying Living Expenses: Not very hard  Food Insecurity: Maria Food Insecurity   Worried About Programme researcher, broadcasting/film/video in the Last Year: Never true   Ran Out of Food in the Last Year: Never true  Transportation Needs: Maria Transportation Needs   Lack of Transportation (Medical): Maria   Lack of Transportation (Non-Medical): Maria  Physical Activity: Not on file  Stress: Not on file  Social Connections: Not on file     Family History: The patient's family history includes Cirrhosis in her father; Diabetes in her father and mother; Heart failure in her father. ROS:   Please see the history of present illness.    All other systems reviewed and are negative.  EKGs/Labs/Other Studies Reviewed:    The following studies were reviewed today: EKG done 03/28/2021 shows sinus rhythm anterior infarct with some persistent ST upsloping V1 to V4 ischemic T wave inversion  She has had Maria ventricular arrhythmia and cardiac rehabilitation  Recent Labs: 03/21/2021: Magnesium 2.2 03/23/2021: ALT 35 03/28/2021: Hemoglobin 12.6; Platelets 307 04/01/2021: B Natriuretic Peptide 117.2; BUN 5; Creatinine, Ser 0.71; Potassium 3.8; Sodium 138  Recent Lipid Panel    Component Value Date/Time   CHOL 199 03/20/2021 0526   TRIG 33 03/20/2021 0526   HDL 45 03/20/2021 0526   CHOLHDL 4.4 03/20/2021 0526   VLDL 7 03/20/2021 0526   LDLCALC 147 (H) 03/20/2021 0526    Physical Exam:    VS:  BP (!) 90/52 (BP Location: Left Arm)   Pulse 62   Ht 5\' 2"  (1.575 m)   Wt 179 lb 3.2 oz (81.3 kg)   LMP 03/21/2021   SpO2 98%   BMI 32.78 kg/m     Wt Readings from Last 3 Encounters:   04/03/21 179 lb 3.2 oz (81.3 kg)  04/01/21 178 lb 9.6 oz (81 kg)  03/28/21 177 lb (80.3 kg)     GEN: Does not look acutely ill well nourished, well developed in Maria acute distress HEENT: Normal  NECK: Maria JVD; Maria carotid bruits LYMPHATICS: Maria lymphadenopathy CARDIAC: RRR, Maria murmurs, rubs, gallops RESPIRATORY:  Clear to auscultation without rales, wheezing or rhonchi  ABDOMEN: Soft, non-tender, non-distended MUSCULOSKELETAL:  Maria edema; Maria deformity  SKIN: Warm and dry NEUROLOGIC:  Alert and oriented x 3 PSYCHIATRIC:  Normal affect    Signed, Maria Herrlich, MD  04/03/2021 4:32 PM     [facility]

## 2021-04-03 NOTE — Patient Instructions (Signed)
Medication Instructions:  ?Your physician has recommended you make the following change in your medication:  ?Decrease Losartan to every other day ?Decrease Spironolactone to 1/2 tablet = 12.5 mg M-W-F ? ?*If you need a refill on your cardiac medications before your next appointment, please call your pharmacy* ? ? ?Lab Work: ?NONE ?If you have labs (blood work) drawn today and your tests are completely normal, you will receive your results only by: ?MyChart Message (if you have MyChart) OR ?A paper copy in the mail ?If you have any lab test that is abnormal or we need to change your treatment, we will call you to review the results. ? ? ?Testing/Procedures: ?NONE ? ? ?Follow-Up: ?At [facility], you and your health needs are our priority.  As part of our continuing mission to provide you with exceptional heart care, we have created designated Provider Care Teams.  These Care Teams include your primary Cardiologist (physician) and Advanced Practice Providers (APPs -  Physician Assistants and Nurse Practitioners) who all work together to provide you with the care you need, when you need it. ? ?We recommend signing up for the patient portal called "MyChart".  Sign up information is provided on this After Visit Summary.  MyChart is used to connect with patients for Virtual Visits (Telemedicine).  Patients are able to view lab/test results, encounter notes, upcoming appointments, etc.  Non-urgent messages can be sent to your provider as well.   ?To learn more about what you can do with MyChart, go to [email or web address].   ? ?Your next appointment:   ?6 week(s) ? ?The format for your next appointment:   ?In Person ? ?Provider:   ?Norman Herrlich, MD  ? ? ?Other Instructions ?  ?

## 2021-04-04 DIAGNOSIS — K625 Hemorrhage of anus and rectum: Secondary | ICD-10-CM | POA: Diagnosis not present

## 2021-04-04 DIAGNOSIS — I959 Hypotension, unspecified: Secondary | ICD-10-CM | POA: Diagnosis not present

## 2021-04-04 DIAGNOSIS — T7840XA Allergy, unspecified, initial encounter: Secondary | ICD-10-CM | POA: Diagnosis not present

## 2021-04-04 DIAGNOSIS — I25119 Atherosclerotic heart disease of native coronary artery with unspecified angina pectoris: Secondary | ICD-10-CM | POA: Diagnosis not present

## 2021-04-04 DIAGNOSIS — I509 Heart failure, unspecified: Secondary | ICD-10-CM | POA: Diagnosis not present

## 2021-04-04 DIAGNOSIS — E782 Mixed hyperlipidemia: Secondary | ICD-10-CM | POA: Diagnosis not present

## 2021-04-05 ENCOUNTER — Encounter: Payer: Self-pay | Admitting: Cardiology

## 2021-04-05 DIAGNOSIS — Z955 Presence of coronary angioplasty implant and graft: Secondary | ICD-10-CM | POA: Diagnosis not present

## 2021-04-05 DIAGNOSIS — I2510 Atherosclerotic heart disease of native coronary artery without angina pectoris: Secondary | ICD-10-CM | POA: Diagnosis not present

## 2021-04-08 DIAGNOSIS — Z955 Presence of coronary angioplasty implant and graft: Secondary | ICD-10-CM | POA: Diagnosis not present

## 2021-04-08 DIAGNOSIS — I2510 Atherosclerotic heart disease of native coronary artery without angina pectoris: Secondary | ICD-10-CM | POA: Diagnosis not present

## 2021-04-09 DIAGNOSIS — I5022 Chronic systolic (congestive) heart failure: Secondary | ICD-10-CM | POA: Diagnosis not present

## 2021-04-09 DIAGNOSIS — Z79899 Other long term (current) drug therapy: Secondary | ICD-10-CM | POA: Diagnosis not present

## 2021-04-09 DIAGNOSIS — G4734 Idiopathic sleep related nonobstructive alveolar hypoventilation: Secondary | ICD-10-CM | POA: Diagnosis not present

## 2021-04-10 DIAGNOSIS — I2510 Atherosclerotic heart disease of native coronary artery without angina pectoris: Secondary | ICD-10-CM | POA: Diagnosis not present

## 2021-04-10 DIAGNOSIS — Z955 Presence of coronary angioplasty implant and graft: Secondary | ICD-10-CM | POA: Diagnosis not present

## 2021-04-12 DIAGNOSIS — Z955 Presence of coronary angioplasty implant and graft: Secondary | ICD-10-CM | POA: Diagnosis not present

## 2021-04-12 DIAGNOSIS — I2510 Atherosclerotic heart disease of native coronary artery without angina pectoris: Secondary | ICD-10-CM | POA: Diagnosis not present

## 2021-04-15 DIAGNOSIS — Z955 Presence of coronary angioplasty implant and graft: Secondary | ICD-10-CM | POA: Diagnosis not present

## 2021-04-15 DIAGNOSIS — I2510 Atherosclerotic heart disease of native coronary artery without angina pectoris: Secondary | ICD-10-CM | POA: Diagnosis not present

## 2021-04-16 ENCOUNTER — Encounter: Payer: Self-pay | Admitting: Cardiology

## 2021-04-17 ENCOUNTER — Encounter: Payer: Self-pay | Admitting: Cardiology

## 2021-04-17 DIAGNOSIS — Z955 Presence of coronary angioplasty implant and graft: Secondary | ICD-10-CM | POA: Diagnosis not present

## 2021-04-17 DIAGNOSIS — I2510 Atherosclerotic heart disease of native coronary artery without angina pectoris: Secondary | ICD-10-CM | POA: Diagnosis not present

## 2021-04-18 DIAGNOSIS — Z955 Presence of coronary angioplasty implant and graft: Secondary | ICD-10-CM | POA: Diagnosis not present

## 2021-04-18 DIAGNOSIS — J45909 Unspecified asthma, uncomplicated: Secondary | ICD-10-CM | POA: Diagnosis not present

## 2021-04-18 DIAGNOSIS — I2510 Atherosclerotic heart disease of native coronary artery without angina pectoris: Secondary | ICD-10-CM | POA: Diagnosis not present

## 2021-04-22 ENCOUNTER — Other Ambulatory Visit (HOSPITAL_COMMUNITY): Payer: Self-pay | Admitting: Cardiology

## 2021-04-22 DIAGNOSIS — Z955 Presence of coronary angioplasty implant and graft: Secondary | ICD-10-CM | POA: Diagnosis not present

## 2021-04-22 DIAGNOSIS — I2510 Atherosclerotic heart disease of native coronary artery without angina pectoris: Secondary | ICD-10-CM | POA: Diagnosis not present

## 2021-04-23 DIAGNOSIS — I2510 Atherosclerotic heart disease of native coronary artery without angina pectoris: Secondary | ICD-10-CM | POA: Diagnosis not present

## 2021-04-23 DIAGNOSIS — Z955 Presence of coronary angioplasty implant and graft: Secondary | ICD-10-CM | POA: Diagnosis not present

## 2021-04-24 DIAGNOSIS — Z955 Presence of coronary angioplasty implant and graft: Secondary | ICD-10-CM | POA: Diagnosis not present

## 2021-04-24 DIAGNOSIS — I2510 Atherosclerotic heart disease of native coronary artery without angina pectoris: Secondary | ICD-10-CM | POA: Diagnosis not present

## 2021-04-25 DIAGNOSIS — I509 Heart failure, unspecified: Secondary | ICD-10-CM | POA: Diagnosis not present

## 2021-04-25 DIAGNOSIS — K219 Gastro-esophageal reflux disease without esophagitis: Secondary | ICD-10-CM | POA: Diagnosis not present

## 2021-04-25 DIAGNOSIS — E782 Mixed hyperlipidemia: Secondary | ICD-10-CM | POA: Diagnosis not present

## 2021-04-25 DIAGNOSIS — E559 Vitamin D deficiency, unspecified: Secondary | ICD-10-CM | POA: Diagnosis not present

## 2021-04-25 DIAGNOSIS — G43909 Migraine, unspecified, not intractable, without status migrainosus: Secondary | ICD-10-CM | POA: Diagnosis not present

## 2021-04-29 DIAGNOSIS — Z955 Presence of coronary angioplasty implant and graft: Secondary | ICD-10-CM | POA: Diagnosis not present

## 2021-04-29 DIAGNOSIS — G2581 Restless legs syndrome: Secondary | ICD-10-CM | POA: Diagnosis not present

## 2021-04-29 DIAGNOSIS — G4734 Idiopathic sleep related nonobstructive alveolar hypoventilation: Secondary | ICD-10-CM | POA: Diagnosis not present

## 2021-04-29 DIAGNOSIS — I2510 Atherosclerotic heart disease of native coronary artery without angina pectoris: Secondary | ICD-10-CM | POA: Diagnosis not present

## 2021-04-29 DIAGNOSIS — R5383 Other fatigue: Secondary | ICD-10-CM | POA: Diagnosis not present

## 2021-04-29 DIAGNOSIS — J4520 Mild intermittent asthma, uncomplicated: Secondary | ICD-10-CM | POA: Diagnosis not present

## 2021-05-01 DIAGNOSIS — Z955 Presence of coronary angioplasty implant and graft: Secondary | ICD-10-CM | POA: Diagnosis not present

## 2021-05-01 DIAGNOSIS — I2510 Atherosclerotic heart disease of native coronary artery without angina pectoris: Secondary | ICD-10-CM | POA: Diagnosis not present

## 2021-05-03 DIAGNOSIS — I2510 Atherosclerotic heart disease of native coronary artery without angina pectoris: Secondary | ICD-10-CM | POA: Diagnosis not present

## 2021-05-03 DIAGNOSIS — Z955 Presence of coronary angioplasty implant and graft: Secondary | ICD-10-CM | POA: Diagnosis not present

## 2021-05-05 ENCOUNTER — Encounter: Payer: Self-pay | Admitting: Cardiology

## 2021-05-05 DIAGNOSIS — I4901 Ventricular fibrillation: Secondary | ICD-10-CM

## 2021-05-06 ENCOUNTER — Ambulatory Visit (INDEPENDENT_AMBULATORY_CARE_PROVIDER_SITE_OTHER): Payer: Medicare Other

## 2021-05-06 DIAGNOSIS — Z955 Presence of coronary angioplasty implant and graft: Secondary | ICD-10-CM | POA: Diagnosis not present

## 2021-05-06 DIAGNOSIS — I2510 Atherosclerotic heart disease of native coronary artery without angina pectoris: Secondary | ICD-10-CM | POA: Diagnosis not present

## 2021-05-06 DIAGNOSIS — I4901 Ventricular fibrillation: Secondary | ICD-10-CM

## 2021-05-08 DIAGNOSIS — Z955 Presence of coronary angioplasty implant and graft: Secondary | ICD-10-CM | POA: Diagnosis not present

## 2021-05-08 DIAGNOSIS — I4901 Ventricular fibrillation: Secondary | ICD-10-CM | POA: Diagnosis not present

## 2021-05-08 DIAGNOSIS — I2510 Atherosclerotic heart disease of native coronary artery without angina pectoris: Secondary | ICD-10-CM | POA: Diagnosis not present

## 2021-05-09 DIAGNOSIS — I4901 Ventricular fibrillation: Secondary | ICD-10-CM | POA: Diagnosis not present

## 2021-05-10 DIAGNOSIS — I2510 Atherosclerotic heart disease of native coronary artery without angina pectoris: Secondary | ICD-10-CM | POA: Diagnosis not present

## 2021-05-10 DIAGNOSIS — Z955 Presence of coronary angioplasty implant and graft: Secondary | ICD-10-CM | POA: Diagnosis not present

## 2021-05-13 DIAGNOSIS — Z955 Presence of coronary angioplasty implant and graft: Secondary | ICD-10-CM | POA: Diagnosis not present

## 2021-05-13 DIAGNOSIS — I2510 Atherosclerotic heart disease of native coronary artery without angina pectoris: Secondary | ICD-10-CM | POA: Diagnosis not present

## 2021-05-14 DIAGNOSIS — Z79899 Other long term (current) drug therapy: Secondary | ICD-10-CM | POA: Diagnosis not present

## 2021-05-15 ENCOUNTER — Encounter: Payer: Self-pay | Admitting: Cardiology

## 2021-05-15 ENCOUNTER — Ambulatory Visit (INDEPENDENT_AMBULATORY_CARE_PROVIDER_SITE_OTHER): Payer: Medicare Other | Admitting: Cardiology

## 2021-05-15 VITALS — BP 100/58 | HR 58 | Ht 62.0 in | Wt 179.0 lb

## 2021-05-15 DIAGNOSIS — I255 Ischemic cardiomyopathy: Secondary | ICD-10-CM | POA: Diagnosis not present

## 2021-05-15 DIAGNOSIS — I2510 Atherosclerotic heart disease of native coronary artery without angina pectoris: Secondary | ICD-10-CM

## 2021-05-15 DIAGNOSIS — I4901 Ventricular fibrillation: Secondary | ICD-10-CM | POA: Diagnosis not present

## 2021-05-15 DIAGNOSIS — E7801 Familial hypercholesterolemia: Secondary | ICD-10-CM

## 2021-05-15 DIAGNOSIS — Z9861 Coronary angioplasty status: Secondary | ICD-10-CM | POA: Diagnosis not present

## 2021-05-15 DIAGNOSIS — I5022 Chronic systolic (congestive) heart failure: Secondary | ICD-10-CM | POA: Diagnosis not present

## 2021-05-15 DIAGNOSIS — Z955 Presence of coronary angioplasty implant and graft: Secondary | ICD-10-CM | POA: Diagnosis not present

## 2021-05-15 NOTE — Patient Instructions (Signed)
Medication Instructions:  ?Your physician recommends that you continue on your current medications as directed. Please refer to the Current Medication list given to you today.  ?*If you need a refill on your cardiac medications before your next appointment, please call your pharmacy* ? ? ?Lab Work: ?Your physician recommends that you have CMP, Lipids, ProBNP  ?If you have labs (blood work) drawn today and your tests are completely normal, you will receive your results only by: ?MyChart Message (if you have MyChart) OR ?A paper copy in the mail ?If you have any lab test that is abnormal or we need to change your treatment, we will call you to review the results. ? ? ?Testing/Procedures: ?Your physician has requested that you have an echocardiogram. Echocardiography is a painless test that uses sound waves to create images of your heart. It provides your doctor with information about the size and shape of your heart and how well your heart?s chambers and valves are working. This procedure takes approximately one hour. There are no restrictions for this procedure.  ? ? ?Follow-Up: ?At [facility], you and your health needs are our priority.  As part of our continuing mission to provide you with exceptional heart care, we have created designated Provider Care Teams.  These Care Teams include your primary Cardiologist (physician) and Advanced Practice Providers (APPs -  Physician Assistants and Nurse Practitioners) who all work together to provide you with the care you need, when you need it. ? ?We recommend signing up for the patient portal called "MyChart".  Sign up information is provided on this After Visit Summary.  MyChart is used to connect with patients for Virtual Visits (Telemedicine).  Patients are able to view lab/test results, encounter notes, upcoming appointments, etc.  Non-urgent messages can be sent to your provider as well.   ?To learn more about what you can do with MyChart, go to  [email or web address].   ? ?Your next appointment:   ?6 week(s) ? ?The format for your next appointment:   ?In Person ? ?Provider:   ?Norman Herrlich, MD  ? ? ?Other Instructions ?None ? ?Important Information About Sugar ? ? ? ? ? ? ?

## 2021-05-15 NOTE — Progress Notes (Signed)
?Cardiology Office Note:   ? ?Date:  05/15/2021  ? ?ID:  Maria Ellis, DOB [birth year], MRN [id number] ? ?PCP:  Eunice Blase, PA-C  ?Cardiologist:  Norman Herrlich, MD   ? ?Referring MD: Eunice Blase, PA-C  ? ? ?ASSESSMENT:   ? ?1. Chronic systolic heart failure (HCC)   ?2. CAD S/P percutaneous coronary angioplasty   ?3. Ischemic cardiomyopathy   ?4. Familial hypercholesterolemia   ? ?PLAN:   ? ?In order of problems listed above: ? ?Overall she is doing much better heart failure is compensated no fluid overload and she will continue her good medical regimen including SGLT2 inhibitor spironolactone beta-blocker presently not on loop diuretic and unfortunately intolerant to minimum dose ARB.  Now more than 6 weeks remote from her initial presentation we will recheck her echocardiogram for decision making regarding ICD she will continue and complete cardiac rehabilitation. ?Stable CAD continue her dual antiplatelet therapy and current treatment.  Last ?Recheck echocardiogram we need to make a decision regarding ICD therapy ?Recheck lipids she is on combined Zetia and high intensity statin goal LDL less than 70 really ideally less than 55 ? ?Next appointment: 6 to 8 weeks ? ? ?Medication Adjustments/Labs and Tests Ordered: ?Current medicines are reviewed at length with the patient today.  Concerns regarding medicines are outlined above.  ?Orders Placed This Encounter  ?Procedures  ? Comprehensive metabolic panel  ? Lipid panel  ? Pro b natriuretic peptide (BNP)  ? ECHOCARDIOGRAM COMPLETE  ? ?No orders of the defined types were placed in this encounter. ? ? ?Chief Complaint  ?Patient presents with  ? Follow-up  ? Congestive Heart Failure  ? Coronary Artery Disease  ? ? ?History of Present Illness:   ? ?Maria Ellis is a 36 y.o. female with a hx of heart coronary artery disease ischemic cardiomyopathy systolic heart failure and familial hyperlipidemia.  Other medical problems include hydrocephalus and Chiari malformation.   She had out of hospital VF arrest with successful resuscitation urgent catheterization PCI and stent [facility] 03/24/2021. She was recently discharged [facility] admit 03/20/2021 discharge 03/24/2021 she presented to the hospital with ventricular fibrillation and anterior ST segment elevation MI.  She underwent left heart catheterization showing severe single-vessel CAD with 100% occlusion of the left anterior descending in the midportion after the first diagonal branch and had successful PCI and drug-eluting stent to the LAD subsequently EF was estimated 40% and elevated left ventricular filling pressure EDP 28 to 30 mmHg.  She was treated with IV diuretic for her heart failure.  Subsequent echocardiogram confirmed LV dysfunction EF 35 to 50% with normal right ventricular function and no significant valvular abnormality. ?  ?She subsequently phoned triage 03/28/2020 with complaints of hypotension and chest pain and was promptly referred back to the emergency room for evaluation.  ED showed blood pressure 100/58 pulse 58 mm per mercury her EKG showed sinus rhythm age-indeterminate myocardial infarction no acute ST segment elevation she was felt to be stable and discharged home care.  Laboratory studies showed a creatinine 0.67 GFR greater than 60 cc potassium is diminished 3.2 hemoglobin 12.6. ? ?She was Last seen 04/03/2021 after an episode of hypotension and had a reduction in her spironolactone to ER ability to be dosage. ?. ?Compliance with diet, lifestyle and medications: Yes ? ?She is doing much better and really has benefit from the cardiac rehab program ?She could not tolerate minimum dose of losartan with symptomatic hypotension ?She does not have edema shortness  of breath orthopnea chest pain palpitation or syncope. ?No concerning arrhythmia monitoring cardiac rehab ?She does find her self short of breath with vigorous activity but she is slowly getting better with her exercise  program ?Past Medical History:  ?Diagnosis Date  ? Bell's palsy   ? Headache   ? History of Chiari malformation   ? Hydrocephalus (HCC)   ? Myocardial infarct [facility](HCC)   ? ? ?Past Surgical History:  ?Procedure Laterality Date  ? BRAIN SURGERY    ? CORONARY ANGIOPLASTY WITH STENT PLACEMENT    ? CORONARY/GRAFT ACUTE MI REVASCULARIZATION N/A 03/20/2021  ? Procedure: Coronary/Graft Acute MI Revascularization;  Surgeon: Marykay LexHarding, David W, MD;  [facility];  Service: Cardiovascular;  Laterality: N/A;  ? LAPAROSCOPIC APPENDECTOMY Left 05/18/2013  ? Procedure: APPENDECTOMY LAPAROSCOPIC;  Surgeon: Velora Hecklerodd M Gerkin, MD;  Location: [facility] ORS;  Service: General;  Laterality: Left;  ? LEFT HEART CATH AND CORONARY ANGIOGRAPHY N/A 03/20/2021  ? Procedure: LEFT HEART CATH AND CORONARY ANGIOGRAPHY;  Surgeon: Marykay LexHarding, David W, MD;  [facility];  Service: Cardiovascular;  Laterality: N/A;  ? MULTIPLE TOOTH EXTRACTIONS    ? SHUNT REPLACEMENT    ? x 2  ? TONSILECTOMY, ADENOIDECTOMY, BILATERAL MYRINGOTOMY AND TUBES    ? ? ?Current Medications: ?Current Meds  ?Medication Sig  ? AIMOVIG 70 MG/ML SOAJ Apply 70 mg topically every 30 (thirty) days.  ? aspirin 81 MG chewable tablet Chew 1 tablet (81 mg total) by mouth daily.  ? citalopram (CELEXA) 20 MG tablet Take 20 mg by mouth at bedtime.  ? dapagliflozin propanediol (FARXIGA) 10 MG TABS tablet Take 1 tablet (10 mg total) by mouth daily.  ? ezetimibe (ZETIA) 10 MG tablet Take 1 tablet (10 mg total) by mouth daily.  ? famotidine (PEPCID) 20 MG tablet Take 20 mg by mouth at bedtime.  ? fluticasone (FLONASE) 50 MCG/ACT nasal spray Place 1 spray into both nostrils in the morning and at bedtime.  ? metoprolol succinate (TOPROL-XL) 25 MG 24 hr tablet Take 0.5 tablets (12.5 mg total) by mouth daily.  ? nitroGLYCERIN (NITROSTAT) 0.4 MG SL tablet Place 1 tablet (0.4 mg total) under the tongue every 5 (five) minutes as needed for chest pain.  ? olopatadine (PATANOL) 0.1 %  ophthalmic solution 1 drop 2 (two) times daily as needed for allergies.  ? ondansetron (ZOFRAN-ODT) 4 MG disintegrating tablet Take 4 mg by mouth 2 (two) times daily as needed for nausea/vomiting.  ? PROAIR HFA 108 (90 Base) MCG/ACT inhaler Inhale 2 puffs into the lungs every 4 (four) hours as needed for wheezing or shortness of breath.   ? promethazine (PHENERGAN) 25 MG tablet Take 25 mg by mouth every 6 (six) hours as needed for nausea.   ? rizatriptan (MAXALT-MLT) 10 MG disintegrating tablet Take 1 tablet (10 mg total) by mouth as needed. May repeat in 2 hours if needed (Patient taking differently: Take 10 mg by mouth as needed for migraine. May repeat in 2 hours if needed)  ? rosuvastatin (CRESTOR) 40 MG tablet Take 1 tablet (40 mg total) by mouth daily.  ? spironolactone (ALDACTONE) 25 MG tablet Take 12.5 mg by mouth 3 (three) times a week. On Monday, Wednesday and Friday  ? SUBOXONE 8-2 MG FILM Place 1 strip under the tongue daily as needed (withdrawal).  ? ticagrelor (BRILINTA) 90 MG TABS tablet Take 1 tablet (90 mg total) by mouth 2 (two) times daily.  ? topiramate (TOPAMAX) 50 MG tablet Take  50 mg by mouth daily.  ? Vitamin D, Ergocalciferol, (DRISDOL) 1.25 MG (50000 UNIT) CAPS capsule Take 50,000 Units by mouth every Sunday.  ?  ? ?Allergies:   Penicillins, Iodinated contrast media, Multihance [gadobenate], and Tylenol [acetaminophen]  ? ?Social History  ? ?Socioeconomic History  ? Marital status: Single  ?  Spouse name: Not on file  ? Number of children: 0  ? Years of education: 63  ? Highest education level: Not on file  ?Occupational History  ? Occupation: Disabled  ?Tobacco Use  ? Smoking status: Former  ?  Types: Cigarettes  ? Smokeless tobacco: Never  ? Tobacco comments:  ?  Trying to quit - smoking about 2 cigarettes per week.  ?Vaping Use  ? Vaping Use: Never used  ?Substance and Sexual Activity  ? Alcohol use: No  ?  Alcohol/week: 0.0 standard drinks  ? Drug use: No  ? Sexual activity: Not on file   ?Other Topics Concern  ? Not on file  ?Social History Narrative  ? Lives at home with her mother.  ? Left-handed.  ? 8-9 cups caffeine per day.  ? ?Social Determinants of Health  ? ?Financial Resource Strain: Lo

## 2021-05-16 LAB — COMPREHENSIVE METABOLIC PANEL
ALT: 27 IU/L (ref 0–32)
AST: 18 IU/L (ref 0–40)
Albumin/Globulin Ratio: 2.4 — ABNORMAL HIGH (ref 1.2–2.2)
Albumin: 4.6 g/dL (ref 3.8–4.8)
Alkaline Phosphatase: 108 IU/L (ref 44–121)
BUN/Creatinine Ratio: 13 (ref 9–23)
BUN: 10 mg/dL (ref 6–20)
Bilirubin Total: 0.3 mg/dL (ref 0.0–1.2)
CO2: 18 mmol/L — ABNORMAL LOW (ref 20–29)
Calcium: 9.1 mg/dL (ref 8.7–10.2)
Chloride: 106 mmol/L (ref 96–106)
Creatinine, Ser: 0.79 mg/dL (ref 0.57–1.00)
Globulin, Total: 1.9 g/dL (ref 1.5–4.5)
Glucose: 94 mg/dL (ref 70–99)
Potassium: 3.9 mmol/L (ref 3.5–5.2)
Sodium: 139 mmol/L (ref 134–144)
Total Protein: 6.5 g/dL (ref 6.0–8.5)
eGFR: 100 mL/min/{1.73_m2} (ref >59)

## 2021-05-16 LAB — LIPID PANEL
Chol/HDL Ratio: 2.2 ratio (ref 0.0–4.4)
Cholesterol, Total: 104 mg/dL (ref 100–199)
HDL: 47 mg/dL (ref >39)
LDL Chol Calc (NIH): 36 mg/dL (ref 0–99)
Triglycerides: 118 mg/dL (ref 0–149)
VLDL Cholesterol Cal: 21 mg/dL (ref 5–40)

## 2021-05-16 LAB — PRO B NATRIURETIC PEPTIDE: NT-Pro BNP: 352 pg/mL — ABNORMAL HIGH (ref 0–130)

## 2021-05-17 DIAGNOSIS — Z955 Presence of coronary angioplasty implant and graft: Secondary | ICD-10-CM | POA: Diagnosis not present

## 2021-05-17 DIAGNOSIS — I2510 Atherosclerotic heart disease of native coronary artery without angina pectoris: Secondary | ICD-10-CM | POA: Diagnosis not present

## 2021-05-19 DIAGNOSIS — J45909 Unspecified asthma, uncomplicated: Secondary | ICD-10-CM | POA: Diagnosis not present

## 2021-05-20 DIAGNOSIS — Z955 Presence of coronary angioplasty implant and graft: Secondary | ICD-10-CM | POA: Diagnosis not present

## 2021-05-20 DIAGNOSIS — I2510 Atherosclerotic heart disease of native coronary artery without angina pectoris: Secondary | ICD-10-CM | POA: Diagnosis not present

## 2021-05-21 ENCOUNTER — Encounter: Payer: Self-pay | Admitting: Cardiology

## 2021-05-22 ENCOUNTER — Other Ambulatory Visit: Payer: Self-pay

## 2021-05-22 ENCOUNTER — Encounter (HOSPITAL_BASED_OUTPATIENT_CLINIC_OR_DEPARTMENT_OTHER): Payer: Self-pay

## 2021-05-22 ENCOUNTER — Telehealth: Payer: Self-pay

## 2021-05-22 ENCOUNTER — Emergency Department (HOSPITAL_BASED_OUTPATIENT_CLINIC_OR_DEPARTMENT_OTHER)
Admission: EM | Admit: 2021-05-22 | Discharge: 2021-05-22 | Disposition: A | Discharge status: Home / Self Care | Payer: Medicare Other | Attending: Emergency Medicine | Admitting: Emergency Medicine

## 2021-05-22 DIAGNOSIS — Z7982 Long term (current) use of aspirin: Secondary | ICD-10-CM | POA: Diagnosis not present

## 2021-05-22 DIAGNOSIS — I2510 Atherosclerotic heart disease of native coronary artery without angina pectoris: Secondary | ICD-10-CM | POA: Diagnosis not present

## 2021-05-22 DIAGNOSIS — Z955 Presence of coronary angioplasty implant and graft: Secondary | ICD-10-CM | POA: Diagnosis not present

## 2021-05-22 DIAGNOSIS — K625 Hemorrhage of anus and rectum: Secondary | ICD-10-CM | POA: Diagnosis not present

## 2021-05-22 DIAGNOSIS — E876 Hypokalemia: Secondary | ICD-10-CM | POA: Diagnosis not present

## 2021-05-22 HISTORY — DX: Pure hypercholesterolemia, unspecified: E78.00

## 2021-05-22 LAB — BASIC METABOLIC PANEL
Anion gap: 7 (ref 5–15)
BUN: 12 mg/dL (ref 6–20)
CO2: 22 mmol/L (ref 22–32)
Calcium: 8.8 mg/dL — ABNORMAL LOW (ref 8.9–10.3)
Chloride: 109 mmol/L (ref 98–111)
Creatinine, Ser: 0.8 mg/dL (ref 0.44–1.00)
GFR, Estimated: >60 mL/min (ref >60)
Glucose, Bld: 80 mg/dL (ref 70–99)
Potassium: 3.4 mmol/L — ABNORMAL LOW (ref 3.5–5.1)
Sodium: 138 mmol/L (ref 135–145)

## 2021-05-22 LAB — CBC WITH DIFFERENTIAL/PLATELET
Abs Immature Granulocytes: 0.02 10*3/uL (ref 0.00–0.07)
Basophils Absolute: 0.1 10*3/uL (ref 0.0–0.1)
Basophils Relative: 1 %
Eosinophils Absolute: 0.3 10*3/uL (ref 0.0–0.5)
Eosinophils Relative: 3 %
HCT: 39.8 % (ref 36.0–46.0)
Hemoglobin: 13.4 g/dL (ref 12.0–15.0)
Immature Granulocytes: 0 %
Lymphocytes Relative: 40 %
Lymphs Abs: 3.9 10*3/uL (ref 0.7–4.0)
MCH: 28.6 pg (ref 26.0–34.0)
MCHC: 33.7 g/dL (ref 30.0–36.0)
MCV: 84.9 fL (ref 80.0–100.0)
Monocytes Absolute: 0.8 10*3/uL (ref 0.1–1.0)
Monocytes Relative: 8 %
Neutro Abs: 4.8 10*3/uL (ref 1.7–7.7)
Neutrophils Relative %: 48 %
Platelets: 281 10*3/uL (ref 150–400)
RBC: 4.69 MIL/uL (ref 3.87–5.11)
RDW: 12.6 % (ref 11.5–15.5)
WBC: 9.9 10*3/uL (ref 4.0–10.5)
nRBC: 0 % (ref 0.0–0.2)

## 2021-05-22 LAB — OCCULT BLOOD X 1 CARD TO LAB, STOOL: Fecal Occult Bld: POSITIVE — AB

## 2021-05-22 MED ORDER — POTASSIUM CHLORIDE CRYS ER 20 MEQ PO TBCR
40.0000 meq | EXTENDED_RELEASE_TABLET | Freq: Once | ORAL | Status: AC
  Administered 2021-05-22: 40 meq via ORAL
  Filled 2021-05-22: qty 2

## 2021-05-22 MED ORDER — OMEPRAZOLE 20 MG PO CPDR: 20.0000 mg | DELAYED_RELEASE_CAPSULE | Freq: Every day | ORAL | 0 refills | Status: DC

## 2021-05-22 NOTE — Telephone Encounter (Signed)
Patient sent a MyChart note explaining that she had been bleeding from the rectum and that she had recently started having nose bleeds. I forwarded the MyChart message to Dr. Dulce Sellar for his advice. Dr. Dulce Sellar ordered that she go to the ER due to her past  medical history and current bleeding. I called the patient and informed her of Dr. Hulen Shouts recommendation to go to the ER and she stated that she would. Patient had no further questions at this time. ?

## 2021-05-22 NOTE — Discharge Instructions (Addendum)
As we discussed, your work-up in the ER today was reassuring for acute abnormalities.  You will need to follow-up with gastroenterology for further evaluation and management of your symptoms.  I have given you a referral with a number to call for same.  Additionally, I have given you a prescription for a medication to help with your symptoms.  Please take this as prescribed until you are able to follow-up with GI. ? ?Return if development of any new or worsening symptoms. ?

## 2021-05-22 NOTE — ED Triage Notes (Signed)
Pt c/o rectal bleeding x2 months that started when she had constipation. Pt reports there is blood when she wipes and on the outside of her stool.  ?

## 2021-05-22 NOTE — ED Notes (Signed)
Pt A&OX4 ambulatory at d/c with independent steady gait. Pt verbalized understanding of d/c instructions, prescription and follow up care. 

## 2021-05-22 NOTE — ED Notes (Signed)
Chaperoned provider's rectal exam ?

## 2021-05-22 NOTE — ED Provider Notes (Signed)
?[facility] EMERGENCY DEPARTMENT ?Provider Note ? ? ?CSN: [id number] ?Arrival date & time: 05/22/21  1215 ? ?  ? ?History ? ?Chief Complaint  ?Patient presents with  ? Rectal Bleeding  ? ? ?Maria Ellis is a 36 y.o. female. ? ?Patient with a history of Bell's palsy, Chiari malformation, hydrocephalus, recent STEMI with V-fib arrest on 03/20/2021 and discharge on 03/24/21 on Ticagrelor presents today with complaints of rectal bleeding. States that same began when she was started on Ticagrelor following her STEMI in March, she was seen for same by her PCP and discharged with hemorrhoid cream.  She states that same has been worsening recently with visualization of clots when she has bowel movements.  She denies any abdominal pain, rectal pain, or pain with bowel movements.  She denies any lightheadedness, dizziness, or shortness of breath. ? ?The history is provided by the patient. No language interpreter was used.  ?Rectal Bleeding ?Associated symptoms: no fever   ? ?  ? ?Home Medications ?Prior to Admission medications   ?Medication Sig Start Date End Date Taking? Authorizing Provider  ?AIMOVIG 70 MG/ML SOAJ Apply 70 mg topically every 30 (thirty) days. 01/03/21   [provider]  ?aspirin 81 MG chewable tablet Chew 1 tablet (81 mg total) by mouth daily. 03/25/21   Azalee Course, PA  ?citalopram (CELEXA) 20 MG tablet Take 20 mg by mouth at bedtime. 01/23/21   [provider]  ?dapagliflozin propanediol (FARXIGA) 10 MG TABS tablet Take 1 tablet (10 mg total) by mouth daily. 03/25/21   Azalee Course, PA  ?ezetimibe (ZETIA) 10 MG tablet Take 1 tablet (10 mg total) by mouth daily. 03/25/21   Azalee Course, PA  ?famotidine (PEPCID) 20 MG tablet Take 20 mg by mouth at bedtime. 10/18/20   [provider]  ?fluticasone (FLONASE) 50 MCG/ACT nasal spray Place 1 spray into both nostrils in the morning and at bedtime. 03/12/16   [provider]  ?losartan (COZAAR) 25 MG tablet Take 12.5 mg by mouth every other  day. ?Patient not taking: Reported on 05/15/2021    [provider]  ?metoprolol succinate (TOPROL-XL) 25 MG 24 hr tablet Take 0.5 tablets (12.5 mg total) by mouth daily. 03/24/21   Azalee Course, PA  ?nitroGLYCERIN (NITROSTAT) 0.4 MG SL tablet Place 1 tablet (0.4 mg total) under the tongue every 5 (five) minutes as needed for chest pain. 03/24/21   Azalee Course, PA  ?olopatadine (PATANOL) 0.1 % ophthalmic solution 1 drop 2 (two) times daily as needed for allergies.    [provider]  ?ondansetron (ZOFRAN-ODT) 4 MG disintegrating tablet Take 4 mg by mouth 2 (two) times daily as needed for nausea/vomiting. 02/24/21   [provider]  ?PROAIR HFA 108 (90 Base) MCG/ACT inhaler Inhale 2 puffs into the lungs every 4 (four) hours as needed for wheezing or shortness of breath.  04/11/16   [provider]  ?promethazine (PHENERGAN) 25 MG tablet Take 25 mg by mouth every 6 (six) hours as needed for nausea.  03/12/16   [provider]  ?rizatriptan (MAXALT-MLT) 10 MG disintegrating tablet Take 1 tablet (10 mg total) by mouth as needed. May repeat in 2 hours if needed ?Patient taking differently: Take 10 mg by mouth as needed for migraine. May repeat in 2 hours if needed 04/06/17   Nilda Riggs, NP  ?rosuvastatin (CRESTOR) 40 MG tablet Take 1 tablet (40 mg total) by mouth daily. 03/25/21   Azalee Course, PA  ?spironolactone (ALDACTONE) 25  MG tablet Take 12.5 mg by mouth 3 (three) times a week. On Monday, Wednesday and Friday    [provider]  ?SUBOXONE 8-2 MG FILM Place 1 strip under the tongue daily as needed (withdrawal). 03/06/21   [provider]  ?ticagrelor (BRILINTA) 90 MG TABS tablet Take 1 tablet (90 mg total) by mouth 2 (two) times daily. 03/24/21   Azalee Course, PA  ?topiramate (TOPAMAX) 50 MG tablet Take 50 mg by mouth daily. 04/11/16   [provider]  ?Vitamin D, Ergocalciferol, (DRISDOL) 1.25 MG (50000 UNIT) CAPS capsule Take 50,000 Units by mouth every  Sunday.    [provider]  ?   ? ?Allergies    ?Penicillins, Iodinated contrast media, Multihance [gadobenate], and Tylenol [acetaminophen]   ? ?Review of Systems   ?Review of Systems  ?Constitutional:  Negative for chills and fever.  ?Gastrointestinal:  Positive for blood in stool and hematochezia.  ?All other systems reviewed and are negative. ? ?Physical Exam ?Updated Vital Signs ?BP 94/63 (BP Location: Left Arm)   Pulse (!) 58   Temp 99.1 ?F (37.3 ?C) (Oral)   Resp 20   Ht 5\' 3"  (1.6 m)   Wt 75.8 kg   LMP 03/21/2021   SpO2 98%   BMI 29.58 kg/m?  ?Physical Exam ?Vitals and nursing note reviewed. Exam conducted with a chaperone present.  ?Constitutional:   ?   General: She is not in acute distress. ?   Appearance: Normal appearance. She is normal weight. She is not ill-appearing, toxic-appearing or diaphoretic.  ?HENT:  ?   Head: Normocephalic and atraumatic.  ?Cardiovascular:  ?   Rate and Rhythm: Normal rate.  ?Pulmonary:  ?   Effort: Pulmonary effort is normal. No respiratory distress.  ?Abdominal:  ?   General: Abdomen is flat.  ?   Palpations: Abdomen is soft.  ?   Tenderness: There is no abdominal tenderness.  ?Genitourinary: ?   Comments: No hemorrhoids or fissure present. No visible blood or stool present in the rectal vault. ?Musculoskeletal:     ?   General: Normal range of motion.  ?   Cervical back: Normal range of motion.  ?Skin: ?   General: Skin is warm and dry.  ?Neurological:  ?   General: No focal deficit present.  ?   Mental Status: She is alert.  ?Psychiatric:     ?   Mood and Affect: Mood normal.     ?   Behavior: Behavior normal.  ? ? ?ED Results / Procedures / Treatments   ?Labs ?(all labs ordered are listed, but only abnormal results are displayed) ?Labs Reviewed  ?BASIC METABOLIC PANEL - Abnormal; Notable for the following components:  ?    Result Value  ? Potassium 3.4 (*)   ? Calcium 8.8 (*)   ? All other components within normal limits  ?OCCULT BLOOD X 1 CARD TO LAB,  STOOL - Abnormal; Notable for the following components:  ? Fecal Occult Bld POSITIVE (*)   ? All other components within normal limits  ?CBC WITH DIFFERENTIAL/PLATELET  ?POC OCCULT BLOOD, ED  ? ? ?EKG ?None ? ?Radiology ?No results found. ? ?Procedures ?Procedures  ? ? ?Medications Ordered in ED ?Medications - No data to display ? ?ED Course/ Medical Decision Making/ A&P ?  ?                        ?Medical Decision Making ?Amount and/or Complexity of Data  Reviewed ?Labs: ordered. ? ?Risk ?Prescription drug management. ? ? ?This patient presents to the ED for concern of rectal bleeding, this involves an extensive number of treatment options, and is a complaint that carries with it a high risk of complications and morbidity.  ? ? ?Co morbidities that complicate the patient evaluation ? ?Hx STEMI in March on Ticagrelor ? ? ?Additional history obtained: ? ?Additional history obtained from previous ER notes ? ? ?Lab Tests: ? ?I Ordered, and personally interpreted labs.  The pertinent results include:  Hemoccult positive, no leukocytosis or anemia, K 3.4 ? ? ?Problem List / ED Course / Critical interventions / Medication management ? ?I ordered medication including oral potassium  for hypokalemia  ?Reevaluation of the patient after these medicines showed that the patient stayed the same ?I have reviewed the patients home medicines and have made adjustments as needed ? ?Patient presents today with painless rectal bleeding x 2 months.  He is afebrile, nontoxic-appearing, and in no acute distress.  Abdomen is soft and nontender.  No blood visualized in rectum on exam, no concerns for acute hemorrhage at this time.  Hemoglobin is stable.  No further emergent concerns at this time, will discharge with close GI follow-up. Will also give PPI prescription. Patient is understanding and amenable with plan, educated on red flag symptoms of prompt immediate return.  Discharged in stable condition. ? ?Findings and plan of care  discussed with supervising physician Dr. Wallace CullensGray who is in agreement.  ? ? ?Final Clinical Impression(s) / ED Diagnoses ?Final diagnoses:  ?Rectal bleeding  ? ? ?Rx / DC Orders ?ED Discharge Orders   ? ?      Ordered  ?  o

## 2021-05-23 ENCOUNTER — Ambulatory Visit (INDEPENDENT_AMBULATORY_CARE_PROVIDER_SITE_OTHER): Payer: Medicare Other

## 2021-05-23 DIAGNOSIS — I5022 Chronic systolic (congestive) heart failure: Secondary | ICD-10-CM | POA: Diagnosis not present

## 2021-05-23 LAB — ECHOCARDIOGRAM COMPLETE
Area-P 1/2: 3.99 cm2
S' Lateral: 4.9 cm
Single Plane A4C EF: 44.8 %

## 2021-05-24 DIAGNOSIS — I2510 Atherosclerotic heart disease of native coronary artery without angina pectoris: Secondary | ICD-10-CM | POA: Diagnosis not present

## 2021-05-24 DIAGNOSIS — Z955 Presence of coronary angioplasty implant and graft: Secondary | ICD-10-CM | POA: Diagnosis not present

## 2021-05-27 DIAGNOSIS — I2510 Atherosclerotic heart disease of native coronary artery without angina pectoris: Secondary | ICD-10-CM | POA: Diagnosis not present

## 2021-05-27 DIAGNOSIS — Z955 Presence of coronary angioplasty implant and graft: Secondary | ICD-10-CM | POA: Diagnosis not present

## 2021-05-29 DIAGNOSIS — I2510 Atherosclerotic heart disease of native coronary artery without angina pectoris: Secondary | ICD-10-CM | POA: Diagnosis not present

## 2021-05-29 DIAGNOSIS — Z955 Presence of coronary angioplasty implant and graft: Secondary | ICD-10-CM | POA: Diagnosis not present

## 2021-05-30 DIAGNOSIS — K921 Melena: Secondary | ICD-10-CM | POA: Diagnosis not present

## 2021-05-30 DIAGNOSIS — Z1212 Encounter for screening for malignant neoplasm of rectum: Secondary | ICD-10-CM | POA: Diagnosis not present

## 2021-05-31 ENCOUNTER — Telehealth: Payer: Self-pay

## 2021-05-31 DIAGNOSIS — K921 Melena: Secondary | ICD-10-CM | POA: Diagnosis not present

## 2021-05-31 DIAGNOSIS — Z955 Presence of coronary angioplasty implant and graft: Secondary | ICD-10-CM | POA: Diagnosis not present

## 2021-05-31 DIAGNOSIS — I2510 Atherosclerotic heart disease of native coronary artery without angina pectoris: Secondary | ICD-10-CM | POA: Diagnosis not present

## 2021-05-31 NOTE — Telephone Encounter (Signed)
Called patient to inform her of the results of her echocardiogram and to schedule her Definity contrast. Patient asked if the Definity contrast was the same contrast that they had given her when she had a CT scan because she had an allergic reaction, her body felt like it was burning all over, when they gave her the CT contrast.I will send this question to Dr. Bing Matter who is covering for Dr. Dulce Sellar. ?

## 2021-06-01 DIAGNOSIS — K921 Melena: Secondary | ICD-10-CM | POA: Diagnosis not present

## 2021-06-03 ENCOUNTER — Other Ambulatory Visit: Payer: Self-pay

## 2021-06-03 DIAGNOSIS — Z955 Presence of coronary angioplasty implant and graft: Secondary | ICD-10-CM | POA: Diagnosis not present

## 2021-06-03 DIAGNOSIS — I5022 Chronic systolic (congestive) heart failure: Secondary | ICD-10-CM

## 2021-06-03 DIAGNOSIS — I2510 Atherosclerotic heart disease of native coronary artery without angina pectoris: Secondary | ICD-10-CM | POA: Diagnosis not present

## 2021-06-04 ENCOUNTER — Telehealth: Payer: Self-pay | Admitting: *Deleted

## 2021-06-04 NOTE — Telephone Encounter (Signed)
-----   Message from Baldo Daub, MD sent at 06/04/2021  8:15 AM EDT ----- ?Good results no concerning arrhythmia. ?

## 2021-06-04 NOTE — Telephone Encounter (Signed)
Let pt know that heart monitor result was good, no arrhythmias. Pt verbalized understanding ?

## 2021-06-05 DIAGNOSIS — Z955 Presence of coronary angioplasty implant and graft: Secondary | ICD-10-CM | POA: Diagnosis not present

## 2021-06-05 DIAGNOSIS — I2510 Atherosclerotic heart disease of native coronary artery without angina pectoris: Secondary | ICD-10-CM | POA: Diagnosis not present

## 2021-06-07 DIAGNOSIS — I2510 Atherosclerotic heart disease of native coronary artery without angina pectoris: Secondary | ICD-10-CM | POA: Diagnosis not present

## 2021-06-07 DIAGNOSIS — H6692 Otitis media, unspecified, left ear: Secondary | ICD-10-CM | POA: Diagnosis not present

## 2021-06-07 DIAGNOSIS — Z955 Presence of coronary angioplasty implant and graft: Secondary | ICD-10-CM | POA: Diagnosis not present

## 2021-06-10 DIAGNOSIS — I2510 Atherosclerotic heart disease of native coronary artery without angina pectoris: Secondary | ICD-10-CM | POA: Diagnosis not present

## 2021-06-10 DIAGNOSIS — Z955 Presence of coronary angioplasty implant and graft: Secondary | ICD-10-CM | POA: Diagnosis not present

## 2021-06-11 DIAGNOSIS — Z79899 Other long term (current) drug therapy: Secondary | ICD-10-CM | POA: Diagnosis not present

## 2021-06-12 DIAGNOSIS — Z955 Presence of coronary angioplasty implant and graft: Secondary | ICD-10-CM | POA: Diagnosis not present

## 2021-06-12 DIAGNOSIS — H2513 Age-related nuclear cataract, bilateral: Secondary | ICD-10-CM | POA: Diagnosis not present

## 2021-06-12 DIAGNOSIS — I2510 Atherosclerotic heart disease of native coronary artery without angina pectoris: Secondary | ICD-10-CM | POA: Diagnosis not present

## 2021-06-14 ENCOUNTER — Ambulatory Visit (INDEPENDENT_AMBULATORY_CARE_PROVIDER_SITE_OTHER): Payer: Medicare Other

## 2021-06-14 ENCOUNTER — Other Ambulatory Visit: Payer: Self-pay | Admitting: Cardiology

## 2021-06-14 DIAGNOSIS — Z955 Presence of coronary angioplasty implant and graft: Secondary | ICD-10-CM | POA: Diagnosis not present

## 2021-06-14 DIAGNOSIS — I5022 Chronic systolic (congestive) heart failure: Secondary | ICD-10-CM

## 2021-06-14 DIAGNOSIS — I2510 Atherosclerotic heart disease of native coronary artery without angina pectoris: Secondary | ICD-10-CM | POA: Diagnosis not present

## 2021-06-14 LAB — ECHOCARDIOGRAM LIMITED
Area-P 1/2: 5.54 cm2
S' Lateral: 4.8 cm

## 2021-06-14 MED ORDER — PERFLUTREN LIPID MICROSPHERE
1.0000 mL | INTRAVENOUS | Status: AC | PRN
  Administered 2021-06-14: 8 mL via INTRAVENOUS

## 2021-06-18 DIAGNOSIS — J45909 Unspecified asthma, uncomplicated: Secondary | ICD-10-CM | POA: Diagnosis not present

## 2021-06-19 DIAGNOSIS — Z955 Presence of coronary angioplasty implant and graft: Secondary | ICD-10-CM | POA: Diagnosis not present

## 2021-06-19 DIAGNOSIS — I2510 Atherosclerotic heart disease of native coronary artery without angina pectoris: Secondary | ICD-10-CM | POA: Diagnosis not present

## 2021-06-21 DIAGNOSIS — Z955 Presence of coronary angioplasty implant and graft: Secondary | ICD-10-CM | POA: Diagnosis not present

## 2021-06-21 DIAGNOSIS — I2510 Atherosclerotic heart disease of native coronary artery without angina pectoris: Secondary | ICD-10-CM | POA: Diagnosis not present

## 2021-06-24 DIAGNOSIS — I2510 Atherosclerotic heart disease of native coronary artery without angina pectoris: Secondary | ICD-10-CM | POA: Diagnosis not present

## 2021-06-24 DIAGNOSIS — Z955 Presence of coronary angioplasty implant and graft: Secondary | ICD-10-CM | POA: Diagnosis not present

## 2021-06-26 DIAGNOSIS — E7800 Pure hypercholesterolemia, unspecified: Secondary | ICD-10-CM | POA: Insufficient documentation

## 2021-06-26 NOTE — Progress Notes (Signed)
Cardiology Office Note:    Date:  06/27/2021   ID:  Maria Ellis, DOB [birth year], MRN [id number]  PCP:  Eunice Blase'Buch, Greta, PA-C  Cardiologist:  Norman HerrlichBrian Shalynn Jorstad, MD    Referring MD: Eunice Blase'Buch, Greta, PA-C    ASSESSMENT:    1. CAD S/P percutaneous coronary angioplasty   2. Ischemic cardiomyopathy   3. Chronic systolic heart failure (HCC)   4. Familial hypercholesterolemia    PLAN:    In order of problems listed above:  Continues to do well with CAD following hospital VF arrest ST elevation MI with PCI and stent March 2023 we will continue her on her directed dual antiplatelet therapy for 1 year and surgery continuing outpatient cardiac. Stable improvement of recent EF 40 to 45% Stable compensated currently not on loop diuretic continue guideline directed therapy beta-blocker SGLT2 inhibitor spironolactone and is not on ACE or ARB or Entresto with previous symptomatic hypotension Lipids are ideal continue high intensity statin and Zetia   Next appointment: 3 months   Medication Adjustments/Labs and Tests Ordered: Current medicines are reviewed at length with the patient today.  Concerns regarding medicines are outlined above.  No orders of the defined types were placed in this encounter.  No orders of the defined types were placed in this encounter.   Chief Complaint  Patient presents with   Coronary Artery Disease   Congestive Heart Failure    History of Present Illness:    Maria Ellis is a 36 y.o. female with a hx of coronary artery disease ischemic cardiomyopathy systolic heart failure and familial hyperlipidemia. Other medical problems include hydrocephalus and Chiari malformation. She had out of hospital VF arrest with successful resuscitation urgent catheterization PCI and [facility] 03/24/[facility]21.  He was last seen 05/15/2021 with marked clinical improvement on good guideline directed therapy including SGLT2 inhibitor spironolactone beta-blocker and low-dose  ARB.  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  She also had an event monitor 06/03/2021 showing rare ventricular ectopy no episodes of ventricular tachycardia. Past Medical History:  Diagnosis Date   Bell's palsy    Headache    High cholesterol    History of Chiari malformation    Hydrocephalus (HCC)    Myocardial infarct [facility](HCC)     Past Surgical History:  Procedure Laterality Date   BRAIN SURGERY     CORONARY ANGIOPLASTY WITH STENT PLACEMENT     CORONARY/GRAFT ACUTE MI REVASCULARIZATION N/A 03/20/2021   Procedure: Coronary/Graft Acute MI Revascularization;  Surgeon: Marykay LexHarding, David W, MD;  [facility];  Service: Cardiovascular;  Laterality: N/A;   LAPAROSCOPIC APPENDECTOMY Left 05/18/2013   Procedure: APPENDECTOMY LAPAROSCOPIC;  Surgeon: Velora Hecklerodd M Gerkin, MD;  Location: [facility] ORS;  Service: General;  Laterality: Left;   LEFT HEART CATH AND CORONARY ANGIOGRAPHY N/A 03/20/2021   Procedure: LEFT HEART CATH AND CORONARY ANGIOGRAPHY;  Surgeon:  Marykay Lex, MD;  Location: [facility] INVASIVE CV LAB;  Service: Cardiovascular;  Laterality: N/A;   MULTIPLE TOOTH EXTRACTIONS     SHUNT REPLACEMENT     x 2   TONSILECTOMY, ADENOIDECTOMY, BILATERAL MYRINGOTOMY AND TUBES      Current Medications: Current Meds  Medication Sig    AIMOVIG 70 MG/ML SOAJ Apply 70 mg topically every 30 (thirty) days.   aspirin 81 MG chewable tablet Chew 1 tablet (81 mg total) by mouth daily.   citalopram (CELEXA) 40 MG tablet Take 40 mg by mouth daily.   dapagliflozin propanediol (FARXIGA) 10 MG TABS tablet Take 1 tablet (10 mg total) by mouth daily.   ezetimibe (ZETIA) 10 MG tablet Take 1 tablet (10 mg total) by mouth daily.   famotidine (PEPCID) 20 MG tablet Take 20 mg by mouth at bedtime.   fluticasone (FLONASE) 50 MCG/ACT nasal spray Place 1 spray into both nostrils in the morning and at bedtime.   metoprolol succinate (TOPROL-XL) 25 MG 24 hr tablet Take 0.5 tablets (12.5 mg total) by mouth daily.   nitroGLYCERIN (NITROSTAT) 0.4 MG SL tablet Place 1 tablet (0.4 mg total) under the tongue every 5 (five) minutes as needed for chest pain.   olopatadine (PATANOL) 0.1 % ophthalmic solution 1 drop 2 (two) times daily as needed for allergies.   omeprazole (PRILOSEC) 20 MG capsule Take 1 capsule (20 mg total) by mouth daily.   ondansetron (ZOFRAN-ODT) 4 MG disintegrating tablet Take 4 mg by mouth 2 (two) times daily as needed for nausea/vomiting.   PROAIR HFA 108 (90 Base) MCG/ACT inhaler Inhale 2 puffs into the lungs every 4 (four) hours as needed for wheezing or shortness of breath.    promethazine (PHENERGAN) 25 MG tablet Take 25 mg by mouth every 6 (six) hours as needed for nausea.    rizatriptan (MAXALT-MLT) 10 MG disintegrating tablet Take 1 tablet (10 mg total) by mouth as needed. May repeat in 2 hours if needed (Patient taking differently: Take 10 mg by mouth as needed for migraine. May repeat in 2 hours if needed)   rosuvastatin (CRESTOR) 40 MG tablet Take 1 tablet (40 mg total) by mouth daily.   spironolactone (ALDACTONE) 25 MG tablet Take 12.5 mg by mouth 3 (three) times a week. On Monday, Wednesday and Friday   SUBOXONE 8-2 MG FILM Place 1 strip under the tongue daily as needed (withdrawal).   ticagrelor (BRILINTA) 90 MG TABS tablet  Take 1 tablet (90 mg total) by mouth 2 (two) times daily.   topiramate (TOPAMAX) 50 MG tablet Take 50 mg by mouth daily.   Vitamin D, Ergocalciferol, (DRISDOL) 1.25 MG (50000 UNIT) CAPS capsule Take 50,000 Units by mouth every Sunday.     Allergies:   Penicillins, Iodinated contrast media, Multihance [gadobenate], and Tylenol [acetaminophen]   Social History   Socioeconomic History   Marital status: Single    Spouse name: Not on file   Number of children: 0   Years of education: 12   Highest education level: Not on file  Occupational History   Occupation: Disabled  Tobacco Use   Smoking status: Former    Types: Cigarettes    Passive exposure: Past   Smokeless tobacco: Never   Tobacco comments:    Trying to quit - smoking about 2 cigarettes per week.  Vaping Use   Vaping Use: Never used  Substance and Sexual Activity   Alcohol use: Yes    Comment: occ   Drug use: No  Sexual activity: Yes    Birth control/protection: None  Other Topics Concern   Not on file  Social History Narrative   Lives at home with her mother.   Left-handed.   8-9 cups caffeine per day.   Social Determinants of Health   Financial Resource Strain: Low Risk  (04/01/2021)   Overall Financial Resource Strain (CARDIA)    Difficulty of Paying Living Expenses: Not very hard  Food Insecurity: No Food Insecurity (04/01/2021)   Hunger Vital Sign    Worried About Running Out of Food in the Last Year: Never true    Ran Out of Food in the Last Year: Never true  Transportation Needs: No Transportation Needs (04/01/2021)   PRAPARE - Administrator, Civil Service (Medical): No    Lack of Transportation (Non-Medical): No  Physical Activity: Not on file  Stress: Not on file  Social Connections: Not on file     Family History: The patient's family history includes Cirrhosis in her father; Diabetes in her father and mother; Heart failure in her father. ROS:   Please see the history of present  illness.    All other systems reviewed and are negative.  EKGs/Labs/Other Studies Reviewed:    The following studies were reviewed today:    Recent Labs: 03/21/2021: Magnesium 2.2 04/01/2021: B Natriuretic Peptide 117.2 05/15/2021: ALT 27; NT-Pro BNP 352 05/22/2021: BUN 12; Creatinine, Ser 0.80; Hemoglobin 13.4; Platelets 281; Potassium 3.4; Sodium 138  Recent Lipid Panel    Component Value Date/Time   CHOL 104 05/15/2021 1106   TRIG 118 05/15/2021 1106   HDL 47 05/15/2021 1106   CHOLHDL 2.2 05/15/2021 1106   CHOLHDL 4.4 03/20/2021 0526   VLDL 7 03/20/2021 0526   LDLCALC 36 05/15/2021 1106    Physical Exam:    VS:  BP 118/79 (BP Location: Right Arm, Patient Position: Sitting)   Pulse 68   Ht 5\' 3"  (1.6 m)   Wt 177 lb 12.8 oz (80.6 kg)   SpO2 98%   BMI 31.50 kg/m     Wt Readings from Last 3 Encounters:  06/27/21 177 lb 12.8 oz (80.6 kg)  05/22/21 167 lb (75.8 kg)  05/15/21 179 lb (81.2 kg)     GEN:  Well nourished, well developed in no acute distress HEENT: Normal NECK: No JVD; No carotid bruits LYMPHATICS: No lymphadenopathy CARDIAC: RRR, no murmurs, rubs, gallops RESPIRATORY:  Clear to auscultation without rales, wheezing or rhonchi  ABDOMEN: Soft, non-tender, non-distended MUSCULOSKELETAL:  No edema; No deformity  SKIN: Warm and dry NEUROLOGIC:  Alert and oriented x 3 PSYCHIATRIC:  Normal affect    Signed, 05/17/21, MD  06/27/2021 3:12 PM    [facility]

## 2021-06-27 ENCOUNTER — Ambulatory Visit (INDEPENDENT_AMBULATORY_CARE_PROVIDER_SITE_OTHER): Payer: Medicare Other | Admitting: Cardiology

## 2021-06-27 ENCOUNTER — Encounter: Payer: Self-pay | Admitting: Cardiology

## 2021-06-27 VITALS — BP 118/79 | HR 68 | Ht 63.0 in | Wt 177.8 lb

## 2021-06-27 DIAGNOSIS — I2510 Atherosclerotic heart disease of native coronary artery without angina pectoris: Secondary | ICD-10-CM

## 2021-06-27 DIAGNOSIS — I255 Ischemic cardiomyopathy: Secondary | ICD-10-CM

## 2021-06-27 DIAGNOSIS — E7801 Familial hypercholesterolemia: Secondary | ICD-10-CM | POA: Diagnosis not present

## 2021-06-27 DIAGNOSIS — Z9861 Coronary angioplasty status: Secondary | ICD-10-CM

## 2021-06-27 DIAGNOSIS — I5022 Chronic systolic (congestive) heart failure: Secondary | ICD-10-CM | POA: Diagnosis not present

## 2021-06-27 NOTE — Patient Instructions (Signed)
Medication Instructions:  Your physician recommends that you continue on your current medications as directed. Please refer to the Current Medication list given to you today.  *If you need a refill on your cardiac medications before your next appointment, please call your pharmacy*   Lab Work: None If you have labs (blood work) drawn today and your tests are completely normal, you will receive your results only by: MyChart Message (if you have MyChart) OR A paper copy in the mail If you have any lab test that is abnormal or we need to change your treatment, we will call you to review the results.   Testing/Procedures: None   Follow-Up: At [facility], you and your health needs are our priority.  As part of our continuing mission to provide you with exceptional heart care, we have created designated Provider Care Teams.  These Care Teams include your primary Cardiologist (physician) and Advanced Practice Providers (APPs -  Physician Assistants and Nurse Practitioners) who all work together to provide you with the care you need, when you need it.  We recommend signing up for the patient portal called "MyChart".  Sign up information is provided on this After Visit Summary.  MyChart is used to connect with patients for Virtual Visits (Telemedicine).  Patients are able to view lab/test results, encounter notes, upcoming appointments, etc.  Non-urgent messages can be sent to your provider as well.   To learn more about what you can do with MyChart, go to [email or web address].    Your next appointment:   3 month(s)  The format for your next appointment:   In Person  Provider:   Brian Munley, MD    Other Instructions None  Important Information About Sugar       

## 2021-07-03 ENCOUNTER — Encounter: Payer: Self-pay | Admitting: Cardiology

## 2021-07-04 ENCOUNTER — Other Ambulatory Visit: Payer: Self-pay | Admitting: Unknown Physician Specialty

## 2021-07-04 DIAGNOSIS — R195 Other fecal abnormalities: Secondary | ICD-10-CM

## 2021-07-04 DIAGNOSIS — K921 Melena: Secondary | ICD-10-CM | POA: Diagnosis not present

## 2021-07-09 ENCOUNTER — Other Ambulatory Visit: Payer: Self-pay | Admitting: Unknown Physician Specialty

## 2021-07-09 DIAGNOSIS — R195 Other fecal abnormalities: Secondary | ICD-10-CM

## 2021-07-09 DIAGNOSIS — Z79899 Other long term (current) drug therapy: Secondary | ICD-10-CM | POA: Diagnosis not present

## 2021-07-10 DIAGNOSIS — E559 Vitamin D deficiency, unspecified: Secondary | ICD-10-CM | POA: Diagnosis not present

## 2021-07-10 DIAGNOSIS — I5022 Chronic systolic (congestive) heart failure: Secondary | ICD-10-CM | POA: Diagnosis not present

## 2021-07-10 DIAGNOSIS — I2510 Atherosclerotic heart disease of native coronary artery without angina pectoris: Secondary | ICD-10-CM | POA: Diagnosis not present

## 2021-07-19 DIAGNOSIS — J45909 Unspecified asthma, uncomplicated: Secondary | ICD-10-CM | POA: Diagnosis not present

## 2021-07-29 ENCOUNTER — Emergency Department (HOSPITAL_BASED_OUTPATIENT_CLINIC_OR_DEPARTMENT_OTHER): Payer: Medicare Other

## 2021-07-29 ENCOUNTER — Encounter: Payer: Self-pay | Admitting: Cardiology

## 2021-07-29 ENCOUNTER — Emergency Department (HOSPITAL_BASED_OUTPATIENT_CLINIC_OR_DEPARTMENT_OTHER)
Admission: EM | Admit: 2021-07-29 | Discharge: 2021-07-29 | Disposition: A | Discharge status: Home / Self Care | Payer: Medicare Other | Attending: Emergency Medicine | Admitting: Emergency Medicine

## 2021-07-29 ENCOUNTER — Other Ambulatory Visit: Payer: Self-pay

## 2021-07-29 ENCOUNTER — Encounter (HOSPITAL_BASED_OUTPATIENT_CLINIC_OR_DEPARTMENT_OTHER): Payer: Self-pay

## 2021-07-29 DIAGNOSIS — R079 Chest pain, unspecified: Secondary | ICD-10-CM

## 2021-07-29 DIAGNOSIS — R0789 Other chest pain: Secondary | ICD-10-CM | POA: Diagnosis not present

## 2021-07-29 DIAGNOSIS — R918 Other nonspecific abnormal finding of lung field: Secondary | ICD-10-CM | POA: Diagnosis not present

## 2021-07-29 DIAGNOSIS — R112 Nausea with vomiting, unspecified: Secondary | ICD-10-CM | POA: Diagnosis not present

## 2021-07-29 DIAGNOSIS — R0602 Shortness of breath: Secondary | ICD-10-CM | POA: Diagnosis not present

## 2021-07-29 DIAGNOSIS — J181 Lobar pneumonia, unspecified organism: Secondary | ICD-10-CM | POA: Insufficient documentation

## 2021-07-29 DIAGNOSIS — Z7982 Long term (current) use of aspirin: Secondary | ICD-10-CM | POA: Diagnosis not present

## 2021-07-29 DIAGNOSIS — J189 Pneumonia, unspecified organism: Secondary | ICD-10-CM

## 2021-07-29 LAB — CBC
HCT: 43.2 % (ref 36.0–46.0)
Hemoglobin: 14.5 g/dL (ref 12.0–15.0)
MCH: 28.2 pg (ref 26.0–34.0)
MCHC: 33.6 g/dL (ref 30.0–36.0)
MCV: 84 fL (ref 80.0–100.0)
Platelets: 240 10*3/uL (ref 150–400)
RBC: 5.14 MIL/uL — ABNORMAL HIGH (ref 3.87–5.11)
RDW: 12.4 % (ref 11.5–15.5)
WBC: 7.4 10*3/uL (ref 4.0–10.5)
nRBC: 0 % (ref 0.0–0.2)

## 2021-07-29 LAB — HEPATIC FUNCTION PANEL
ALT: 25 U/L (ref 0–44)
AST: 25 U/L (ref 15–41)
Albumin: 4.4 g/dL (ref 3.5–5.0)
Alkaline Phosphatase: 80 U/L (ref 38–126)
Bilirubin, Direct: <0.1 mg/dL (ref 0.0–0.2)
Total Bilirubin: 0.3 mg/dL (ref 0.3–1.2)
Total Protein: 7.3 g/dL (ref 6.5–8.1)

## 2021-07-29 LAB — PREGNANCY, URINE: Preg Test, Ur: NEGATIVE

## 2021-07-29 LAB — BASIC METABOLIC PANEL
Anion gap: 7 (ref 5–15)
BUN: 9 mg/dL (ref 6–20)
CO2: 24 mmol/L (ref 22–32)
Calcium: 9 mg/dL (ref 8.9–10.3)
Chloride: 107 mmol/L (ref 98–111)
Creatinine, Ser: 0.71 mg/dL (ref 0.44–1.00)
GFR, Estimated: >60 mL/min (ref >60)
Glucose, Bld: 99 mg/dL (ref 70–99)
Potassium: 3.6 mmol/L (ref 3.5–5.1)
Sodium: 138 mmol/L (ref 135–145)

## 2021-07-29 LAB — TROPONIN I (HIGH SENSITIVITY)
Troponin I (High Sensitivity): 4 ng/L (ref <18)
Troponin I (High Sensitivity): 4 ng/L (ref <18)

## 2021-07-29 LAB — BRAIN NATRIURETIC PEPTIDE: B Natriuretic Peptide: 37.6 pg/mL (ref 0.0–100.0)

## 2021-07-29 LAB — D-DIMER, QUANTITATIVE: D-Dimer, Quant: 0.37 ug/mL-FEU (ref 0.00–0.50)

## 2021-07-29 LAB — LIPASE, BLOOD: Lipase: 33 U/L (ref 11–51)

## 2021-07-29 MED ORDER — DOXYCYCLINE HYCLATE 100 MG PO TABS
100.0000 mg | ORAL_TABLET | Freq: Once | ORAL | Status: AC
  Administered 2021-07-29: 100 mg via ORAL
  Filled 2021-07-29: qty 1

## 2021-07-29 MED ORDER — NITROGLYCERIN 0.4 MG SL SUBL
0.4000 mg | SUBLINGUAL_TABLET | SUBLINGUAL | Status: DC | PRN
  Filled 2021-07-29: qty 1

## 2021-07-29 MED ORDER — ASPIRIN 81 MG PO CHEW
324.0000 mg | CHEWABLE_TABLET | Freq: Once | ORAL | Status: AC
  Administered 2021-07-29: 324 mg via ORAL
  Filled 2021-07-29: qty 4

## 2021-07-29 MED ORDER — SODIUM CHLORIDE 0.9 % IV BOLUS
500.0000 mL | Freq: Once | INTRAVENOUS | Status: AC
  Administered 2021-07-29: 500 mL via INTRAVENOUS

## 2021-07-29 MED ORDER — DOXYCYCLINE HYCLATE 100 MG PO TABS: 100.0000 mg | ORAL_TABLET | Freq: Two times a day (BID) | ORAL | 0 refills | Status: DC

## 2021-07-29 NOTE — Telephone Encounter (Signed)
Called patient who states that she woke up this morning and didn't feel good. She states that she was lethargic and thought she might be getting a cold. She states that her BP as low. She became nauseous and threw up. She states that she was feeling better and went to the cardiac rehab center to workout. BP as still low when it was checked there it was still low around 96/?Marland Kitchen She states that she still is feeling off and has been having some intermittent chest tightness on the right side. She states that it occurs with rest and exertion but is worse with exertion. She reports that she is having some shortness of breath and the ride side of her hurts when taking a deep breath. Describes it as a dull pain. She denies any injury or strain on her right side. Denies any numbness or tingling in her arms as previously experienced. She has not taken any nitroglycerin. Denies any current chest pain. Patient aware of ER precautions and nitro use for chest pain.

## 2021-07-29 NOTE — ED Notes (Signed)
Patient given discharge instructions, all questions answered. Patient in possession of all belongings, directed to the discharge area  

## 2021-07-29 NOTE — Discharge Instructions (Signed)
Take the antibiotics as prescribed.  Follow-up with your primary care doctor or heart doctor to be rechecked.  Return to the ER for worsening symptoms fevers chills difficulty breathing

## 2021-07-29 NOTE — ED Triage Notes (Signed)
C/o right sided chest pain and vomiting since late last night. MI in March 2023. Has an LAD stent.

## 2021-07-29 NOTE — ED Notes (Signed)
States 7 lb weight gain over the past 4 days. Taking spironalactone as prescribed.

## 2021-07-29 NOTE — Telephone Encounter (Signed)
Spoke with the patient and advised her that Dr. Dulce Sellar would like for her to be evaluated in the ER. Patient verbalized understanding and will head there soon.

## 2021-07-29 NOTE — ED Provider Notes (Signed)
[facility] EMERGENCY DEPARTMENT Provider Note   CSN: [id number] Arrival date & time: 07/29/21  1603     History  Chief Complaint  Patient presents with   Chest Pain    Maria Ellis is a 36 y.o. female.   Chest Pain   Patient states she started having symptoms earlier this morning of pain on the right side of her chest associated with nausea and vomiting.  Patient has continued to have a throbbing type discomfort in her chest that at times becomes more intense.  Its been mostly constant.  She has a sensation that it is hard to take a deep breath.  Patient denies any coughing.  She denies any leg swelling.  She does feel like she has had some weight gain.  Patient does have a history of coronary artery disease including an ST elevation myocardial infarction in March 2023.  This was complicated by ventricular fibrillation and cardiac arrest.  Pt states this feels different than her mi.  She called her cardiologist and was told to go to the ED.  Home Medications Prior to Admission medications   Medication Sig Start Date End Date Taking? Authorizing Provider  doxycycline (VIBRA-TABS) 100 MG tablet Take 1 tablet (100 mg total) by mouth 2 (two) times daily. 07/29/21  Yes Linwood Dibbles, MD  AIMOVIG 70 MG/ML SOAJ Apply 70 mg topically every 30 (thirty) days. 01/03/21   [provider]  aspirin 81 MG chewable tablet Chew 1 tablet (81 mg total) by mouth daily. 03/25/21   Azalee Course, PA  citalopram (CELEXA) 40 MG tablet Take 40 mg by mouth daily.    [provider]  dapagliflozin propanediol (FARXIGA) 10 MG TABS tablet Take 1 tablet (10 mg total) by mouth daily. 03/25/21   Azalee Course, PA  ezetimibe (ZETIA) 10 MG tablet Take 1 tablet (10 mg total) by mouth daily. 03/25/21   Azalee Course, PA  famotidine (PEPCID) 20 MG tablet Take 20 mg by mouth at bedtime. 10/18/20   [provider]  fluticasone (FLONASE) 50 MCG/ACT nasal spray Place 1 spray into both nostrils in the morning  and at bedtime. 03/12/16   [provider]  metoprolol succinate (TOPROL-XL) 25 MG 24 hr tablet Take 0.5 tablets (12.5 mg total) by mouth daily. 03/24/21   Azalee Course, PA  nitroGLYCERIN (NITROSTAT) 0.4 MG SL tablet Place 1 tablet (0.4 mg total) under the tongue every 5 (five) minutes as needed for chest pain. 03/24/21   Azalee Course, PA  olopatadine (PATANOL) 0.1 % ophthalmic solution 1 drop 2 (two) times daily as needed for allergies.    [provider]  omeprazole (PRILOSEC) 20 MG capsule Take 1 capsule (20 mg total) by mouth daily. 05/22/21 06/27/21  Smoot, Shawn Route, PA-C  ondansetron (ZOFRAN-ODT) 4 MG disintegrating tablet Take 4 mg by mouth 2 (two) times daily as needed for nausea/vomiting. 02/24/21   [provider]  PROAIR HFA 108 (90 Base) MCG/ACT inhaler Inhale 2 puffs into the lungs every 4 (four) hours as needed for wheezing or shortness of breath.  04/11/16   [provider]  promethazine (PHENERGAN) 25 MG tablet Take 25 mg by mouth every 6 (six) hours as needed for nausea.  03/12/16   [provider]  rizatriptan (MAXALT-MLT) 10 MG disintegrating tablet Take 1 tablet (10 mg total) by mouth as needed. May repeat in 2 hours if needed Patient taking differently: Take 10 mg by mouth as needed for migraine. May repeat in 2 hours  if needed 04/06/17   Nilda Riggs, NP  rosuvastatin (CRESTOR) 40 MG tablet Take 1 tablet (40 mg total) by mouth daily. 03/25/21   Azalee Course, PA  spironolactone (ALDACTONE) 25 MG tablet Take 12.5 mg by mouth 3 (three) times a week. On Monday, Wednesday and Friday    [provider]  SUBOXONE 8-2 MG FILM Place 1 strip under the tongue daily as needed (withdrawal). 03/06/21   [provider]  ticagrelor (BRILINTA) 90 MG TABS tablet Take 1 tablet (90 mg total) by mouth 2 (two) times daily. 03/24/21   Azalee Course, PA  topiramate (TOPAMAX) 50 MG tablet Take 50 mg by mouth daily. 04/11/16   [provider]  Vitamin D,  Ergocalciferol, (DRISDOL) 1.25 MG (50000 UNIT) CAPS capsule Take 50,000 Units by mouth every Sunday.    [provider]      Allergies    Penicillins, Iodinated contrast media, Multihance [gadobenate], and Tylenol [acetaminophen]    Review of Systems   Review of Systems  Cardiovascular:  Positive for chest pain.    Physical Exam Updated Vital Signs BP (!) 103/51   Pulse (!) 53   Temp 98.6 F (37 C) (Oral)   Resp 16   Ht 1.6 m (5\' 3" )   Wt 79.4 kg   LMP 07/23/2021 (Approximate)   SpO2 100%   BMI 31.00 kg/m  Physical Exam Vitals and nursing note reviewed.  Constitutional:      General: She is not in acute distress.    Appearance: She is well-developed.  HENT:     Head: Normocephalic and atraumatic.     Right Ear: External ear normal.     Left Ear: External ear normal.  Eyes:     General: No scleral icterus.       Right eye: No discharge.        Left eye: No discharge.     Conjunctiva/sclera: Conjunctivae normal.  Neck:     Trachea: No tracheal deviation.  Cardiovascular:     Rate and Rhythm: Normal rate and regular rhythm.  Pulmonary:     Effort: Pulmonary effort is normal. No respiratory distress.     Breath sounds: Normal breath sounds. No stridor. No wheezing or rales.  Abdominal:     General: Bowel sounds are normal. There is no distension.     Palpations: Abdomen is soft.     Tenderness: There is no abdominal tenderness. There is no guarding or rebound.  Musculoskeletal:        General: No tenderness or deformity.     Cervical back: Neck supple.  Skin:    General: Skin is warm and dry.     Findings: No rash.  Neurological:     General: No focal deficit present.     Mental Status: She is alert.     Cranial Nerves: No cranial nerve deficit (Right facial droop, extraocular movements intact, no slurred speech).     Sensory: No sensory deficit.     Motor: No abnormal muscle tone or seizure activity.     Coordination: Coordination normal.   Psychiatric:        Mood and Affect: Mood normal.     ED Results / Procedures / Treatments   Labs (all labs ordered are listed, but only abnormal results are displayed) Labs Reviewed  CBC - Abnormal; Notable for the following components:      Result Value   RBC 5.14 (*)    All other components within normal limits  BASIC METABOLIC PANEL  PREGNANCY, URINE  BRAIN NATRIURETIC PEPTIDE  HEPATIC FUNCTION PANEL  LIPASE, BLOOD  D-DIMER, QUANTITATIVE  TROPONIN I (HIGH SENSITIVITY)  TROPONIN I (HIGH SENSITIVITY)    EKG EKG Interpretation  Date/Time:  Monday July 29 2021 16:15:02 EDT Ventricular Rate:  63 PR Interval:  132 QRS Duration: 86 QT Interval:  532 QTC Calculation: 544 R Axis:   57 Text Interpretation: Normal sinus rhythm Abnormal ECG When compared with ECG of 28-Mar-2021 18:01, PREVIOUS ECG IS PRESENT lateral t wave changes decreased since last tracing Confirmed by Linwood Dibbles [phone]) on 07/29/2021 4:18:18 PM  Radiology DG Chest 2 View  Result Date: 07/29/2021 CLINICAL DATA:  Chest pain and shortness of breath. EXAM: CHEST - 2 VIEW COMPARISON:  03/21/2021 FINDINGS: Faint patchy opacity at the right lung base.The cardiomediastinal contours are normal. Pulmonary vasculature is normal. No pleural effusion or pneumothorax. Sternal buckling consistent with prior fracture. No acute osseous abnormalities are seen. IMPRESSION: Faint patchy opacity at the right lung base, atelectasis versus pneumonia. Electronically Signed   By: Narda Rutherford M.D.   On: 07/29/2021 16:35    Procedures Procedures    Medications Ordered in ED Medications  nitroGLYCERIN (NITROSTAT) SL tablet 0.4 mg (has no administration in time range)  aspirin chewable tablet 324 mg (324 mg Oral Given 07/29/21 1723)  doxycycline (VIBRA-TABS) tablet 100 mg (100 mg Oral Given 07/29/21 2046)  sodium chloride 0.9 % bolus 500 mL (500 mLs Intravenous New Bag/Given 07/29/21 2046)    ED Course/ Medical Decision Making/  A&P Clinical Course as of 07/29/21 2102  Mon Jul 29, 2021  1738 Troponin I (High Sensitivity) Normal [JK]  1738 Brain natriuretic peptide Normal [JK]  2022 D-dimer, quantitative Normal [JK]  2022 CBC(!) [JK]  2022 Hepatic function panel [JK]  2022 Blood pressure noted to be low.  Patient states this is typical for her.  We will give a small fluid bolus.  She states usually she drinks some Gatorade at home [JK]  2059 Blood pressure improved with fluid bolus [JK]    Clinical Course User Index [JK] Linwood Dibbles, MD                           Medical Decision Making Problems Addressed: Chest pain, unspecified type: acute illness or injury that poses a threat to life or bodily functions Community acquired pneumonia of right lung, unspecified part of lung: acute illness or injury  Amount and/or Complexity of Data Reviewed Labs: ordered. Decision-making details documented in ED Course. Radiology: ordered.  Risk OTC drugs. Prescription drug management.   Patient presented to the ED for evaluation of chest pain.  Patient has known history of coronary artery disease.  Prior STEMI and cardiac arrest.  Symptoms different than her previous symptoms however patient understandably concerned.  Serial troponins are normal.  EKG is reassuring.  Doubt ACS considering the duration of her symptoms and reassuring evaluation.  Blood pressure was borderline low.  Patient states this is typical for her.  She was given a small fluid bolus with improvement.  No systemic signs of infection.  Patient has a slight cough.  Her chest x-ray does show the possibility of atelectasis versus pneumonia.  No evidence of pulmonary edema.  I will start her on a course of antibiotics.  Recommend close outpatient with PCP and her cardiologist.  Evaluation and diagnostic testing in the emergency department does not suggest an emergent condition requiring admission or immediate intervention  beyond what has been performed at  this time.  The patient is safe for discharge and has been instructed to return immediately for worsening symptoms, change in symptoms or any other concerns.         Final Clinical Impression(s) / ED Diagnoses Final diagnoses:  Community acquired pneumonia of right lung, unspecified part of lung  Chest pain, unspecified type    Rx / DC Orders ED Discharge Orders          Ordered    doxycycline (VIBRA-TABS) 100 MG tablet  2 times daily        07/29/21 2059              Linwood Dibbles, MD 07/29/21 2102

## 2021-07-30 ENCOUNTER — Encounter (HOSPITAL_COMMUNITY): Payer: Medicare Other | Admitting: Internal Medicine

## 2021-08-01 DIAGNOSIS — G2581 Restless legs syndrome: Secondary | ICD-10-CM | POA: Diagnosis not present

## 2021-08-01 DIAGNOSIS — J4520 Mild intermittent asthma, uncomplicated: Secondary | ICD-10-CM | POA: Diagnosis not present

## 2021-08-01 DIAGNOSIS — R5383 Other fatigue: Secondary | ICD-10-CM | POA: Diagnosis not present

## 2021-08-01 DIAGNOSIS — G4734 Idiopathic sleep related nonobstructive alveolar hypoventilation: Secondary | ICD-10-CM | POA: Diagnosis not present

## 2021-08-08 DIAGNOSIS — R5383 Other fatigue: Secondary | ICD-10-CM | POA: Diagnosis not present

## 2021-08-08 DIAGNOSIS — G4734 Idiopathic sleep related nonobstructive alveolar hypoventilation: Secondary | ICD-10-CM | POA: Diagnosis not present

## 2021-08-08 DIAGNOSIS — G2581 Restless legs syndrome: Secondary | ICD-10-CM | POA: Diagnosis not present

## 2021-08-08 DIAGNOSIS — F17210 Nicotine dependence, cigarettes, uncomplicated: Secondary | ICD-10-CM | POA: Diagnosis not present

## 2021-08-08 DIAGNOSIS — J4520 Mild intermittent asthma, uncomplicated: Secondary | ICD-10-CM | POA: Diagnosis not present

## 2021-08-14 ENCOUNTER — Encounter: Payer: Self-pay | Admitting: Cardiology

## 2021-08-14 DIAGNOSIS — Z79899 Other long term (current) drug therapy: Secondary | ICD-10-CM | POA: Diagnosis not present

## 2021-08-16 DIAGNOSIS — J329 Chronic sinusitis, unspecified: Secondary | ICD-10-CM | POA: Diagnosis not present

## 2021-08-18 DIAGNOSIS — J45909 Unspecified asthma, uncomplicated: Secondary | ICD-10-CM | POA: Diagnosis not present

## 2021-08-25 ENCOUNTER — Encounter (HOSPITAL_BASED_OUTPATIENT_CLINIC_OR_DEPARTMENT_OTHER): Payer: Self-pay | Admitting: Emergency Medicine

## 2021-08-25 ENCOUNTER — Other Ambulatory Visit: Payer: Self-pay

## 2021-08-25 ENCOUNTER — Observation Stay (HOSPITAL_BASED_OUTPATIENT_CLINIC_OR_DEPARTMENT_OTHER)
Admission: EM | Admit: 2021-08-25 | Discharge: 2021-08-28 | Disposition: A | Discharge status: Home / Self Care | Payer: Medicare Other | Attending: Internal Medicine | Admitting: Internal Medicine

## 2021-08-25 ENCOUNTER — Encounter (HOSPITAL_COMMUNITY): Payer: Self-pay

## 2021-08-25 DIAGNOSIS — R55 Syncope and collapse: Secondary | ICD-10-CM

## 2021-08-25 DIAGNOSIS — R42 Dizziness and giddiness: Secondary | ICD-10-CM | POA: Diagnosis present

## 2021-08-25 DIAGNOSIS — I219 Acute myocardial infarction, unspecified: Secondary | ICD-10-CM | POA: Diagnosis present

## 2021-08-25 DIAGNOSIS — I959 Hypotension, unspecified: Secondary | ICD-10-CM | POA: Diagnosis not present

## 2021-08-25 DIAGNOSIS — I2510 Atherosclerotic heart disease of native coronary artery without angina pectoris: Secondary | ICD-10-CM | POA: Insufficient documentation

## 2021-08-25 DIAGNOSIS — Q054 Unspecified spina bifida with hydrocephalus: Secondary | ICD-10-CM

## 2021-08-25 DIAGNOSIS — Z79899 Other long term (current) drug therapy: Secondary | ICD-10-CM | POA: Insufficient documentation

## 2021-08-25 DIAGNOSIS — Z955 Presence of coronary angioplasty implant and graft: Secondary | ICD-10-CM | POA: Insufficient documentation

## 2021-08-25 DIAGNOSIS — Z7984 Long term (current) use of oral hypoglycemic drugs: Secondary | ICD-10-CM | POA: Diagnosis not present

## 2021-08-25 DIAGNOSIS — I5022 Chronic systolic (congestive) heart failure: Secondary | ICD-10-CM | POA: Diagnosis not present

## 2021-08-25 DIAGNOSIS — Z7982 Long term (current) use of aspirin: Secondary | ICD-10-CM | POA: Diagnosis not present

## 2021-08-25 DIAGNOSIS — I213 ST elevation (STEMI) myocardial infarction of unspecified site: Secondary | ICD-10-CM | POA: Diagnosis not present

## 2021-08-25 DIAGNOSIS — Z982 Presence of cerebrospinal fluid drainage device: Secondary | ICD-10-CM

## 2021-08-25 DIAGNOSIS — Z87891 Personal history of nicotine dependence: Secondary | ICD-10-CM | POA: Diagnosis not present

## 2021-08-25 HISTORY — DX: Hypotension, unspecified: I95.9

## 2021-08-25 LAB — CBC WITH DIFFERENTIAL/PLATELET
Abs Immature Granulocytes: 0.03 10*3/uL (ref 0.00–0.07)
Basophils Absolute: 0.1 10*3/uL (ref 0.0–0.1)
Basophils Relative: 1 %
Eosinophils Absolute: 0.3 10*3/uL (ref 0.0–0.5)
Eosinophils Relative: 3 %
HCT: 41.5 % (ref 36.0–46.0)
Hemoglobin: 14 g/dL (ref 12.0–15.0)
Immature Granulocytes: 0 %
Lymphocytes Relative: 29 %
Lymphs Abs: 3.4 10*3/uL (ref 0.7–4.0)
MCH: 27.9 pg (ref 26.0–34.0)
MCHC: 33.7 g/dL (ref 30.0–36.0)
MCV: 82.8 fL (ref 80.0–100.0)
Monocytes Absolute: 0.8 10*3/uL (ref 0.1–1.0)
Monocytes Relative: 6 %
Neutro Abs: 7.3 10*3/uL (ref 1.7–7.7)
Neutrophils Relative %: 61 %
Platelets: 244 10*3/uL (ref 150–400)
RBC: 5.01 MIL/uL (ref 3.87–5.11)
RDW: 13.2 % (ref 11.5–15.5)
WBC: 11.9 10*3/uL — ABNORMAL HIGH (ref 4.0–10.5)
nRBC: 0 % (ref 0.0–0.2)

## 2021-08-25 LAB — URINALYSIS, ROUTINE W REFLEX MICROSCOPIC
Bilirubin Urine: NEGATIVE
Glucose, UA: >=500 mg/dL — AB
Ketones, ur: NEGATIVE mg/dL
Leukocytes,Ua: NEGATIVE
Nitrite: NEGATIVE
Protein, ur: NEGATIVE mg/dL
Specific Gravity, Urine: 1.01 (ref 1.005–1.030)
pH: 5 (ref 5.0–8.0)

## 2021-08-25 LAB — COMPREHENSIVE METABOLIC PANEL
ALT: 22 U/L (ref 0–44)
AST: 25 U/L (ref 15–41)
Albumin: 4.3 g/dL (ref 3.5–5.0)
Alkaline Phosphatase: 74 U/L (ref 38–126)
Anion gap: 6 (ref 5–15)
BUN: 16 mg/dL (ref 6–20)
CO2: 24 mmol/L (ref 22–32)
Calcium: 8.9 mg/dL (ref 8.9–10.3)
Chloride: 104 mmol/L (ref 98–111)
Creatinine, Ser: 0.73 mg/dL (ref 0.44–1.00)
GFR, Estimated: >60 mL/min (ref >60)
Glucose, Bld: 106 mg/dL — ABNORMAL HIGH (ref 70–99)
Potassium: 3.4 mmol/L — ABNORMAL LOW (ref 3.5–5.1)
Sodium: 134 mmol/L — ABNORMAL LOW (ref 135–145)
Total Bilirubin: 0.7 mg/dL (ref 0.3–1.2)
Total Protein: 7.2 g/dL (ref 6.5–8.1)

## 2021-08-25 LAB — URINALYSIS, MICROSCOPIC (REFLEX)
Bacteria, UA: NONE SEEN
RBC / HPF: NONE SEEN RBC/hpf (ref 0–5)
WBC, UA: NONE SEEN WBC/hpf (ref 0–5)

## 2021-08-25 LAB — PREGNANCY, URINE: Preg Test, Ur: NEGATIVE

## 2021-08-25 LAB — CBG MONITORING, ED: Glucose-Capillary: 117 mg/dL — ABNORMAL HIGH (ref 70–99)

## 2021-08-25 LAB — LIPASE, BLOOD: Lipase: 31 U/L (ref 11–51)

## 2021-08-25 LAB — LACTIC ACID, PLASMA: Lactic Acid, Venous: 0.6 mmol/L (ref 0.5–1.9)

## 2021-08-25 MED ORDER — SODIUM CHLORIDE 0.9 % IV BOLUS
500.0000 mL | Freq: Once | INTRAVENOUS | Status: AC
  Administered 2021-08-25: 500 mL via INTRAVENOUS

## 2021-08-25 MED ORDER — MECLIZINE HCL 25 MG PO TABS
50.0000 mg | ORAL_TABLET | Freq: Once | ORAL | Status: AC
  Administered 2021-08-25: 50 mg via ORAL
  Filled 2021-08-25: qty 2

## 2021-08-25 NOTE — ED Notes (Signed)
Frozen meal and ginger ale given

## 2021-08-25 NOTE — ED Notes (Signed)
Pt ambulated by EDP, pt reports feeling shaky but denies difficulty/dizziness, steady gait

## 2021-08-25 NOTE — ED Notes (Signed)
Pt ambulated by PA, no difficulty reported or noted, denies dizziness

## 2021-08-25 NOTE — ED Triage Notes (Signed)
Reports low bp and dizziness with nausea for the last few hours.

## 2021-08-25 NOTE — ED Notes (Signed)
ED TO INPATIENT HANDOFF REPORT  ED Nurse Name and Phone #:   S Name/Age/Gender Maria Ellis 36 y.o. female Room/Bed: MH04/MH04  Code Status   Code Status: Prior  Home/SNF/Other Home Patient oriented to: self, place, time, and situation Is this baseline? Yes   Triage Complete: Triage complete  Chief Complaint Hypotension [I95.9]  Triage Note Reports low bp and dizziness with nausea for the last few hours.     Allergies Allergies  Allergen Reactions   Penicillins Anaphylaxis and Swelling        Iodinated Contrast Media Other (See Comments)    Body felt like it was on fire.   Multihance [Gadobenate] Nausea Only and Cough    PT HAD SNEEZING AND NAUSEA IMMEDIATELY AFTER CONTRAST INJECTION.     Tylenol [Acetaminophen] Rash and Other (See Comments)    Stomach cramping    Level of Care/Admitting Diagnosis ED Disposition     ED Disposition  Admit   Condition  --   Comment  Hospital Area: [facility] [[id number]]  Level of Care: Stepdown [14]  Admit to SDU based on following criteria: Hemodynamic compromise or significant risk of instability:  Patient requiring short term acute titration and management of vasoactive drips, and invasive monitoring (i.e., CVP and Arterial line).  Interfacility transfer: Yes  May place patient in observation at [facility] or [facility] if equivalent level of care is available:: Yes  Covid Evaluation: Asymptomatic - no recent exposure (last 10 days) testing not required  Diagnosis: Hypotension [810175]  Admitting Physician: Arlean Hopping [[id number]]  Attending Physician: Arlean Hopping [[id number]]          B Medical/Surgery History Past Medical History:  Diagnosis Date   Bell's palsy    Headache    High cholesterol    History of Chiari malformation    Hydrocephalus (HCC)    Myocardial infarct [facility])    Past Surgical History:  Procedure Laterality Date   BRAIN SURGERY     CORONARY ANGIOPLASTY WITH  STENT PLACEMENT     CORONARY/GRAFT ACUTE MI REVASCULARIZATION N/A 03/20/2021   Procedure: Coronary/Graft Acute MI Revascularization;  Surgeon: Marykay Lex, MD;  Location: [facility] INVASIVE CV LAB;  Service: Cardiovascular;  Laterality: N/A;   LAPAROSCOPIC APPENDECTOMY Left 05/18/2013   Procedure: APPENDECTOMY LAPAROSCOPIC;  Surgeon: Velora Heckler, MD;  Location: [facility] ORS;  Service: General;  Laterality: Left;   LEFT HEART CATH AND CORONARY ANGIOGRAPHY N/A 03/20/2021   Procedure: LEFT HEART CATH AND CORONARY ANGIOGRAPHY;  Surgeon: Marykay Lex, MD;  Location: [facility] INVASIVE CV LAB;  Service: Cardiovascular;  Laterality: N/A;   MULTIPLE TOOTH EXTRACTIONS     SHUNT REPLACEMENT     x 2   TONSILECTOMY, ADENOIDECTOMY, BILATERAL MYRINGOTOMY AND TUBES       A IV Location/Drains/Wounds Patient Lines/Drains/Airways Status     Active Line/Drains/Airways     Name Placement date Placement time Site Days   Peripheral IV 08/25/21 20 G Left Antecubital 08/25/21  1918  Antecubital  less than 1            Intake/Output Last 24 hours No intake or output data in the 24 hours ending 08/25/21 2340  Labs/Imaging Results for orders placed or performed during the hospital encounter of 08/25/21 (from the past 48 hour(s))  CBC with Differential     Status: Abnormal   Collection Time: 08/25/21  7:15 PM  Result Value Ref Range   WBC 11.9 (H) 4.0 - 10.5 K/uL   RBC  5.01 3.87 - 5.11 MIL/uL   Hemoglobin 14.0 12.0 - 15.0 g/dL   HCT 16.1 09.6 - 04.5 %   MCV 82.8 80.0 - 100.0 fL   MCH 27.9 26.0 - 34.0 pg   MCHC 33.7 30.0 - 36.0 g/dL   RDW 40.9 81.1 - 91.4 %   Platelets 244 150 - 400 K/uL   nRBC 0.0 0.0 - 0.2 %   Neutrophils Relative % 61 %   Neutro Abs 7.3 1.7 - 7.7 K/uL   Lymphocytes Relative 29 %   Lymphs Abs 3.4 0.7 - 4.0 K/uL   Monocytes Relative 6 %   Monocytes Absolute 0.8 0.1 - 1.0 K/uL   Eosinophils Relative 3 %   Eosinophils Absolute 0.3 0.0 - 0.5 K/uL   Basophils Relative 1 %   Basophils  Absolute 0.1 0.0 - 0.1 K/uL   Immature Granulocytes 0 %   Abs Immature Granulocytes 0.03 0.00 - 0.07 K/uL    Comment: Performed at [facility], [address] [place], [place] [address]  Comprehensive metabolic panel     Status: Abnormal   Collection Time: 08/25/21  7:15 PM  Result Value Ref Range   Sodium 134 (L) 135 - 145 mmol/L   Potassium 3.4 (L) 3.5 - 5.1 mmol/L   Chloride 104 98 - 111 mmol/L   CO2 24 22 - 32 mmol/L   Glucose, Bld 106 (H) 70 - 99 mg/dL    Comment: Glucose reference range applies only to samples taken after fasting for at least 8 hours.   BUN 16 6 - 20 mg/dL   Creatinine, Ser 6.21 0.44 - 1.00 mg/dL   Calcium 8.9 8.9 - 30.8 mg/dL   Total Protein 7.2 6.5 - 8.1 g/dL   Albumin 4.3 3.5 - 5.0 g/dL   AST 25 15 - 41 U/L   ALT 22 0 - 44 U/L   Alkaline Phosphatase 74 38 - 126 U/L   Total Bilirubin 0.7 0.3 - 1.2 mg/dL   GFR, Estimated >65 >78 mL/min    Comment: (NOTE) Calculated using the CKD-EPI Creatinine Equation (2021)    Anion gap 6 5 - 15    Comment: Performed at [facility], [address] [place], [place] [address]  Lipase, blood     Status: None   Collection Time: 08/25/21  7:15 PM  Result Value Ref Range   Lipase 31 11 - 51 U/L    Comment: Performed at [facility], [address] [place], [place] [address]  CBG monitoring, ED     Status: Abnormal   Collection Time: 08/25/21  7:20 PM  Result Value Ref Range   Glucose-Capillary 117 (H) 70 - 99 mg/dL    Comment: Glucose reference range applies only to samples taken after fasting for at least 8 hours.  Pregnancy, urine     Status: None   Collection Time: 08/25/21  7:23 PM  Result Value Ref Range   Preg Test, Ur NEGATIVE NEGATIVE    Comment:        THE SENSITIVITY OF THIS METHODOLOGY IS >20 mIU/mL. Performed at [facility], [address] [place], [place] [address]   Urinalysis, Routine w reflex microscopic Urine, Clean Catch     Status: Abnormal    Collection Time: 08/25/21  7:23 PM  Result Value Ref Range   Color, Urine YELLOW YELLOW   APPearance CLEAR CLEAR   Specific Gravity, Urine 1.010 1.005 - 1.030  pH 5.0 5.0 - 8.0   Glucose, UA >=500 (A) NEGATIVE mg/dL   Hgb urine dipstick TRACE (A) NEGATIVE   Bilirubin Urine NEGATIVE NEGATIVE   Ketones, ur NEGATIVE NEGATIVE mg/dL   Protein, ur NEGATIVE NEGATIVE mg/dL   Nitrite NEGATIVE NEGATIVE   Leukocytes,Ua NEGATIVE NEGATIVE    Comment: Performed at [facility], [address] [place], [place] [address]  Urinalysis, Microscopic (reflex)     Status: None   Collection Time: 08/25/21  7:23 PM  Result Value Ref Range   RBC / HPF NONE SEEN 0 - 5 RBC/hpf   WBC, UA NONE SEEN 0 - 5 WBC/hpf   Bacteria, UA NONE SEEN NONE SEEN   Squamous Epithelial / LPF 0-5 0 - 5    Comment: Performed at [facility], [address] [place], [place] [address]  Lactic acid, plasma     Status: None   Collection Time: 08/25/21  8:54 PM  Result Value Ref Range   Lactic Acid, Venous 0.6 0.5 - 1.9 mmol/L    Comment: Performed at [facility], [address] [place], [place] [address]   No results found.  Pending Labs Unresulted Labs (From admission, onward)    None       Vitals/Pain Today's Vitals   08/25/21 2158 08/25/21 2200 08/25/21 2245 08/25/21 2300  BP:  (!) 90/45 (!) 85/57 94/60  Pulse: 60 63 64 (!) 58  Resp:  16 16 19   Temp:      TempSrc:      SpO2: 98% 99% 100% 98%  Weight:      Height:      PainSc:        Isolation Precautions No active isolations  Medications Medications  sodium chloride 0.9 % bolus 500 mL (0 mLs Intravenous Stopped 08/25/21 2057)  sodium chloride 0.9 % bolus 500 mL (0 mLs Intravenous Stopped 08/25/21 2141)  meclizine (ANTIVERT) tablet 50 mg (50 mg Oral Given 08/25/21 2109)    Mobility walks Low fall risk   Focused Assessments    R Recommendations: See Admitting Provider Note  Report given to:   Additional Notes:

## 2021-08-25 NOTE — ED Provider Notes (Signed)
[facility] EMERGENCY DEPARTMENT Provider Note   CSN: [id number] Arrival date & time: 08/25/21  1904     History  Chief Complaint  Patient presents with   Dizziness   Hypotension    Maria Ellis is a 36 y.o. female with history of chronic systolic heart failure, CAD status post percutaneous coronary angioplasty (03/2021 performed by Dr. Herbie Baltimore), ischemic cardiomyopathy, Chiari malformation, hydrocephalus.    Presents to the emergency department complaint of dizziness and hypotension.  Patient states that this evening around 5 PM she was sitting down when she had an episode of room spinning dizziness, feeling overheated and feeling nauseous.  Patient attempted to drink fluids to see if this would help with her dizziness however felt like her throat closed up.  Feeling of throat closing and difficulty swallowing lasted just a few seconds and then resolved.  Patient reports she checked her blood pressure and it was 87/46.  Patient reports that her blood pressure normally has a systolic pressure in the 100s.  Patient states that the room spinning dizziness caused her to have a headache.  Headache onset was gradual pain progressively worse over time.  Patient reports that dizziness has improved.  Dizziness can be reproduced with sudden changes in position.  Patient did feel dizzy when standing for orthostatic vital signs.   Patient reports that she has been having shortness of breath intermittently since being diagnosed with pneumonia at the beginning of July.  Patient endorses right-sided facial asymmetry due to Bell's palsy.  Patient denies any recent falls or traumatic injuries.  Patient has been taking all her medications as prescribed.  Patient denies any fever, chest pain, palpitations, leg swelling or tenderness, or abdominal pain, vomiting, diarrhea, dysuria, hematuria, urinary urgency, vaginal pain, vaginal bleeding, vaginal discharge, slurred speech, numbness, weakness, visual  disturbance, syncope, seizures.   Dizziness Associated symptoms: headaches, nausea and shortness of breath   Associated symptoms: no chest pain, no palpitations, no vomiting and no weakness        Home Medications Prior to Admission medications   Medication Sig Start Date End Date Taking? Authorizing Provider  AIMOVIG 70 MG/ML SOAJ Apply 70 mg topically every 30 (thirty) days. 01/03/21   [provider]  aspirin 81 MG chewable tablet Chew 1 tablet (81 mg total) by mouth daily. 03/25/21   Azalee Course, PA  citalopram (CELEXA) 40 MG tablet Take 40 mg by mouth daily.    [provider]  dapagliflozin propanediol (FARXIGA) 10 MG TABS tablet Take 1 tablet (10 mg total) by mouth daily. 03/25/21   Azalee Course, PA  doxycycline (VIBRA-TABS) 100 MG tablet Take 1 tablet (100 mg total) by mouth 2 (two) times daily. 07/29/21   Linwood Dibbles, MD  ezetimibe (ZETIA) 10 MG tablet Take 1 tablet (10 mg total) by mouth daily. 03/25/21   Azalee Course, PA  famotidine (PEPCID) 20 MG tablet Take 20 mg by mouth at bedtime. 10/18/20   [provider]  fluticasone (FLONASE) 50 MCG/ACT nasal spray Place 1 spray into both nostrils in the morning and at bedtime. 03/12/16   [provider]  metoprolol succinate (TOPROL-XL) 25 MG 24 hr tablet Take 0.5 tablets (12.5 mg total) by mouth daily. 03/24/21   Azalee Course, PA  nitroGLYCERIN (NITROSTAT) 0.4 MG SL tablet Place 1 tablet (0.4 mg total) under the tongue every 5 (five) minutes as needed for chest pain. 03/24/21   Azalee Course, PA  olopatadine (PATANOL) 0.1 % ophthalmic solution 1 drop 2 (two)  times daily as needed for allergies.    [provider]  omeprazole (PRILOSEC) 20 MG capsule Take 1 capsule (20 mg total) by mouth daily. 05/22/21 06/27/21  Smoot, Shawn Route, PA-C  ondansetron (ZOFRAN-ODT) 4 MG disintegrating tablet Take 4 mg by mouth 2 (two) times daily as needed for nausea/vomiting. 02/24/21   [provider]  PROAIR HFA 108 (90 Base) MCG/ACT  inhaler Inhale 2 puffs into the lungs every 4 (four) hours as needed for wheezing or shortness of breath.  04/11/16   [provider]  promethazine (PHENERGAN) 25 MG tablet Take 25 mg by mouth every 6 (six) hours as needed for nausea.  03/12/16   [provider]  rizatriptan (MAXALT-MLT) 10 MG disintegrating tablet Take 1 tablet (10 mg total) by mouth as needed. May repeat in 2 hours if needed Patient taking differently: Take 10 mg by mouth as needed for migraine. May repeat in 2 hours if needed 04/06/17   Nilda Riggs, NP  rosuvastatin (CRESTOR) 40 MG tablet Take 1 tablet (40 mg total) by mouth daily. 03/25/21   Azalee Course, PA  spironolactone (ALDACTONE) 25 MG tablet Take 12.5 mg by mouth 3 (three) times a week. On Monday, Wednesday and Friday    [provider]  SUBOXONE 8-2 MG FILM Place 1 strip under the tongue daily as needed (withdrawal). 03/06/21   [provider]  ticagrelor (BRILINTA) 90 MG TABS tablet Take 1 tablet (90 mg total) by mouth 2 (two) times daily. 03/24/21   Azalee Course, PA  topiramate (TOPAMAX) 50 MG tablet Take 50 mg by mouth daily. 04/11/16   [provider]  Vitamin D, Ergocalciferol, (DRISDOL) 1.25 MG (50000 UNIT) CAPS capsule Take 50,000 Units by mouth every Sunday.    [provider]      Allergies    Penicillins, Iodinated contrast media, Multihance [gadobenate], and Tylenol [acetaminophen]    Review of Systems   Review of Systems  Constitutional:  Negative for chills and fever.  Eyes:  Negative for visual disturbance.  Respiratory:  Positive for shortness of breath.   Cardiovascular:  Negative for chest pain, palpitations and leg swelling.  Gastrointestinal:  Positive for nausea. Negative for abdominal pain and vomiting.  Genitourinary:  Positive for frequency (2ndary to spironolactone use). Negative for difficulty urinating, dysuria, flank pain, pelvic pain, vaginal bleeding, vaginal discharge and vaginal pain.   Musculoskeletal:  Negative for back pain and neck pain.  Skin:  Negative for color change and rash.  Neurological:  Positive for dizziness, facial asymmetry and headaches. Negative for tremors, seizures, syncope, speech difficulty, weakness, light-headedness and numbness.  Psychiatric/Behavioral:  Negative for confusion.     Physical Exam Updated Vital Signs BP 105/85   Pulse 60   Temp 98.1 F (36.7 C) (Oral)   Resp 11   Ht 5\' 3"  (1.6 m)   Wt 75.8 kg   LMP 07/23/2021 (Approximate)   SpO2 99%   BMI 29.58 kg/m  Physical Exam Vitals and nursing note reviewed.  Constitutional:      General: She is not in acute distress.    Appearance: She is not ill-appearing, toxic-appearing or diaphoretic.  HENT:     Head: Normocephalic.  Eyes:     General: No scleral icterus.       Right eye: No discharge.        Left eye: No discharge.     Extraocular Movements: Extraocular movements intact.     Conjunctiva/sclera: Conjunctivae normal.  Pupils: Pupils are equal, round, and reactive to light.  Cardiovascular:     Rate and Rhythm: Normal rate.     Pulses:          Radial pulses are 2+ on the right side and 2+ on the left side.  Pulmonary:     Effort: Pulmonary effort is normal. No tachypnea, bradypnea or respiratory distress.     Breath sounds: Normal breath sounds. No stridor.  Abdominal:     General: Abdomen is protuberant. There is no distension. There are no signs of injury.     Palpations: Abdomen is soft. There is no mass.  Musculoskeletal:     Cervical back: Normal range of motion and neck supple. No rigidity.     Right lower leg: No edema.     Left lower leg: No edema.  Skin:    General: Skin is warm and dry.  Neurological:     General: No focal deficit present.     Mental Status: She is alert.     GCS: GCS eye subscore is 4. GCS verbal subscore is 5. GCS motor subscore is 6.     Cranial Nerves: Facial asymmetry present. No cranial nerve deficit or dysarthria.      Sensory: Sensation is intact.     Motor: No weakness, tremor, seizure activity or pronator drift.     Coordination: Finger-Nose-Finger Test normal.     Comments: performed in supine position, +5 strength to bilateral upper extremities, +5 strength to dorsiflexion and plantarflexion, patient able to lift both legs against gravity and hold each there without difficulty, sensation to light touch grossly intact to bilateral upper and lower extremities.  Right-sided facial asymmetry baseline for patient.  Psychiatric:        Behavior: Behavior is cooperative.     ED Results / Procedures / Treatments   Labs (all labs ordered are listed, but only abnormal results are displayed) Labs Reviewed  CBC WITH DIFFERENTIAL/PLATELET - Abnormal; Notable for the following components:      Result Value   WBC 11.9 (*)    All other components within normal limits  COMPREHENSIVE METABOLIC PANEL - Abnormal; Notable for the following components:   Sodium 134 (*)    Potassium 3.4 (*)    Glucose, Bld 106 (*)    All other components within normal limits  URINALYSIS, ROUTINE W REFLEX MICROSCOPIC - Abnormal; Notable for the following components:   Glucose, UA >=500 (*)    Hgb urine dipstick TRACE (*)    All other components within normal limits  CBG MONITORING, ED - Abnormal; Notable for the following components:   Glucose-Capillary 117 (*)    All other components within normal limits  PREGNANCY, URINE  LIPASE, BLOOD  URINALYSIS, MICROSCOPIC (REFLEX)  LACTIC ACID, PLASMA  LACTIC ACID, PLASMA    EKG None  Radiology No results found.  Procedures Procedures    Medications Ordered in ED Medications  sodium chloride 0.9 % bolus 500 mL (0 mLs Intravenous Stopped 08/25/21 2057)  sodium chloride 0.9 % bolus 500 mL (0 mLs Intravenous Stopped 08/25/21 2141)  meclizine (ANTIVERT) tablet 50 mg (50 mg Oral Given 08/25/21 2109)    ED Course/ Medical Decision Making/ A&P                           Medical  Decision Making Amount and/or Complexity of Data Reviewed Labs: ordered.  Risk Decision regarding hospitalization.   Alert 36 year old female in  no acute stress, nontoxic-appearing.  Presents the emergency department for complaint of dizziness and hypotension.  Information was obtained from patient.  Past medical records were reviewed including previous prior notes, labs, and imaging.  Patient has medical history as outlined in HPI which complicates her care.  Per chart review patient had echocardiogram May 2023 which showed EF 40 to 45%.  Neuro exam is reassuring.  Patient has no new focal neurological deficits.  Dizziness is positional.  Suspicion for TIA or CVA [facility] at this time.  Suspect possible vertigo will give patient meclizine and fluids.  I personally viewed interpret patient's lab results.  Pertinent findings include: -Leukocytosis 11.9 -Potassium 3.4 -Lactic acid within normal limits -UA shows no signs of infection  I personally viewed interpret patient's EKG.  Tracing shows sinus rhythm.  Patient fluid bolus was limited due to history of CHF.  After receiving total of 1 L fluid.  Blood pressure remains soft.  Patient did have resolution of dizziness after receiving meclizine and is unable to stand and ambulate without reproduction of symptoms at this time.  Due to near syncope with the setting of persistent hypotension we will consult hospitalist team for admission.  Patient was discussed with and evaluated by Dr. Rhunette Croft.          Final Clinical Impression(s) / ED Diagnoses Final diagnoses:  Hypotension, unspecified hypotension type  Near syncope    Rx / DC Orders ED Discharge Orders     None         Berneice Heinrich 08/25/21 2312    Derwood Kaplan, MD 08/25/21 2320

## 2021-08-26 ENCOUNTER — Encounter (HOSPITAL_COMMUNITY): Payer: Self-pay | Admitting: Internal Medicine

## 2021-08-26 DIAGNOSIS — I213 ST elevation (STEMI) myocardial infarction of unspecified site: Secondary | ICD-10-CM | POA: Diagnosis not present

## 2021-08-26 DIAGNOSIS — I959 Hypotension, unspecified: Secondary | ICD-10-CM

## 2021-08-26 DIAGNOSIS — Z79899 Other long term (current) drug therapy: Secondary | ICD-10-CM | POA: Diagnosis not present

## 2021-08-26 DIAGNOSIS — R55 Syncope and collapse: Secondary | ICD-10-CM | POA: Diagnosis not present

## 2021-08-26 DIAGNOSIS — I2510 Atherosclerotic heart disease of native coronary artery without angina pectoris: Secondary | ICD-10-CM | POA: Diagnosis not present

## 2021-08-26 DIAGNOSIS — I5022 Chronic systolic (congestive) heart failure: Secondary | ICD-10-CM | POA: Diagnosis not present

## 2021-08-26 DIAGNOSIS — R42 Dizziness and giddiness: Secondary | ICD-10-CM | POA: Diagnosis present

## 2021-08-26 DIAGNOSIS — Z955 Presence of coronary angioplasty implant and graft: Secondary | ICD-10-CM | POA: Diagnosis not present

## 2021-08-26 DIAGNOSIS — Z87891 Personal history of nicotine dependence: Secondary | ICD-10-CM | POA: Diagnosis not present

## 2021-08-26 DIAGNOSIS — Z7984 Long term (current) use of oral hypoglycemic drugs: Secondary | ICD-10-CM | POA: Diagnosis not present

## 2021-08-26 DIAGNOSIS — Z7982 Long term (current) use of aspirin: Secondary | ICD-10-CM | POA: Diagnosis not present

## 2021-08-26 LAB — COMPREHENSIVE METABOLIC PANEL
ALT: 17 U/L (ref 0–44)
AST: 18 U/L (ref 15–41)
Albumin: 3.6 g/dL (ref 3.5–5.0)
Alkaline Phosphatase: 66 U/L (ref 38–126)
Anion gap: 3 — ABNORMAL LOW (ref 5–15)
BUN: 13 mg/dL (ref 6–20)
CO2: 25 mmol/L (ref 22–32)
Calcium: 8.7 mg/dL — ABNORMAL LOW (ref 8.9–10.3)
Chloride: 110 mmol/L (ref 98–111)
Creatinine, Ser: 0.69 mg/dL (ref 0.44–1.00)
GFR, Estimated: >60 mL/min (ref >60)
Glucose, Bld: 81 mg/dL (ref 70–99)
Potassium: 3.7 mmol/L (ref 3.5–5.1)
Sodium: 138 mmol/L (ref 135–145)
Total Bilirubin: 0.5 mg/dL (ref 0.3–1.2)
Total Protein: 5.9 g/dL — ABNORMAL LOW (ref 6.5–8.1)

## 2021-08-26 LAB — CBC
HCT: 37.2 % (ref 36.0–46.0)
Hemoglobin: 12.5 g/dL (ref 12.0–15.0)
MCH: 28.1 pg (ref 26.0–34.0)
MCHC: 33.6 g/dL (ref 30.0–36.0)
MCV: 83.6 fL (ref 80.0–100.0)
Platelets: 204 10*3/uL (ref 150–400)
RBC: 4.45 MIL/uL (ref 3.87–5.11)
RDW: 13 % (ref 11.5–15.5)
WBC: 9.2 10*3/uL (ref 4.0–10.5)
nRBC: 0 % (ref 0.0–0.2)

## 2021-08-26 LAB — RAPID URINE DRUG SCREEN, HOSP PERFORMED
Amphetamines: NOT DETECTED
Barbiturates: NOT DETECTED
Benzodiazepines: NOT DETECTED
Cocaine: NOT DETECTED
Opiates: NOT DETECTED
Tetrahydrocannabinol: NOT DETECTED

## 2021-08-26 LAB — HIV ANTIBODY (ROUTINE TESTING W REFLEX): HIV Screen 4th Generation wRfx: NONREACTIVE

## 2021-08-26 LAB — TROPONIN I (HIGH SENSITIVITY): Troponin I (High Sensitivity): 4 ng/L (ref <18)

## 2021-08-26 LAB — CORTISOL: Cortisol, Plasma: 3.5 ug/dL

## 2021-08-26 LAB — LACTIC ACID, PLASMA: Lactic Acid, Venous: 0.6 mmol/L (ref 0.5–1.9)

## 2021-08-26 MED ORDER — BUPRENORPHINE HCL-NALOXONE HCL 8-2 MG SL SUBL: 1.0000 | SUBLINGUAL_TABLET | Freq: Every day | SUBLINGUAL | Status: DC

## 2021-08-26 MED ORDER — ALBUTEROL SULFATE (2.5 MG/3ML) 0.083% IN NEBU: 2.5000 mg | INHALATION_SOLUTION | RESPIRATORY_TRACT | Status: DC | PRN

## 2021-08-26 MED ORDER — TICAGRELOR 90 MG PO TABS
90.0000 mg | ORAL_TABLET | Freq: Two times a day (BID) | ORAL | Status: DC
  Administered 2021-08-26 – 2021-08-28 (×5): 90 mg via ORAL
  Filled 2021-08-26 (×5): qty 1

## 2021-08-26 MED ORDER — EZETIMIBE 10 MG PO TABS
10.0000 mg | ORAL_TABLET | Freq: Every day | ORAL | Status: DC
  Administered 2021-08-26 – 2021-08-28 (×3): 10 mg via ORAL
  Filled 2021-08-26 (×3): qty 1

## 2021-08-26 MED ORDER — ASPIRIN 81 MG PO CHEW
81.0000 mg | CHEWABLE_TABLET | Freq: Every day | ORAL | Status: DC
  Administered 2021-08-26 – 2021-08-28 (×3): 81 mg via ORAL
  Filled 2021-08-26 (×3): qty 1

## 2021-08-26 MED ORDER — ROSUVASTATIN CALCIUM 20 MG PO TABS
40.0000 mg | ORAL_TABLET | Freq: Every day | ORAL | Status: DC
  Administered 2021-08-26 – 2021-08-28 (×3): 40 mg via ORAL
  Filled 2021-08-26 (×3): qty 2

## 2021-08-26 MED ORDER — BUPRENORPHINE HCL-NALOXONE HCL 8-2 MG SL SUBL
1.0000 | SUBLINGUAL_TABLET | Freq: Every day | SUBLINGUAL | Status: DC
  Administered 2021-08-26 – 2021-08-28 (×3): 1 via SUBLINGUAL
  Filled 2021-08-26 (×3): qty 1

## 2021-08-26 MED ORDER — IBUPROFEN 200 MG PO TABS: 400.0000 mg | ORAL_TABLET | Freq: Four times a day (QID) | ORAL | Status: DC | PRN

## 2021-08-26 MED ORDER — HEPARIN SODIUM (PORCINE) 5000 UNIT/ML IJ SOLN
5000.0000 [IU] | Freq: Three times a day (TID) | INTRAMUSCULAR | Status: DC
  Administered 2021-08-26 – 2021-08-28 (×7): 5000 [IU] via SUBCUTANEOUS
  Filled 2021-08-26 (×7): qty 1

## 2021-08-26 MED ORDER — COSYNTROPIN 0.25 MG IJ SOLR
0.2500 mg | Freq: Once | INTRAMUSCULAR | Status: AC
  Administered 2021-08-27: 0.25 mg via INTRAVENOUS
  Filled 2021-08-26: qty 0.25

## 2021-08-26 MED ORDER — IBUPROFEN 200 MG PO TABS
400.0000 mg | ORAL_TABLET | Freq: Once | ORAL | Status: DC
Start: 2021-08-27 — End: 2021-08-27

## 2021-08-26 MED ORDER — TOPIRAMATE 25 MG PO TABS
50.0000 mg | ORAL_TABLET | Freq: Every day | ORAL | Status: DC
  Administered 2021-08-26 – 2021-08-28 (×3): 50 mg via ORAL
  Filled 2021-08-26 (×3): qty 2

## 2021-08-26 NOTE — Care Management Obs Status (Signed)
MEDICARE OBSERVATION STATUS NOTIFICATION   Patient Details  Name: Maria Ellis MRN: [id number] Date of Birth: 1985/12/03   Medicare Observation Status Notification Given:  Yes    Gala Lewandowsky, RN 08/26/2021, 4:40 PM

## 2021-08-26 NOTE — H&P (Signed)
History and Physical    Maria Ellis BDZ:[id number] DOB: 1985-02-28 DOA: 08/25/2021  PCP: Crist Fat, MD  Patient coming from: Home.  Chief Complaint: Dizziness.  HPI: Maria Ellis is a 36 y.o. female with history of CAD status post stenting with admission in March of this year for V-fib arrest underwent stenting of the LAD presently on Brilinta aspirin and statins started experiencing weakness and dizziness last evening.  Denies any nausea vomiting diarrhea fever chills chest pain shortness of breath or any diaphoresis.  No change in medications. Last month patient did notice some blood in her stools and had followed up with Dr. Charm Barges who was planning to do a CT scan of the abdomen next week.  Since last month blood in the stools have stopped.  ED Course: In the ER patient remained hypotensive and was given 2 L fluid bolus lactic acid and troponins were negative.  No signs of any cardiogenic shock.  Patient admitted for further observation.  Patient does not look septic.  Repeat hemoglobin has dropped by 1-1/2 g.  Review of Systems: As per HPI, rest all negative.   Past Medical History:  Diagnosis Date   Bell's palsy    Headache    High cholesterol    History of Chiari malformation    Hydrocephalus (HCC)    Myocardial infarct [facility])     Past Surgical History:  Procedure Laterality Date   BRAIN SURGERY     CORONARY ANGIOPLASTY WITH STENT PLACEMENT     CORONARY/GRAFT ACUTE MI REVASCULARIZATION N/A 03/20/2021   Procedure: Coronary/Graft Acute MI Revascularization;  Surgeon: Marykay Lex, MD;  Location: [facility] INVASIVE CV LAB;  Service: Cardiovascular;  Laterality: N/A;   LAPAROSCOPIC APPENDECTOMY Left 05/18/2013   Procedure: APPENDECTOMY LAPAROSCOPIC;  Surgeon: Velora Heckler, MD;  Location: [facility] ORS;  Service: General;  Laterality: Left;   LEFT HEART CATH AND CORONARY ANGIOGRAPHY N/A 03/20/2021   Procedure: LEFT HEART CATH AND CORONARY ANGIOGRAPHY;  Surgeon: Marykay Lex, MD;   Location: [facility] INVASIVE CV LAB;  Service: Cardiovascular;  Laterality: N/A;   MULTIPLE TOOTH EXTRACTIONS     SHUNT REPLACEMENT     x 2   TONSILECTOMY, ADENOIDECTOMY, BILATERAL MYRINGOTOMY AND TUBES       reports that she has quit smoking. Her smoking use included cigarettes. She has been exposed to tobacco smoke. She has never used smokeless tobacco. She reports current alcohol use. She reports that she does not use drugs.  Allergies  Allergen Reactions   Penicillins Anaphylaxis and Swelling        Iodinated Contrast Media Other (See Comments)    Body felt like it was on fire.   Multihance [Gadobenate] Nausea Only and Cough    PT HAD SNEEZING AND NAUSEA IMMEDIATELY AFTER CONTRAST INJECTION.     Tylenol [Acetaminophen] Rash and Other (See Comments)    Stomach cramping    Family History  Problem Relation Age of Onset   Diabetes Mother    Diabetes Father    Heart failure Father    Cirrhosis Father     Prior to Admission medications   Medication Sig Start Date End Date Taking? Authorizing Provider  AIMOVIG 70 MG/ML SOAJ Apply 70 mg topically every 30 (thirty) days. 01/03/21   [provider]  aspirin 81 MG chewable tablet Chew 1 tablet (81 mg total) by mouth daily. 03/25/21   Azalee Course, PA  citalopram (CELEXA) 40 MG tablet Take 40 mg by mouth daily.  [provider]  dapagliflozin propanediol (FARXIGA) 10 MG TABS tablet Take 1 tablet (10 mg total) by mouth daily. 03/25/21   Azalee Course, PA  doxycycline (VIBRA-TABS) 100 MG tablet Take 1 tablet (100 mg total) by mouth 2 (two) times daily. 07/29/21   Linwood Dibbles, MD  ezetimibe (ZETIA) 10 MG tablet Take 1 tablet (10 mg total) by mouth daily. 03/25/21   Azalee Course, PA  famotidine (PEPCID) 20 MG tablet Take 20 mg by mouth at bedtime. 10/18/20   [provider]  fluticasone (FLONASE) 50 MCG/ACT nasal spray Place 1 spray into both nostrils in the morning and at bedtime. 03/12/16   [provider]  metoprolol  succinate (TOPROL-XL) 25 MG 24 hr tablet Take 0.5 tablets (12.5 mg total) by mouth daily. 03/24/21   Azalee Course, PA  nitroGLYCERIN (NITROSTAT) 0.4 MG SL tablet Place 1 tablet (0.4 mg total) under the tongue every 5 (five) minutes as needed for chest pain. 03/24/21   Azalee Course, PA  olopatadine (PATANOL) 0.1 % ophthalmic solution 1 drop 2 (two) times daily as needed for allergies.    [provider]  omeprazole (PRILOSEC) 20 MG capsule Take 1 capsule (20 mg total) by mouth daily. 05/22/21 06/27/21  Smoot, Shawn Route, PA-C  ondansetron (ZOFRAN-ODT) 4 MG disintegrating tablet Take 4 mg by mouth 2 (two) times daily as needed for nausea/vomiting. 02/24/21   [provider]  PROAIR HFA 108 (90 Base) MCG/ACT inhaler Inhale 2 puffs into the lungs every 4 (four) hours as needed for wheezing or shortness of breath.  04/11/16   [provider]  promethazine (PHENERGAN) 25 MG tablet Take 25 mg by mouth every 6 (six) hours as needed for nausea.  03/12/16   [provider]  rizatriptan (MAXALT-MLT) 10 MG disintegrating tablet Take 1 tablet (10 mg total) by mouth as needed. May repeat in 2 hours if needed Patient taking differently: Take 10 mg by mouth as needed for migraine. May repeat in 2 hours if needed 04/06/17   Nilda Riggs, NP  rosuvastatin (CRESTOR) 40 MG tablet Take 1 tablet (40 mg total) by mouth daily. 03/25/21   Azalee Course, PA  spironolactone (ALDACTONE) 25 MG tablet Take 12.5 mg by mouth 3 (three) times a week. On Monday, Wednesday and Friday    [provider]  SUBOXONE 8-2 MG FILM Place 1 strip under the tongue daily as needed (withdrawal). 03/06/21   [provider]  ticagrelor (BRILINTA) 90 MG TABS tablet Take 1 tablet (90 mg total) by mouth 2 (two) times daily. 03/24/21   Azalee Course, PA  topiramate (TOPAMAX) 50 MG tablet Take 50 mg by mouth daily. 04/11/16   [provider]  Vitamin D, Ergocalciferol, (DRISDOL) 1.25 MG (50000 UNIT) CAPS capsule Take  50,000 Units by mouth every Sunday.    [provider]    Physical Exam: Constitutional: Moderately built and nourished. Vitals:   08/26/21 0111 08/26/21 0211 08/26/21 0241 08/26/21 0522  BP:  (!) 84/60 100/62 (!) 95/53  Pulse: 63 64  (!) 59  Resp: 19 19  16   Temp:  98.5 F (36.9 C)  98.4 F (36.9 C)  TempSrc:  Oral  Oral  SpO2: 100% 97%    Weight:  80.6 kg    Height:  5\' 3"  (1.6 m)     Eyes: Anicteric no pallor. ENMT: No discharge from the ears eyes nose and mouth. Neck: No mass felt.  No neck rigidity. Respiratory: No rhonchi or crepitations. Cardiovascular:  S1-S2 heard. Abdomen: Soft nontender bowel sound present. Musculoskeletal: No edema. Skin: No rash. Neurologic: Alert awake oriented time place and person.  Moves all extremities. Psychiatric: Appears normal.  Normal affect.   Labs on Admission: I have personally reviewed following labs and imaging studies  CBC: Recent Labs  Lab 08/25/21 1915 08/26/21 0325  WBC 11.9* 9.2  NEUTROABS 7.3  --   HGB 14.0 12.5  HCT 41.5 37.2  MCV 82.8 83.6  PLT 244 204   Basic Metabolic Panel: Recent Labs  Lab 08/25/21 1915 08/26/21 0325  NA 134* 138  K 3.4* 3.7  CL 104 110  CO2 24 25  GLUCOSE 106* 81  BUN 16 13  CREATININE 0.73 0.69  CALCIUM 8.9 8.7*   GFR: Estimated Creatinine Clearance: 97.8 mL/min (by C-G formula based on SCr of 0.69 mg/dL). Liver Function Tests: Recent Labs  Lab 08/25/21 1915 08/26/21 0325  AST 25 18  ALT 22 17  ALKPHOS 74 66  BILITOT 0.7 0.5  PROT 7.2 5.9*  ALBUMIN 4.3 3.6   Recent Labs  Lab 08/25/21 1915  LIPASE 31   No results for input(s): "AMMONIA" in the last 168 hours. Coagulation Profile: No results for input(s): "INR", "PROTIME" in the last 168 hours. Cardiac Enzymes: No results for input(s): "CKTOTAL", "CKMB", "CKMBINDEX", "TROPONINI" in the last 168 hours. BNP (last 3 results) Recent Labs    05/15/21 1106  PROBNP 352*   HbA1C: No results for input(s):  "HGBA1C" in the last 72 hours. CBG: Recent Labs  Lab 08/25/21 1920  GLUCAP 117*   Lipid Profile: No results for input(s): "CHOL", "HDL", "LDLCALC", "TRIG", "CHOLHDL", "LDLDIRECT" in the last 72 hours. Thyroid Function Tests: No results for input(s): "TSH", "T4TOTAL", "FREET4", "T3FREE", "THYROIDAB" in the last 72 hours. Anemia Panel: No results for input(s): "VITAMINB12", "FOLATE", "FERRITIN", "TIBC", "IRON", "RETICCTPCT" in the last 72 hours. Urine analysis:    Component Value Date/Time   COLORURINE YELLOW 08/25/2021 1923   APPEARANCEUR CLEAR 08/25/2021 1923   LABSPEC 1.010 08/25/2021 1923   PHURINE 5.0 08/25/2021 1923   GLUCOSEU >=500 (A) 08/25/2021 1923   HGBUR TRACE (A) 08/25/2021 1923   BILIRUBINUR NEGATIVE 08/25/2021 1923   KETONESUR NEGATIVE 08/25/2021 1923   PROTEINUR NEGATIVE 08/25/2021 1923   UROBILINOGEN 0.2 05/17/2013 2000   NITRITE NEGATIVE 08/25/2021 1923   LEUKOCYTESUR NEGATIVE 08/25/2021 1923   Sepsis Labs: @LABRCNTIP (procalcitonin:4,lacticidven:4) )No results found for this or any previous visit (from the past 240 hour(s)).   Radiological Exams on Admission: No results found.  EKG: Independently reviewed.  Normal sinus rhythm.  Assessment/Plan Principal Problem:   Hypotension Active Problems:   S/P VP shunt   ST elevation myocardial infarction (STEMI) (HCC)   Spina bifida with hydrocephalus (HCC)    Hypotension -cause not clear.  Patient does not look septic.  We will hold off further fluids.  The time of my exam patient blood pressure is 94 systolic.  Will check cortisol levels continue to observe on telemetry check orthostatics in the morning.  Since hemoglobin did drop by 1 and half grams we will repeat CBC.  Holding patient's Toprol, spironolactone for now. CAD status post stenting we will continue with patient's aspirin, Brilinta statins and Zetia.  Holding beta-blockers due to low blood pressure. Ischemic cardiomyopathy last EF measured in May  was 40 to 45%.  Patient did receive 2 L fluid bolus in the ER.  Continue to monitor. Recently had some blood in the stools was followed by Dr. June gastroenterologist at [facility]  planning to CT scan next week.  Patient states that since last month she did not have any further GI bleed.  Repeat hemoglobin done today showed drop of hemoglobin by 1.5 g could be dilutional.  We will repeat CBC to ensure stability. History of VP shunt. Patient takes Suboxone.   DVT prophylaxis: Heparin. Code Status: Full code. Family Communication: Discussed with patient. Disposition Plan: Home. Consults called: None. Admission status: Observation.   Eduard Clos MD [facility] Pager [phone].  If 7PM-7AM, please contact night-coverage [email or web address] Password [email or web address]  08/26/2021, 5:52 AM

## 2021-08-26 NOTE — Progress Notes (Signed)
08/26/21 1245  Clinical Encounter Type  Visited With Patient  Visit Type Initial  Referral From Nurse (Dana L. Chilton Si, RN)  Consult/Referral To Chaplain Benetta Spar)  Recommendations Advance Directive Educative   Chaplain responded to spiritual care consultation requesting assistance for Advance Directive preparation with Ms. Tylene Fantasia. [place]. Chaplain met with Ms. Lomeli at patient's bedside.   Ms. Delone stated that she is NOT Married.  Ms. Knotek indicated that she intends to list her Marchia Meiers, Mr. Donnie Aho [phone], as her Primary Health Care Power of [place].   Ms. Latouche has chosen her Dayna, Alia [phone] to be her Alternate Health Care Power of [place].   Chaplain provided the Advance Directive packet as well as education on Advance Directives-documents an individual completes to communicate their health care directions in advance of a time when they may need them. Chaplain informed Ms. Wray the documents which may be completed here in the hospital are the Living Will and Health Care Power of [place].   Chaplain informed Ms. Zellner that the Health Care Power of [place] is a legal document in which an individual names another person, as their Health Care Agent, to communicate her health care decisions, when she is not able to communicate them for herself. The Health Care Agent's function can be temporary or permanent depending on her ability to make and communicate those decisions independently.   Chaplain informed Ms. Golonka in the absence of a Health Care Power of [place], the state of [place] directs health care providers to look to the following individuals in the order listed: legal guardian; an attorney-in-fact under a general power of attorney (POA) if that POA includes the right to make health care decisions; person's spouse; a majority of her adult children; a majority of adult brothers and sisters; or an individual who has an established  relationship with you, who is acting in good faith and who can convey your wishes.  If none of these persons are available or willing to make medical decisions on a patient's behalf, the law allows the patient's doctor to make decisions for them as long as another doctor agrees with those decisions.  Chaplain also informed the patient that the Health Care agent has no decision-making authority over any affairs other than those related to her medical care.   The chaplain further educated Ms. Jerrell that a Living Will is a legal document that allows her desires not to receive life-prolonging measures in the event that she has a condition that is incurable and will result in her death in a short period of time; they are unconscious, and doctors are confident that she will not regain consciousness; and/or she has advanced dementia or other substantial and irreversible loss of mental function.   The chaplain informed Ms. Rumberger that life-prolonging measures are medical treatments that would only serve to postpone death, including breathing machines, kidney dialysis, antibiotics, artificial nutrition and hydration (tube feeding), and similar forms of treatment and that if an individual is able to express their wishes, they may also make them known without the use of a Living Will, but in the event that she is not able to express her wishes, a Living Will allows medical providers and the her family and friends to ensure that they are not making decisions on her behalf, but rather serving as her voice to convey decisions that she has already made.   Ms. Mathena is aware that the decision to create an advance directive is  hers alone and she may choose not to complete the documents or may choose to complete one portion or both.  Ms. Netzley was informed that she can revoke the documents at any time by striking through them and writing void or by completing new documents, but that it is also advisable that the individual  verbally notify interested parties that her wishes have changed.   Ms. Danis is also aware that the document must be signed in the presence of a notary public and two witnesses and that this may be done while the patient is still admitted to the hospital or after discharge in the community. If they decide to complete Advance Directives after being discharged from the hospital, they have been advised to notify all interested parties and to provide those documents to their physicians and loved ones in addition to bringing them to the hospital in the event of another hospitalization.   The chaplain informed the Ms. Manuele that if she desires to proceed with completing the Advance Directive Documentation while she is still admitted, notary services are typically available at [facility] between the hours of 1:00 and 3:30 Monday-Thursday.   When the patient is ready to have these documents completed, the patient should request that their nurse place a spiritual care consult and indicate that the patient is ready to have their advance directives notarized so that arrangements for witnesses and notary public can be made.  If there is an immediate need to have notarized please page the Chaplain.   Please page spiritual care if the patient desires further education or has questions.   [address] [place], M. Min., [phone].

## 2021-08-26 NOTE — Progress Notes (Signed)
Seen and examined, admitted by Dr. Toniann Fail at 5:50 AM today, please see his H&P for details, briefly [name] with history of narcotic dependence on Suboxone was admitted in March, 23 with V-fib arrest, STEMI noted to have 100% LAD occlusion treated with stenting, noted to have ischemic cardiomyopathy EF of 30%, discharged home on GDMT.  Was in her usual state of health until yesterday, reported feeling lightheaded and dizzy, blood pressures in the 80s and came to the ER. -SBP in the 80s, given IV fluid bolus X1 liter  Hypotension -Etiology is not clear, no signs or symptoms of infectious process or sepsis, clinically do not suspect cardiogenic shock as well, UDS is negative -Random cortisol is low, will check cosyntropin stim test to rule out adrenal insufficiency -Suspect cardiomyopathy and CHF meds could be contributing -On Farxiga Toprol and Aldactone at baseline, currently on hold  CAD -Recent LAD stent in 3/23, continue aspirin Brilinta statin and Zetia -Holding Toprol with low BPs  Chronic systolic CHF, ischemic cardiomyopathy -EF previously 35%, improved to 40-45% -Clinically appears euvolemic, Farxiga Toprol and Aldactone on hold as above  History of narcotic dependence -On Suboxone, continued  History of VP shunt -History of neonatal hydrocephalus  Maria Cove, MD

## 2021-08-26 NOTE — Progress Notes (Signed)
   08/26/21 0900  Clinical Encounter Type  Visited With Patient not available  Referral From Nurse (Dana L. Chilton Si, RN)  Consult/Referral To Chaplain Benetta Spar)  Recommendations Advance Directive   Attempted A.D. Education, but patient sleeping.

## 2021-08-27 ENCOUNTER — Encounter (HOSPITAL_COMMUNITY): Payer: Self-pay | Admitting: Internal Medicine

## 2021-08-27 DIAGNOSIS — R55 Syncope and collapse: Secondary | ICD-10-CM

## 2021-08-27 DIAGNOSIS — I959 Hypotension, unspecified: Secondary | ICD-10-CM | POA: Diagnosis not present

## 2021-08-27 LAB — COMPREHENSIVE METABOLIC PANEL
ALT: 20 U/L (ref 0–44)
AST: 23 U/L (ref 15–41)
Albumin: 3.9 g/dL (ref 3.5–5.0)
Alkaline Phosphatase: 72 U/L (ref 38–126)
Anion gap: 8 (ref 5–15)
BUN: 11 mg/dL (ref 6–20)
CO2: 23 mmol/L (ref 22–32)
Calcium: 9 mg/dL (ref 8.9–10.3)
Chloride: 109 mmol/L (ref 98–111)
Creatinine, Ser: 0.7 mg/dL (ref 0.44–1.00)
GFR, Estimated: >60 mL/min (ref >60)
Glucose, Bld: 115 mg/dL — ABNORMAL HIGH (ref 70–99)
Potassium: 3.7 mmol/L (ref 3.5–5.1)
Sodium: 140 mmol/L (ref 135–145)
Total Bilirubin: 0.3 mg/dL (ref 0.3–1.2)
Total Protein: 6.7 g/dL (ref 6.5–8.1)

## 2021-08-27 LAB — CBC
HCT: 41.1 % (ref 36.0–46.0)
Hemoglobin: 13.7 g/dL (ref 12.0–15.0)
MCH: 28 pg (ref 26.0–34.0)
MCHC: 33.3 g/dL (ref 30.0–36.0)
MCV: 83.9 fL (ref 80.0–100.0)
Platelets: 211 10*3/uL (ref 150–400)
RBC: 4.9 MIL/uL (ref 3.87–5.11)
RDW: 13.2 % (ref 11.5–15.5)
WBC: 6.6 10*3/uL (ref 4.0–10.5)
nRBC: 0 % (ref 0.0–0.2)

## 2021-08-27 LAB — ACTH STIMULATION, 3 TIME POINTS
Cortisol, 30 Min: 16 ug/dL
Cortisol, 60 Min: 16.4 ug/dL
Cortisol, Base: 10 ug/dL

## 2021-08-27 MED ORDER — SPIRONOLACTONE 12.5 MG HALF TABLET
12.5000 mg | ORAL_TABLET | Freq: Every day | ORAL | Status: DC
  Administered 2021-08-27 – 2021-08-28 (×2): 12.5 mg via ORAL
  Filled 2021-08-27 (×2): qty 1

## 2021-08-27 NOTE — Progress Notes (Addendum)
Heart Failure Stewardship Pharmacist Progress Note   PCP: Crist Fat, MD PCP-Cardiologist: None    HPI:  36 yo F with PMH of CAD s/p DES to LAD, VF arrest, HLD.  On 03/20/21, she suffered out of hospital VF arrest at home. Immediate bystander CPR was initiated. On EMS arrival, she was in VF w/ achievement of ROSC w/ resuscitation efforts. EKG showed acute anterior STEMI. Transported to [facility]. Emergent LHC showed 100% occluded pLAD. All other vessels patent. Underwent PCI + DES. Echo showed moderately reduced LVEF, 35-40%. RV normal. discharge with losartan 12.5 mg daily, Farxiga, and metoprolol 12.5 mg daily.  She presented to [facility] clinic on 04/01/21. BP 106/68 and euvolemic. Denied any orthostatic events. She was started on spironolactone 12.5 mg daily.  Stopped losartan and reduced spironolactone to MWF with Dr. Dulce Sellar due to hypotension.  In the ED on 5/3 with rectal bleeding. ECHO 05/23/21 with LVEF 35-40%.  Repeat ECHO 06/14/21 with slightly improved LVEF 40-45%.   She presented to the ED on 8/6 with hypotension, dizziness, and nausea. Denies recent bleeding.  Current HF Medications: None  Prior to admission HF Medications: Beta blocker: metoprolol 12.5 mg daily MRA: spironolactone 12.5 mg MWF SGLT2i: Farxiga 10 mg daily  Pertinent Lab Values: Serum creatinine 0.70, BUN 11, Potassium 3.7, Sodium 140, A1c 5.1   Vital Signs: Weight: 177 lbs (admission weight: 177 lbs) Blood pressure: 100/50s  Heart rate: 50-60s  I/O: not well documented  Medication Assistance / Insurance Benefits Check: Does the patient have prescription insurance?  Yes Type of insurance plan: [place] Medicaid  Outpatient Pharmacy:  Prior to admission [facility] Is the patient willing to use [facility] TOC pharmacy at discharge? Yes Is the patient willing to transition their outpatient pharmacy to utilize a [facility] outpatient pharmacy?   Pending    Assessment: Chronic systolic CHF (LVEF 40-45%),  due to ICM. NYHA class I-II symptoms. - Not volume overloaded on exam. No indications for diuretics. - Metoprolol XL, spironolactone, and Farxiga on hold with hypotension   Plan: 1) Medication changes recommended at this time: - Continue holding GDMT while hypotensive  2) Patient assistance: - None needed, has [place] Medicaid  3)  Education  - To be completed prior to discharge  Sharen Hones, PharmD, BCPS Heart Failure Engineer, building services Phone [phone]

## 2021-08-27 NOTE — Progress Notes (Signed)
Heart Failure Nurse Navigator Progress Note  PCP: Crist Fat, MD PCP-Cardiologist: [facility] Admission Diagnosis: Hypotension, near syncope.  Admitted from: Home  Presentation:   Maria Ellis presented with hypotension and dizziness. Hx of hydrocephalus with shunt and STEMI with V-fib arrest in March 23, stent to LAD,d'c on GDMT.  Right side facial asymmetry due to Bell's palsy. BP 84/60, HR 60, 1 L fluid bolus given, admitted.   Patient was educated on the sign and symptoms of heart failure, daily weights, ( patient states she weighs daily) when to call her doctor or go to the ER. Diet and fluid restrictions (patient states she follows a low sodium diet and watches her fluids) Takes all her medications as prescribed and attends her medical appointments. Patient was very interactive in education and asked a number of questions, she verbalized that she understood education provided and is scheduled for a HF TOC appointment on 09/09/21 @ 10 am.   ECHO/ LVEF: 40-45%   Clinical Course:  Past Medical History:  Diagnosis Date   Bell's palsy    Headache    High cholesterol    History of Chiari malformation    Hydrocephalus (HCC)    Myocardial infarct (HCC)      Social History   Socioeconomic History   Marital status: Single    Spouse name: Not on file   Number of children: 0   Years of education: 12   Highest education level: High school graduate  Occupational History   Occupation: Disabled  Tobacco Use   Smoking status: Former    Years: 21.00    Types: Cigarettes    Quit date: 03/20/2021    Years since quitting: 0.4    Passive exposure: Past   Smokeless tobacco: Never   Tobacco comments:    Quit after her MI in July 2023  Vaping Use   Vaping Use: Never used  Substance and Sexual Activity   Alcohol use: Yes    Comment: occ   Drug use: Yes    Types: Marijuana    Comment: July 2023   Sexual activity: Yes    Birth control/protection: None  Other Topics Concern   Not on  file  Social History Narrative   Lives at home with her mother.   Left-handed.   8-9 cups caffeine per day.   Social Determinants of Health   Financial Resource Strain: Low Risk  (08/27/2021)   Overall Financial Resource Strain (CARDIA)    Difficulty of Paying Living Expenses: Not very hard  Food Insecurity: No Food Insecurity (08/27/2021)   Hunger Vital Sign    Worried About Running Out of Food in the Last Year: Never true    Ran Out of Food in the Last Year: Never true  Transportation Needs: No Transportation Needs (08/27/2021)   PRAPARE - Administrator, Civil Service (Medical): No    Lack of Transportation (Non-Medical): No  Physical Activity: Not on file  Stress: Not on file  Social Connections: Not on file   Education Assessment and Provision:  Detailed education and instructions provided on heart failure disease management including the following:  Signs and symptoms of Heart Failure When to call the physician Importance of daily weights Low sodium diet Fluid restriction Medication management Anticipated future follow-up appointments  Patient education given on each of the above topics.  Patient acknowledges understanding via teach back method and acceptance of all instructions.  Education Materials:  "Living Better With Heart Failure" Booklet, HF zone tool, &  Daily Weight Tracker Tool.  Patient has scale at home: yes Patient has pill box at home: yes    High Risk Criteria for Readmission and/or Poor Patient Outcomes: Heart failure hospital admissions (last 6 months): 0  No Show rate: 17 %  Difficult social situation: No Demonstrates medication adherence: Yes Primary Language: English Literacy level: Reading, writing, and comprehension.   Barriers of Care:   Diet/ fluid restrictions.   Considerations/Referrals:   Referral made to Heart Failure Pharmacist Stewardship: yes Referral made to Heart Failure CSW/NCM TOC: No Referral made to Heart &  Vascular TOC clinic: Yes, 09/09/21 @ 10 am.   Items for Follow-up on DC/TOC: Diet/ fluid restrictions GDMT being continued   Rhae Hammock, BSN, RN Heart Failure Teacher, adult education Only

## 2021-08-27 NOTE — Progress Notes (Signed)
PROGRESS NOTE    Maria Ellis  DUK:[id number] DOB: 1985/04/07 DOA: 08/25/2021 PCP: Crist Fat, MD  [name] with history of narcotic dependence on Suboxone was admitted in March, 23 with V-fib arrest, STEMI noted to have 100% LAD occlusion treated with stenting, noted to have ischemic cardiomyopathy EF of 30%, discharged home on GDMT.  Was in her usual state of health until yesterday, reported feeling lightheaded and dizzy, blood pressures in the 76-80s and came to the ER.  Subjective: -Feels better today, dizziness denies any dizziness, blood pressure in the 90s  Assessment and Plan:  Hypotension -Etiology is not clear, no signs or symptoms of infectious process or sepsis, clinically do not suspect cardiogenic shock as well, UDS is negative -Random cortisol was low, cosyntropin stim test completed this morning, not suggestive of adrenal insufficiency, a.m. cortisol was in normal range today, rise in cortisol was 6.4 , slightly less than expected range but not remarkably low -Suspect cardiomyopathy and CHF meds could be contributing -On Farxiga Toprol and Aldactone at baseline,, BP improving 90s now still soft, restarted Aldactone at low-dose -Ambulate, home tomorrow if stable, close follow-up in [facility] clinic to resume further GDMT as blood pressure tolerates   CAD -Recent LAD stent in 3/23, continue aspirin Brilinta statin and Zetia -Holding Toprol with low BPs   Chronic systolic CHF, ischemic cardiomyopathy -EF previously 35%, improved to 40-45% -Clinically appears euvolemic, Farxiga Toprol and Aldactone held, see discussion above   History of narcotic dependence -On Suboxone, continued   History of VP shunt -History of neonatal hydrocephalus  DVT prophylaxis: Heparin Code Status: Full code Family Communication: Discussed with patient and mother at bedside yesterday Disposition Plan: Home tomorrow if stable  Consultants:    Procedures:   Antimicrobials:     Objective: Vitals:   08/26/21 1936 08/26/21 2351 08/27/21 0413 08/27/21 0821  BP: 104/60 (!) 94/53 (!) 95/58 (!) 100/57  Pulse: 60 61 65 62  Resp: 16 16 16 16   Temp: 99.2 F (37.3 C) 98.7 F (37.1 C) 99.3 F (37.4 C) 98.4 F (36.9 C)  TempSrc: Oral Oral Oral Oral  SpO2: 98% 96% 95% 96%  Weight:      Height:       No intake or output data in the 24 hours ending 08/27/21 1438 Filed Weights   08/25/21 1910 08/26/21 0211  Weight: 75.8 kg 80.6 kg    Examination:  General exam: Appears calm and comfortable  Respiratory system: Clear to auscultation Cardiovascular system: S1 & S2 heard, RRR.  Abd: nondistended, soft and nontender.Normal bowel sounds heard. Central nervous system: Alert and oriented. No focal neurological deficits. Extremities: no edema Skin: No rashes Psychiatry:  Mood & affect appropriate.     Data Reviewed:   CBC: Recent Labs  Lab 08/25/21 1915 08/26/21 0325 08/27/21 0806  WBC 11.9* 9.2 6.6  NEUTROABS 7.3  --   --   HGB 14.0 12.5 13.7  HCT 41.5 37.2 41.1  MCV 82.8 83.6 83.9  PLT 244 204 211   Basic Metabolic Panel: Recent Labs  Lab 08/25/21 1915 08/26/21 0325 08/27/21 0806  NA 134* 138 140  K 3.4* 3.7 3.7  CL 104 110 109  CO2 24 25 23   GLUCOSE 106* 81 115*  BUN 16 13 11   CREATININE 0.73 0.69 0.70  CALCIUM 8.9 8.7* 9.0   GFR: Estimated Creatinine Clearance: 97.8 mL/min (by C-G formula based on SCr of 0.7 mg/dL). Liver Function Tests: Recent Labs  Lab 08/25/21 1915 08/26/21 0325  08/27/21 0806  AST 25 18 23   ALT 22 17 20   ALKPHOS 74 66 72  BILITOT 0.7 0.5 0.3  PROT 7.2 5.9* 6.7  ALBUMIN 4.3 3.6 3.9   Recent Labs  Lab 08/25/21 1915  LIPASE 31   No results for input(s): "AMMONIA" in the last 168 hours. Coagulation Profile: No results for input(s): "INR", "PROTIME" in the last 168 hours. Cardiac Enzymes: No results for input(s): "CKTOTAL", "CKMB", "CKMBINDEX", "TROPONINI" in the last 168 hours. BNP (last 3  results) Recent Labs    05/15/21 1106  PROBNP 352*   HbA1C: No results for input(s): "HGBA1C" in the last 72 hours. CBG: Recent Labs  Lab 08/25/21 1920  GLUCAP 117*   Lipid Profile: No results for input(s): "CHOL", "HDL", "LDLCALC", "TRIG", "CHOLHDL", "LDLDIRECT" in the last 72 hours. Thyroid Function Tests: No results for input(s): "TSH", "T4TOTAL", "FREET4", "T3FREE", "THYROIDAB" in the last 72 hours. Anemia Panel: No results for input(s): "VITAMINB12", "FOLATE", "FERRITIN", "TIBC", "IRON", "RETICCTPCT" in the last 72 hours. Urine analysis:    Component Value Date/Time   COLORURINE YELLOW 08/25/2021 1923   APPEARANCEUR CLEAR 08/25/2021 1923   LABSPEC 1.010 08/25/2021 1923   PHURINE 5.0 08/25/2021 1923   GLUCOSEU >=500 (A) 08/25/2021 1923   HGBUR TRACE (A) 08/25/2021 1923   BILIRUBINUR NEGATIVE 08/25/2021 1923   KETONESUR NEGATIVE 08/25/2021 1923   PROTEINUR NEGATIVE 08/25/2021 1923   UROBILINOGEN 0.2 05/17/2013 2000   NITRITE NEGATIVE 08/25/2021 1923   LEUKOCYTESUR NEGATIVE 08/25/2021 1923   Sepsis Labs: @LABRCNTIP (procalcitonin:4,lacticidven:4)  )No results found for this or any previous visit (from the past 240 hour(s)).   Radiology Studies: No results found.   Scheduled Meds:  aspirin  81 mg Oral Daily   buprenorphine-naloxone  1 tablet Sublingual Daily   ezetimibe  10 mg Oral Daily   heparin  5,000 Units Subcutaneous Q8H   rosuvastatin  40 mg Oral Daily   spironolactone  12.5 mg Oral Daily   ticagrelor  90 mg Oral BID   topiramate  50 mg Oral Daily   Continuous Infusions:   LOS: 0 days    Time spent: 10/25/2021  10/25/2021, MD [facility]   08/27/2021, 2:38 PM

## 2021-08-28 DIAGNOSIS — I959 Hypotension, unspecified: Secondary | ICD-10-CM | POA: Diagnosis not present

## 2021-08-28 DIAGNOSIS — R55 Syncope and collapse: Secondary | ICD-10-CM | POA: Diagnosis not present

## 2021-08-28 LAB — BASIC METABOLIC PANEL
Anion gap: 9 (ref 5–15)
BUN: 12 mg/dL (ref 6–20)
CO2: 22 mmol/L (ref 22–32)
Calcium: 9.1 mg/dL (ref 8.9–10.3)
Chloride: 109 mmol/L (ref 98–111)
Creatinine, Ser: 0.72 mg/dL (ref 0.44–1.00)
GFR, Estimated: >60 mL/min (ref >60)
Glucose, Bld: 94 mg/dL (ref 70–99)
Potassium: 4 mmol/L (ref 3.5–5.1)
Sodium: 140 mmol/L (ref 135–145)

## 2021-09-03 ENCOUNTER — Ambulatory Visit
Admission: RE | Admit: 2021-09-03 | Discharge: 2021-09-03 | Disposition: A | Discharge status: Home / Self Care | Payer: Medicare Other | Source: Ambulatory Visit | Attending: Unknown Physician Specialty | Admitting: Unknown Physician Specialty

## 2021-09-03 DIAGNOSIS — Z8719 Personal history of other diseases of the digestive system: Secondary | ICD-10-CM | POA: Diagnosis not present

## 2021-09-03 DIAGNOSIS — R195 Other fecal abnormalities: Secondary | ICD-10-CM

## 2021-09-03 DIAGNOSIS — Z9049 Acquired absence of other specified parts of digestive tract: Secondary | ICD-10-CM | POA: Diagnosis not present

## 2021-09-03 DIAGNOSIS — K635 Polyp of colon: Secondary | ICD-10-CM | POA: Diagnosis not present

## 2021-09-05 NOTE — Discharge Summary (Addendum)
Physician Discharge Summary  Maria Ellis [id number]:[id number] DOB: [birth year] DOA: 08/25/2021  PCP: Crist Fat, MD  Admit date: 08/25/2021 Discharge date: 08/28/2021  Time spent: 35 minutes  Recommendations for Outpatient Follow-up:  CHF TOC clinic on 8/21-restart Farxiga/losartan if BP tolerates Cardiology Dr. Dulce Sellar in 1 month   Discharge Diagnoses:  Principal Problem:   Hypotension Chronic systolic CHF Ischemic cardiomyopathy CAD   S/P VP shunt   ST elevation myocardial infarction (STEMI) (HCC)   Spina bifida with hydrocephalus (HCC)   Near syncope   Discharge Condition: Improved  Diet recommendation: Low sodium, heart healthy  Filed Weights   08/25/21 1910 08/26/21 0211  Weight: 75.8 kg 80.6 kg    History of present illness:  [name] with history of narcotic dependence on Suboxone was admitted in March, 23 with V-fib arrest, STEMI noted to have 100% LAD occlusion treated with stenting, noted to have ischemic cardiomyopathy EF of 30%, discharged home on GDMT.  Was in her usual state of health until yesterday, reported feeling lightheaded and dizzy, blood pressures in the 76-80s and came to the ER  Hospital Course:   Hypotension -Symptomatic with BP in the 75-80's range on admission  -no signs or symptoms of infectious process or sepsis, clinically do not suspect cardiogenic shock as well, UDS is negative -Random cortisol was low, cosyntropin stim test completed yesterday morning, not suggestive of adrenal insufficiency, a.m. cortisol was in normal range yesterday, rise in cortisol was 6.4 , slightly less than expected range but not remarkably low -Suspect cardiomyopathy and CHF meds could be contributing -Hydrated on admission -On Farxiga Toprol and Aldactone at baseline,, BP improving 90s range now, restarted Aldactone at low-dose and low-dose Toprol at discharge, blood pressure in the 95-100 range now -Discharged home in a stable condition, TOC follow-up arranged,  Farxiga/ARB to be resumed at follow-up if blood pressure is stable   CAD -Recent LAD stent in 3/23, continue aspirin Brilinta statin and Zetia -Recent added Toprol at discharge today   Chronic systolic CHF, ischemic cardiomyopathy -EF previously 35%, improved to 40-45% -Clinically appears euvolemic, Aldactone and Toprol resumed  -Farxiga held at discharge, can be resumed at follow-up if BP is stable   History of narcotic dependence -On Suboxone, continued   History of VP shunt -History of neonatal hydrocephalus  Discharge Exam: Vitals:   08/28/21 0601 08/28/21 0805  BP: (!) 100/58 104/65  Pulse: (!) 51 67  Resp: 15 15  Temp: 99.2 F (37.3 C) 98.4 F (36.9 C)  SpO2: 97% 98%   General exam: Appears calm and comfortable  Respiratory system: Clear to auscultation Cardiovascular system: S1 & S2 heard, RRR.  Abd: nondistended, soft and nontender.Normal bowel sounds heard. Central nervous system: Alert and oriented. No focal neurological deficits. Extremities: no edema Skin: No rashes Psychiatry:  Mood & affect appropriate.    Discharge Instructions   Discharge Instructions     Diet - low sodium heart healthy   Complete by: As directed    Diet - low sodium heart healthy   Complete by: As directed    Increase activity slowly   Complete by: As directed    Increase activity slowly   Complete by: As directed       Allergies as of 08/28/2021       Reactions   Penicillins Anaphylaxis, Swelling      Iodinated Contrast Media Other (See Comments)   Body felt like it was on fire.   Multihance [gadobenate] Nausea Only, Cough  PT HAD SNEEZING AND NAUSEA IMMEDIATELY AFTER CONTRAST INJECTION.    Tylenol [acetaminophen] Rash, Other (See Comments)   Stomach cramping        Medication List     STOP taking these medications    dapagliflozin propanediol 10 MG Tabs tablet Commonly known as: FARXIGA   doxycycline 100 MG tablet Commonly known as: VIBRA-TABS    ibuprofen 200 MG tablet Commonly known as: ADVIL   losartan 25 MG tablet Commonly known as: COZAAR         TAKE these medications    Aimovig 70 MG/ML Soaj Generic drug: Erenumab-aooe Apply 70 mg topically every 30 (thirty) days.   aspirin 81 MG chewable tablet Chew 1 tablet (81 mg total) by mouth daily.   citalopram 40 MG tablet Commonly known as: CELEXA Take 40 mg by mouth daily.   ezetimibe 10 MG tablet Commonly known as: ZETIA Take 1 tablet (10 mg total) by mouth daily.   famotidine 20 MG tablet Commonly known as: PEPCID Take 20 mg by mouth at bedtime.   fluticasone 50 MCG/ACT nasal spray Commonly known as: FLONASE Place 1 spray into both nostrils in the morning and at bedtime.   Hydrocortisone Anti-Itch 1 % Generic drug: hydrocortisone cream Apply 1 Application topically 2 (two) times daily as needed for itching.   nitroGLYCERIN 0.4 MG SL tablet Commonly known as: NITROSTAT Place 1 tablet (0.4 mg total) under the tongue every 5 (five) minutes as needed for chest pain.   olopatadine 0.1 % ophthalmic solution Commonly known as: PATANOL Place 1 drop into both eyes 2 (two) times daily as needed for allergies.   omeprazole 20 MG capsule Commonly known as: PRILOSEC Take 1 capsule (20 mg total) by mouth daily.   ondansetron 4 MG disintegrating tablet Commonly known as: ZOFRAN-ODT Take 4 mg by mouth 2 (two) times daily as needed for nausea/vomiting.   PRESCRIPTION MEDICATION Take 1 tablet by mouth as needed (migraine). Nurtec - Samples   ProAir HFA 108 (90 Base) MCG/ACT inhaler Generic drug: albuterol Inhale 2 puffs into the lungs every 4 (four) hours as needed for wheezing or shortness of breath.   rizatriptan 10 MG disintegrating tablet Commonly known as: Maxalt-MLT Take 1 tablet (10 mg total) by mouth as needed. May repeat in 2 hours if needed What changed: reasons to take this   rosuvastatin 40 MG tablet Commonly known as: CRESTOR Take 1 tablet  (40 mg total) by mouth daily.   spironolactone 25 MG tablet Commonly known as: ALDACTONE Take 12.5 mg by mouth daily.   Suboxone 8-2 MG Film Generic drug: Buprenorphine HCl-Naloxone HCl Place 1 strip under the tongue daily as needed (withdrawal).   ticagrelor 90 MG Tabs tablet Commonly known as: BRILINTA Take 1 tablet (90 mg total) by mouth 2 (two) times daily.   topiramate 50 MG tablet Commonly known as: TOPAMAX Take 50 mg by mouth daily.   Trelegy Ellipta 100-62.5-25 MCG/ACT Aepb Generic drug: Fluticasone-Umeclidin-Vilant Inhale 1 puff into the lungs daily.   Vitamin D (Ergocalciferol) 1.25 MG (50000 UNIT) Caps capsule Commonly known as: DRISDOL Take 50,000 Units by mouth every Sunday.   metoprolol succinate 25 MG 24 hr tablet Commonly known as: TOPROL-XL Take 25 Mg daily       Allergies  Allergen Reactions   Penicillins Anaphylaxis and Swelling        Iodinated Contrast Media Other (See Comments)    Body felt like it was on fire.   Multihance [Gadobenate] Nausea Only and Cough  PT HAD SNEEZING AND NAUSEA IMMEDIATELY AFTER CONTRAST INJECTION.     Tylenol [Acetaminophen] Rash and Other (See Comments)    Stomach cramping    Follow-up Information     [facility]. Go in 12 day(s).   Specialty: Cardiology Why: Hospital follow up PLEASE bring a current medication list to appointment FREE valet parking, Entrance C, off [employer] information: [address] [id number] [place] [phone]                 The results of significant diagnostics from this hospitalization (including imaging, microbiology, ancillary and laboratory) are listed below for reference.    Significant Diagnostic Studies: CT VIRTUAL COLONOSCOPY DIAGNOSTIC  Result Date: 09/04/2021 CLINICAL DATA:  Occult GI bleeding for several months. EXAM: CT VIRTUAL COLONOSCOPY DIAGNOSTIC TECHNIQUE:  The patient was given a standard LoSo bowel preparation with Gastrografin and barium for fluid and stool tagging respectively. The quality of the bowel preparation is moderate with retained fluid. Automated CO2 insufflation of the colon was performed prior to image acquisition and colonic distention is excellent. Image post processing was used to generate a 3D endoluminal fly-through projection of the colon and to electronically subtract stool/fluid as appropriate. COMPARISON:  CT abdomen pelvis 10/15/2020. FINDINGS: VIRTUAL COLONOSCOPY 8 mm polyp in the distal ascending colon (supine series 4, image 89 and prone series 11, image 94). No mass or stricture. Virtual colonoscopy is not designed to detect diminutive polyps (i.e., less than or equal to 5 mm), the presence or absence of which may not affect clinical management. CT ABDOMEN AND PELVIS WITHOUT CONTRAST Lower chest: Lung bases are clear. Heart size normal. No pericardial or pleural effusion. There may be distal esophageal wall thickening which can be seen with gastroesophageal reflux. Hepatobiliary: Liver and gallbladder are unremarkable. No biliary ductal dilatation. Pancreas: Negative. Spleen: Negative. Adrenals/Urinary Tract: Adrenal glands and kidneys are unremarkable. Ureters are decompressed. Bladder is very low in volume. Stomach/Bowel: Stomach and small bowel are unremarkable. Appendectomy. Colon is discussed in the virtual colonoscopy section of this report. Vascular/Lymphatic: Vascular structures are unremarkable. No pathologically enlarged lymph nodes. Reproductive: Uterus is visualized.  No adnexal mass. Other: No free fluid.  Mesenteries and peritoneum are unremarkable. Musculoskeletal: Degenerative changes in the spine. No worrisome lytic or sclerotic lesions. IMPRESSION: 8 mm polyp in the distal ascending colon.  No stricture or mass. Electronically Signed   By: Lorin Picket M.D.   On: 09/04/2021 09:50    Microbiology: No results found  for this or any previous visit (from the past 240 hour(s)).   Labs: Basic Metabolic Panel: No results for input(s): "NA", "K", "CL", "CO2", "GLUCOSE", "BUN", "CREATININE", "CALCIUM", "MG", "PHOS" in the last 168 hours. Liver Function Tests: No results for input(s): "AST", "ALT", "ALKPHOS", "BILITOT", "PROT", "ALBUMIN" in the last 168 hours. No results for input(s): "LIPASE", "AMYLASE" in the last 168 hours. No results for input(s): "AMMONIA" in the last 168 hours. CBC: No results for input(s): "WBC", "NEUTROABS", "HGB", "HCT", "MCV", "PLT" in the last 168 hours. Cardiac Enzymes: No results for input(s): "CKTOTAL", "CKMB", "CKMBINDEX", "TROPONINI" in the last 168 hours. BNP: BNP (last 3 results) Recent Labs    04/01/21 1007 07/29/21 1627  BNP 117.2* 37.6    ProBNP (last 3 results) Recent Labs    05/15/21 1106  PROBNP 352*    CBG: No results for input(s): "GLUCAP" in the last 168 hours.     Signed:  Domenic Polite MD.  [facility] 09/05/2021, 1:27 PM

## 2021-09-09 ENCOUNTER — Ambulatory Visit (HOSPITAL_COMMUNITY)
Admit: 2021-09-09 | Discharge: 2021-09-09 | Disposition: A | Discharge status: Home / Self Care | Payer: Medicare Other | Attending: Adult Health | Admitting: Adult Health

## 2021-09-09 ENCOUNTER — Encounter (HOSPITAL_COMMUNITY): Payer: Self-pay

## 2021-09-09 ENCOUNTER — Telehealth (HOSPITAL_COMMUNITY): Payer: Self-pay | Admitting: *Deleted

## 2021-09-09 VITALS — BP 100/58 | HR 69 | Wt 175.0 lb

## 2021-09-09 DIAGNOSIS — Z7902 Long term (current) use of antithrombotics/antiplatelets: Secondary | ICD-10-CM | POA: Insufficient documentation

## 2021-09-09 DIAGNOSIS — Z9861 Coronary angioplasty status: Secondary | ICD-10-CM | POA: Diagnosis not present

## 2021-09-09 DIAGNOSIS — Z7984 Long term (current) use of oral hypoglycemic drugs: Secondary | ICD-10-CM | POA: Diagnosis not present

## 2021-09-09 DIAGNOSIS — I959 Hypotension, unspecified: Secondary | ICD-10-CM | POA: Insufficient documentation

## 2021-09-09 DIAGNOSIS — Z982 Presence of cerebrospinal fluid drainage device: Secondary | ICD-10-CM | POA: Insufficient documentation

## 2021-09-09 DIAGNOSIS — I5022 Chronic systolic (congestive) heart failure: Secondary | ICD-10-CM | POA: Insufficient documentation

## 2021-09-09 DIAGNOSIS — Z7982 Long term (current) use of aspirin: Secondary | ICD-10-CM | POA: Insufficient documentation

## 2021-09-09 DIAGNOSIS — Z955 Presence of coronary angioplasty implant and graft: Secondary | ICD-10-CM | POA: Diagnosis not present

## 2021-09-09 DIAGNOSIS — R55 Syncope and collapse: Secondary | ICD-10-CM | POA: Insufficient documentation

## 2021-09-09 DIAGNOSIS — I4901 Ventricular fibrillation: Secondary | ICD-10-CM | POA: Insufficient documentation

## 2021-09-09 DIAGNOSIS — I255 Ischemic cardiomyopathy: Secondary | ICD-10-CM | POA: Diagnosis not present

## 2021-09-09 DIAGNOSIS — I2510 Atherosclerotic heart disease of native coronary artery without angina pectoris: Secondary | ICD-10-CM | POA: Diagnosis not present

## 2021-09-09 DIAGNOSIS — Z79899 Other long term (current) drug therapy: Secondary | ICD-10-CM | POA: Diagnosis not present

## 2021-09-09 LAB — BASIC METABOLIC PANEL
Anion gap: 7 (ref 5–15)
BUN: 5 mg/dL — ABNORMAL LOW (ref 6–20)
CO2: 22 mmol/L (ref 22–32)
Calcium: 9 mg/dL (ref 8.9–10.3)
Chloride: 109 mmol/L (ref 98–111)
Creatinine, Ser: 0.69 mg/dL (ref 0.44–1.00)
GFR, Estimated: >60 mL/min (ref >60)
Glucose, Bld: 95 mg/dL (ref 70–99)
Potassium: 4 mmol/L (ref 3.5–5.1)
Sodium: 138 mmol/L (ref 135–145)

## 2021-09-09 NOTE — Patient Instructions (Signed)
RESTART Farxiga 10 mg daily.  Labs done today, your results will be available in MyChart, we will contact you for abnormal readings.  Thank you for allowing [place] to provider your heart failure care after your recent hospitalization. Please follow-up with Dr. Dulce Sellar as Scheduled.  If you have any questions, issues, or concerns before your next appointment please call our office at [phone], opt. [phone] and leave a message for the triage nurse.

## 2021-09-09 NOTE — Progress Notes (Signed)
HEART & VASCULAR TRANSITION OF CARE Follow up     Referring Physician: Dr Jomarie Longs Primary Care: Dr Salley Scarlet  Primary Cardiologist: Dr Dulce Sellar   HPI: Referred to clinic by Dr Jomarie Longs for heart failure consultation.   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 CAD, VF arrest,  ICM, and chronic HFrEF.  Admitted 03/2021 with V-fib arrest, STEMI noted to have 100% LAD occlusion treated with stenting.  Echo EF of 30-35%.  She was placed on DAPT w/ ASA + Brilinta, high intensity statin w/ Crestor 40, Farxiga 10, Losartan 12.5 and Toprol XL 12.5 mg daily. She had no further VF post revascularization. No LifeVest at discharge.   She had visit in [facility] clinic in [facility] with recommendations for f/u with Dr Dulce Sellar.   Had Echo in May 05/2021, EF improved 40-45%.   GDMT limited by hypotension.    Admitted 08/25/2021 with hypotension and presyncope.  HF meds stopped on admit. MRA later restarted. Farxiga/Metoprolol were not restarted.  Echo EF improved 40-45% .  Overall feeling fine. No longer dizzy. Denies SOB/PND/Orthopnea. + Bendopnea. No chest pain. Appetite ok. No fever or chills. Weight at home  171-174 pounds. Taking all medications. Lives with her fiance. She is not using contraceptives and has had 3 miscarriages. Disabled due to shunt.    Cardiac Testing  Echo 05/2021 EF 40-45% Echo 03/2021 EF 30-35%   Cath 03/20/2021  DES Prox LAD -Mid LAD otherwise normal RCA/Cx  Review of Systems: [y] = yes, [ ]  = no   General: Weight gain [ ] ; Weight loss [ ] ; Anorexia [ ] ; Fatigue [ ] ; Fever [ ] ; Chills [ ] ; Weakness [ ]   Cardiac: Chest pain/pressure [ ] ; Resting SOB [ ] ; Exertional SOB [ ] ; Orthopnea [ ] ; Pedal Edema [ ] ; Palpitations [ ] ; Syncope [ ] ; Presyncope [ ] ; Paroxysmal nocturnal dyspnea[ ]   Pulmonary: Cough [ ] ; Wheezing[ ] ; Hemoptysis[ ] ; Sputum [ ] ; Snoring [ ]   GI: Vomiting[ ] ; Dysphagia[ ] ; Melena[ ] ; Hematochezia [ ] ;  Heartburn[ ] ; Abdominal pain [ ] ; Constipation [ ] ; Diarrhea [ ] ; BRBPR [ ]   GU: Hematuria[ ] ; Dysuria [ ] ; Nocturia[ ]   Vascular: Pain in legs with walking [ ] ; Pain in feet with lying flat [ ] ; Non-healing sores [ ] ; Stroke [ ] ; TIA [ ] ; Slurred speech [ ] ;  Neuro: Headaches[ ] ; Vertigo[ ] ; Seizures[ ] ; Paresthesias[ ] ;Blurred vision [ ] ; Diplopia [ ] ; Vision changes [ ]   Ortho/Skin: Arthritis [ ] ; Joint pain [ Y]; Muscle pain [ ] ; Joint swelling [ ] ; Back Pain [ Y]; Rash [ ]   Psych: Depression[ ] ; Anxiety[ ]   Heme: Bleeding problems [ ] ; Clotting disorders [ ] ; Anemia [ ]   Endocrine: Diabetes [ ] ; Thyroid dysfunction[ ]    Past Medical History:  Diagnosis Date   Bell's palsy    Headache    High cholesterol    History of Chiari malformation    Hydrocephalus (HCC)    Myocardial infarct (HCC)     Current Outpatient Medications  Medication Sig Dispense Refill   AIMOVIG 70 MG/ML SOAJ Apply 70 mg topically every 30 (thirty) days.     aspirin 81 MG chewable tablet Chew 1 tablet (81 mg total) by mouth daily.     Cholecalciferol (VITAMIN D3) 1.25 MG (50000 UT) CAPS Take 1 capsule by mouth every 30 (thirty) days.     citalopram (CELEXA) 40 MG tablet  Take 40 mg by mouth daily.     ezetimibe (ZETIA) 10 MG tablet Take 1 tablet (10 mg total) by mouth daily. 90 tablet 3   famotidine (PEPCID) 20 MG tablet Take 20 mg by mouth at bedtime.     fluticasone (FLONASE) 50 MCG/ACT nasal spray Place 1 spray into both nostrils in the morning and at bedtime.  99   hydrocortisone cream (HYDROCORTISONE ANTI-ITCH) 1 % Apply 1 Application topically 2 (two) times daily as needed for itching.     nitroGLYCERIN (NITROSTAT) 0.4 MG SL tablet Place 1 tablet (0.4 mg total) under the tongue every 5 (five) minutes as needed for chest pain. 25 tablet 3   olopatadine (PATANOL) 0.1 % ophthalmic solution Place 1 drop into both eyes 2 (two) times daily as needed for allergies.     omeprazole (PRILOSEC) 20 MG capsule Take 1  capsule (20 mg total) by mouth daily. 30 capsule 0   ondansetron (ZOFRAN-ODT) 4 MG disintegrating tablet Take 4 mg by mouth 2 (two) times daily as needed for nausea/vomiting.     PRESCRIPTION MEDICATION Take 1 tablet by mouth as needed (migraine). Nurtec - Samples     rosuvastatin (CRESTOR) 40 MG tablet Take 1 tablet (40 mg total) by mouth daily. 90 tablet 3   spironolactone (ALDACTONE) 25 MG tablet Take 12.5 mg by mouth daily.     SUBOXONE 8-2 MG FILM Place 1 strip under the tongue daily as needed (withdrawal).     ticagrelor (BRILINTA) 90 MG TABS tablet Take 1 tablet (90 mg total) by mouth 2 (two) times daily. 60 tablet 11   topiramate (TOPAMAX) 50 MG tablet Take 50 mg by mouth daily.  2   TRELEGY ELLIPTA 100-62.5-25 MCG/ACT AEPB Inhale 1 puff into the lungs daily.     No current facility-administered medications for this encounter.    Allergies  Allergen Reactions   Penicillins Anaphylaxis and Swelling        Iodinated Contrast Media Other (See Comments)    Body felt like it was on fire.   Multihance [Gadobenate] Nausea Only and Cough    PT HAD SNEEZING AND NAUSEA IMMEDIATELY AFTER CONTRAST INJECTION.     Tylenol [Acetaminophen] Rash and Other (See Comments)    Stomach cramping      Social History   Socioeconomic History   Marital status: Single    Spouse name: Not on file   Number of children: 0   Years of education: 12   Highest education level: High school graduate  Occupational History   Occupation: Disabled  Tobacco Use   Smoking status: Former    Years: 21.00    Types: Cigarettes    Quit date: 03/20/2021    Years since quitting: 0.4    Passive exposure: Past   Smokeless tobacco: Never   Tobacco comments:    Quit after her MI in July 2023  Vaping Use   Vaping Use: Never used  Substance and Sexual Activity   Alcohol use: Yes    Comment: occ   Drug use: Yes    Types: Marijuana    Comment: July 2023   Sexual activity: Yes    Birth control/protection: None   Other Topics Concern   Not on file  Social History Narrative   Lives at home with her mother.   Left-handed.   8-9 cups caffeine per day.   Social Determinants of Health   Financial Resource Strain: Low Risk  (08/27/2021)   Overall Financial Resource Strain (CARDIA)  Difficulty of Paying Living Expenses: Not very hard  Food Insecurity: No Food Insecurity (08/27/2021)   Hunger Vital Sign    Worried About Running Out of Food in the Last Year: Never true    Ran Out of Food in the Last Year: Never true  Transportation Needs: No Transportation Needs (08/27/2021)   PRAPARE - Administrator, Civil Service (Medical): No    Lack of Transportation (Non-Medical): No  Physical Activity: Not on file  Stress: Not on file  Social Connections: Not on file  Intimate Partner Violence: Not on file      Family History  Problem Relation Age of Onset   Diabetes Mother    Diabetes Father    Heart failure Father    Cirrhosis Father     Vitals:   09/09/21 1001  BP: (!) 100/58  Pulse: 69  SpO2: 98%  Weight: 79.4 kg (175 lb)   Wt Readings from Last 3 Encounters:  09/09/21 79.4 kg (175 lb)  08/26/21 80.6 kg (177 lb 12.8 oz)  07/29/21 79.4 kg (175 lb)    PHYSICAL EXAM: General:  Well appearing. No respiratory difficulty HEENT: normal Neck: supple. no JVD. Carotids 2+ bilat; no bruits. No lymphadenopathy or thryomegaly appreciated. Cor: PMI nondisplaced. Regular rate & rhythm. No rubs, gallops or murmurs. Lungs: clear Abdomen: soft, nontender, nondistended. No hepatosplenomegaly. No bruits or masses. Good bowel sounds. Extremities: no cyanosis, clubbing, rash, edema Neuro: alert & oriented x 3, cranial nerves grossly intact. moves all 4 extremities w/o difficulty. Affect pleasant.     ASSESSMENT & PLAN: 1. HFmEF Echo in March 2023 EF 30-35%. In May Echo showed improving EF --> 40-45%. NYHA II. Recent admit for hypotension. GDMT limited due to hypotension.  GDMT   Diuretic-Does not need loop diuretics.  BB- Heart in the 60s . Hold off on bb Ace/ARB/ARNI- Could consider losartan at bedtime at next visit.  MRA- Continue current dose spironolactone.  SGLT2i- Restart farxiga 10 mg daily.  - Check BMET  - Discussed low salt food choices and importance of medication compliance.  - Discussed importance of avoiding pregnancy. She not want to use any form of contraception and is hopeful to get pregnant. Reinforced importance of notifying Dr Dulce Sellar    2. CAD -03/2021 DES- Prox LAD -Mid LAD No chest pain. Encouraged to resume cardiac rehab.  - On zetia, aspirin, brilinta, and crestor   Referred to HFSW (PCP, Medications, Transportation, ETOH Abuse, Drug Abuse, Insurance, Financial ): No Refer to Pharmacy:  No Refer to Home Health:  No Refer to Advanced Heart Failure Clinic: No  Refer to General Cardiology: She is established with Dr Dulce Sellar   Follow up as needed.   Lynita Groseclose NP-C  11:08 AM

## 2021-09-09 NOTE — Telephone Encounter (Signed)
Called to confirm Heart & Vascular Transitions of Care appointment at 10 am on 09/09/21. Patient reminded to bring all medications and pill box organizer with them. Confirmed patient has transportation. Gave directions, instructed to utilize valet parking.  Confirmed appointment prior to ending call.    Rhae Hammock, BSN, Scientist, clinical (histocompatibility and immunogenetics) Only

## 2021-09-11 ENCOUNTER — Telehealth: Payer: Self-pay | Admitting: Cardiology

## 2021-09-11 DIAGNOSIS — Z79899 Other long term (current) drug therapy: Secondary | ICD-10-CM | POA: Diagnosis not present

## 2021-09-11 NOTE — Telephone Encounter (Signed)
Pt states that she needs a note to resume cardiac rehab since she has been out due to low BP and resp issues. Please advise.

## 2021-09-11 NOTE — Telephone Encounter (Signed)
Pt called stating she needs a note saying she is able to exercise. Please advise.

## 2021-09-13 DIAGNOSIS — G4733 Obstructive sleep apnea (adult) (pediatric): Secondary | ICD-10-CM | POA: Diagnosis not present

## 2021-09-16 NOTE — Telephone Encounter (Signed)
   Pt is calling to f/u. Advised Dr. Dulce Sellar will be back on Wednesday. She ask if there's any provider covering Dr. Dulce Sellar can hep her with th note. She said she really wants to start working out

## 2021-09-20 NOTE — Telephone Encounter (Signed)
Note faxed to [facility] Cardiac rehab, ok for patient to exercise per Dr Dulce Sellar

## 2021-09-20 NOTE — Telephone Encounter (Signed)
Informed patient per Dr Dulce Sellar ok for her to go to Cardiac Rehab for exercise. Patient voices understanding and request note be faxed to [facility] rehab

## 2021-09-26 DIAGNOSIS — J4520 Mild intermittent asthma, uncomplicated: Secondary | ICD-10-CM | POA: Diagnosis not present

## 2021-09-26 DIAGNOSIS — G4733 Obstructive sleep apnea (adult) (pediatric): Secondary | ICD-10-CM | POA: Diagnosis not present

## 2021-09-26 DIAGNOSIS — G4734 Idiopathic sleep related nonobstructive alveolar hypoventilation: Secondary | ICD-10-CM | POA: Diagnosis not present

## 2021-09-26 DIAGNOSIS — R5383 Other fatigue: Secondary | ICD-10-CM | POA: Diagnosis not present

## 2021-09-26 DIAGNOSIS — G2581 Restless legs syndrome: Secondary | ICD-10-CM | POA: Diagnosis not present

## 2021-09-27 ENCOUNTER — Ambulatory Visit: Payer: Medicare Other | Attending: Cardiology | Admitting: Cardiology

## 2021-09-27 ENCOUNTER — Encounter: Payer: Self-pay | Admitting: Cardiology

## 2021-09-27 VITALS — BP 98/66 | HR 68 | Ht 63.0 in | Wt 172.6 lb

## 2021-09-27 DIAGNOSIS — Z9861 Coronary angioplasty status: Secondary | ICD-10-CM | POA: Diagnosis not present

## 2021-09-27 DIAGNOSIS — I255 Ischemic cardiomyopathy: Secondary | ICD-10-CM

## 2021-09-27 DIAGNOSIS — E7801 Familial hypercholesterolemia: Secondary | ICD-10-CM | POA: Diagnosis not present

## 2021-09-27 DIAGNOSIS — I5022 Chronic systolic (congestive) heart failure: Secondary | ICD-10-CM

## 2021-09-27 DIAGNOSIS — I2510 Atherosclerotic heart disease of native coronary artery without angina pectoris: Secondary | ICD-10-CM

## 2021-09-27 NOTE — Progress Notes (Signed)
Cardiology Office Note:    Date:  09/27/2021   ID:  Maria Ellis, DOB Sep 20, 1985, MRN [id number]  PCP:  Crist Fat, MD  Cardiologist:  Norman Herrlich, MD    Referring MD: Eunice Blase, PA-C    ASSESSMENT:    1. Ischemic cardiomyopathy   2. Chronic systolic heart failure (HCC)   3. CAD S/P percutaneous coronary angioplasty   4. Familial hypercholesterolemia    PLAN:    In order of problems listed above:  Her she had a significant improvement in her ejection fraction last echocardiogram subsequently developed symptomatic hypotension no clear-cut precipitant not on a beta-blocker but is back on her other guideline directed therapy with spironolactone and SGLT2 inhibitor.  She will continue to trend her home blood pressures and will be closely watched in our cardiac rehabilitation program Stable no fluid overload presently not on a loop diuretic Well controlled she will continue her combination high intensity statin along with Zetia   Next appointment: 3 months   Medication Adjustments/Labs and Tests Ordered: Current medicines are reviewed at length with the patient today.  Concerns regarding medicines are outlined above.  No orders of the defined types were placed in this encounter.  No orders of the defined types were placed in this encounter.   Chief Complaint  Patient presents with   Follow-up   Cardiomyopathy   Coronary Artery Disease   Congestive Heart Failure   Hypotension    History of Present Illness:    Maria Ellis is a 36 y.o. female with a hx of coronary artery disease ischemic cardiomyopathy systolic heart failure familial hyperlipidemia hydrocephalus with Chiari malformation and presentation with out-of-hospital VF arrest with successful resuscitation urgent catheterization and PCI and stent [facility] 03/24/2021 who wants last seen 06/27/2021.  Her most recent ejection fraction 06/15/1998 2340 to 45% on good guideline directed medical  therapy.  She had recent [facility] admission 08/25/2021 with weakness and hypotension.  Multiple cardiac medications were and subsequently MRI and SGLT2 inhibitor were resumed.    Compliance with diet, lifestyle and medications: Yes Since discharge she has done well and her home blood pressure has been running between 104-124/68-72 He is back on Farxiga and spironolactone She is not taking beta-blocker She is to resume cardiac rehabilitation Is had no recurrent lightheadedness syncope hypotension shortness of breath or edema. She also has sleep apnea awaiting CPAP titration Her weight is down 8 pounds since hospitalization Past Medical History:  Diagnosis Date   Bell's palsy    Headache    High cholesterol    History of Chiari malformation    Hydrocephalus (HCC)    Myocardial infarct [facility])     Past Surgical History:  Procedure Laterality Date   BRAIN SURGERY     CORONARY ANGIOPLASTY WITH STENT PLACEMENT     CORONARY/GRAFT ACUTE MI REVASCULARIZATION N/A 03/20/2021   Procedure: Coronary/Graft Acute MI Revascularization;  Surgeon: Marykay Lex, MD;  Location: [facility] INVASIVE CV LAB;  Service: Cardiovascular;  Laterality: N/A;   LAPAROSCOPIC APPENDECTOMY Left 05/18/2013   Procedure: APPENDECTOMY LAPAROSCOPIC;  Surgeon: Velora Heckler, MD;  Location: [facility] ORS;  Service: General;  Laterality: Left;   LEFT HEART CATH AND CORONARY ANGIOGRAPHY N/A 03/20/2021   Procedure: LEFT HEART CATH AND CORONARY ANGIOGRAPHY;  Surgeon: Marykay Lex, MD;  Location: [facility] INVASIVE CV LAB;  Service: Cardiovascular;  Laterality: N/A;   MULTIPLE TOOTH EXTRACTIONS     SHUNT REPLACEMENT     x 2  TONSILECTOMY, ADENOIDECTOMY, BILATERAL MYRINGOTOMY AND TUBES      Current Medications: Current Meds  Medication Sig   AIMOVIG 70 MG/ML SOAJ Apply 70 mg topically every 30 (thirty) days.   aspirin 81 MG chewable tablet Chew 1 tablet (81 mg total) by mouth daily.   Cholecalciferol (VITAMIN D3) 1.25 MG  (50000 UT) CAPS Take 1 capsule by mouth every 30 (thirty) days.   citalopram (CELEXA) 40 MG tablet Take 40 mg by mouth daily.   ezetimibe (ZETIA) 10 MG tablet Take 1 tablet (10 mg total) by mouth daily.   famotidine (PEPCID) 20 MG tablet Take 20 mg by mouth at bedtime.   FARXIGA 10 MG TABS tablet Take 10 mg by mouth daily.   fluticasone (FLONASE) 50 MCG/ACT nasal spray Place 1 spray into both nostrils in the morning and at bedtime.   hydrocortisone cream (HYDROCORTISONE ANTI-ITCH) 1 % Apply 1 Application topically 2 (two) times daily as needed for itching.   nitroGLYCERIN (NITROSTAT) 0.4 MG SL tablet Place 1 tablet (0.4 mg total) under the tongue every 5 (five) minutes as needed for chest pain.   olopatadine (PATANOL) 0.1 % ophthalmic solution Place 1 drop into both eyes 2 (two) times daily as needed for allergies.   ondansetron (ZOFRAN-ODT) 4 MG disintegrating tablet Take 4 mg by mouth 2 (two) times daily as needed for nausea/vomiting.   PRESCRIPTION MEDICATION Take 1 tablet by mouth as needed (migraine). Nurtec - Samples   rosuvastatin (CRESTOR) 40 MG tablet Take 1 tablet (40 mg total) by mouth daily.   spironolactone (ALDACTONE) 25 MG tablet Take 12.5 mg by mouth daily.   SUBOXONE 8-2 MG FILM Place 1 strip under the tongue daily as needed (withdrawal).   ticagrelor (BRILINTA) 90 MG TABS tablet Take 1 tablet (90 mg total) by mouth 2 (two) times daily.   topiramate (TOPAMAX) 50 MG tablet Take 50 mg by mouth daily.   TRELEGY ELLIPTA 100-62.5-25 MCG/ACT AEPB Inhale 1 puff into the lungs daily.     Allergies:   Penicillins, Iodinated contrast media, Multihance [gadobenate], and Tylenol [acetaminophen]   Social History   Socioeconomic History   Marital status: Single    Spouse name: Not on file   Number of children: 0   Years of education: 12   Highest education level: High school graduate  Occupational History   Occupation: Disabled  Tobacco Use   Smoking status: Former    Years: 21.00     Types: Cigarettes    Quit date: 03/20/2021    Years since quitting: 0.5    Passive exposure: Past   Smokeless tobacco: Never   Tobacco comments:    Quit after her MI in July 2023  Vaping Use   Vaping Use: Never used  Substance and Sexual Activity   Alcohol use: Yes    Comment: occ   Drug use: Yes    Types: Marijuana    Comment: July 2023   Sexual activity: Yes    Birth control/protection: None  Other Topics Concern   Not on file  Social History Narrative   Lives at home with her mother.   Left-handed.   8-9 cups caffeine per day.   Social Determinants of Health   Financial Resource Strain: Low Risk  (08/27/2021)   Overall Financial Resource Strain (CARDIA)    Difficulty of Paying Living Expenses: Not very hard  Food Insecurity: No Food Insecurity (08/27/2021)   Hunger Vital Sign    Worried About Running Out of Food in the Last  Year: Never true    Ran Out of Food in the Last Year: Never true  Transportation Needs: No Transportation Needs (08/27/2021)   PRAPARE - Administrator, Civil Service (Medical): No    Lack of Transportation (Non-Medical): No  Physical Activity: Not on file  Stress: Not on file  Social Connections: Not on file     Family History: The patient's family history includes Cirrhosis in her father; Diabetes in her father and mother; Heart failure in her father. ROS:   Please see the history of present illness.    All other systems reviewed and are negative.  EKGs/Labs/Other Studies Reviewed:    The following studies were reviewed today:  EKG:  EKG performed inpatient [facility] 08/27/2021 showed old anterior MI sinus rhythm 61 bpm independently reviewed  Recent Labs: 03/21/2021: Magnesium 2.2 05/15/2021: NT-Pro BNP 352 07/29/2021: B Natriuretic Peptide 37.6 08/27/2021: ALT 20; Hemoglobin 13.7; Platelets 211 09/09/2021: BUN 5; Creatinine, Ser 0.69; Potassium 4.0; Sodium 138  Recent Lipid Panel    Component Value Date/Time   CHOL  104 05/15/2021 1106   TRIG 118 05/15/2021 1106   HDL 47 05/15/2021 1106   CHOLHDL 2.2 05/15/2021 1106   CHOLHDL 4.4 03/20/2021 0526   VLDL 7 03/20/2021 0526   LDLCALC 36 05/15/2021 1106    Physical Exam:    VS:  BP 98/66 (BP Location: Right Arm, Patient Position: Sitting)   Pulse 68   Ht 5\' 3"  (1.6 m)   Wt 172 lb 9.6 oz (78.3 kg)   SpO2 97%   BMI 30.57 kg/m     Wt Readings from Last 3 Encounters:  09/27/21 172 lb 9.6 oz (78.3 kg)  09/09/21 175 lb (79.4 kg)  08/26/21 177 lb 12.8 oz (80.6 kg)     GEN:  Well nourished, well developed in no acute distress HEENT: Normal NECK: No JVD; No carotid bruits LYMPHATICS: No lymphadenopathy CARDIAC: RRR, no murmurs, rubs, gallops RESPIRATORY:  Clear to auscultation without rales, wheezing or rhonchi  ABDOMEN: Soft, non-tender, non-distended MUSCULOSKELETAL:  No edema; No deformity  SKIN: Warm and dry NEUROLOGIC:  Alert and oriented x 3 PSYCHIATRIC:  Normal affect    Signed, 10/26/21, MD  09/27/2021 1:40 PM    [facility]

## 2021-09-27 NOTE — Patient Instructions (Signed)
Medication Instructions:  Your physician recommends that you continue on your current medications as directed. Please refer to the Current Medication list given to you today.  *If you need a refill on your cardiac medications before your next appointment, please call your pharmacy*   Lab Work: None If you have labs (blood work) drawn today and your tests are completely normal, you will receive your results only by: MyChart Message (if you have MyChart) OR A paper copy in the mail If you have any lab test that is abnormal or we need to change your treatment, we will call you to review the results.   Testing/Procedures: None   Follow-Up: At [facility], you and your health needs are our priority.  As part of our continuing mission to provide you with exceptional heart care, we have created designated Provider Care Teams.  These Care Teams include your primary Cardiologist (physician) and Advanced Practice Providers (APPs -  Physician Assistants and Nurse Practitioners) who all work together to provide you with the care you need, when you need it.  We recommend signing up for the patient portal called "MyChart".  Sign up information is provided on this After Visit Summary.  MyChart is used to connect with patients for Virtual Visits (Telemedicine).  Patients are able to view lab/test results, encounter notes, upcoming appointments, etc.  Non-urgent messages can be sent to your provider as well.   To learn more about what you can do with MyChart, go to [email or web address].    Your next appointment:   3 month(s)  The format for your next appointment:   In Person  Provider:   Brian Munley, MD    Other Instructions None  Important Information About Sugar       

## 2021-10-01 DIAGNOSIS — G4733 Obstructive sleep apnea (adult) (pediatric): Secondary | ICD-10-CM | POA: Diagnosis not present

## 2021-10-03 DIAGNOSIS — J4520 Mild intermittent asthma, uncomplicated: Secondary | ICD-10-CM | POA: Diagnosis not present

## 2021-10-03 DIAGNOSIS — G4733 Obstructive sleep apnea (adult) (pediatric): Secondary | ICD-10-CM | POA: Diagnosis not present

## 2021-10-03 DIAGNOSIS — G2581 Restless legs syndrome: Secondary | ICD-10-CM | POA: Diagnosis not present

## 2021-10-03 DIAGNOSIS — R5383 Other fatigue: Secondary | ICD-10-CM | POA: Diagnosis not present

## 2021-10-05 DIAGNOSIS — R051 Acute cough: Secondary | ICD-10-CM | POA: Diagnosis not present

## 2021-10-05 DIAGNOSIS — J019 Acute sinusitis, unspecified: Secondary | ICD-10-CM | POA: Diagnosis not present

## 2021-10-16 DIAGNOSIS — Z79899 Other long term (current) drug therapy: Secondary | ICD-10-CM | POA: Diagnosis not present

## 2021-10-24 DIAGNOSIS — J45909 Unspecified asthma, uncomplicated: Secondary | ICD-10-CM | POA: Diagnosis not present

## 2021-10-24 DIAGNOSIS — G4733 Obstructive sleep apnea (adult) (pediatric): Secondary | ICD-10-CM | POA: Diagnosis not present

## 2021-10-30 DIAGNOSIS — I2510 Atherosclerotic heart disease of native coronary artery without angina pectoris: Secondary | ICD-10-CM | POA: Diagnosis not present

## 2021-10-30 DIAGNOSIS — G4733 Obstructive sleep apnea (adult) (pediatric): Secondary | ICD-10-CM | POA: Diagnosis not present

## 2021-10-30 DIAGNOSIS — I5042 Chronic combined systolic (congestive) and diastolic (congestive) heart failure: Secondary | ICD-10-CM | POA: Diagnosis not present

## 2021-10-30 DIAGNOSIS — Z0000 Encounter for general adult medical examination without abnormal findings: Secondary | ICD-10-CM | POA: Diagnosis not present

## 2021-10-30 DIAGNOSIS — K219 Gastro-esophageal reflux disease without esophagitis: Secondary | ICD-10-CM | POA: Diagnosis not present

## 2021-10-30 DIAGNOSIS — I255 Ischemic cardiomyopathy: Secondary | ICD-10-CM | POA: Diagnosis not present

## 2021-10-30 DIAGNOSIS — G43909 Migraine, unspecified, not intractable, without status migrainosus: Secondary | ICD-10-CM | POA: Diagnosis not present

## 2021-10-30 DIAGNOSIS — J4530 Mild persistent asthma, uncomplicated: Secondary | ICD-10-CM | POA: Diagnosis not present

## 2021-10-30 DIAGNOSIS — Z8669 Personal history of other diseases of the nervous system and sense organs: Secondary | ICD-10-CM | POA: Diagnosis not present

## 2021-10-30 DIAGNOSIS — E559 Vitamin D deficiency, unspecified: Secondary | ICD-10-CM | POA: Diagnosis not present

## 2021-11-10 DIAGNOSIS — T700XXA Otitic barotrauma, initial encounter: Secondary | ICD-10-CM | POA: Diagnosis not present

## 2021-11-10 DIAGNOSIS — H65191 Other acute nonsuppurative otitis media, right ear: Secondary | ICD-10-CM | POA: Diagnosis not present

## 2021-11-16 ENCOUNTER — Other Ambulatory Visit: Payer: Self-pay | Admitting: Physician Assistant

## 2021-11-18 DIAGNOSIS — G4733 Obstructive sleep apnea (adult) (pediatric): Secondary | ICD-10-CM | POA: Diagnosis not present

## 2021-11-20 DIAGNOSIS — H6990 Unspecified Eustachian tube disorder, unspecified ear: Secondary | ICD-10-CM | POA: Diagnosis not present

## 2021-11-24 DIAGNOSIS — G4733 Obstructive sleep apnea (adult) (pediatric): Secondary | ICD-10-CM | POA: Diagnosis not present

## 2021-11-26 DIAGNOSIS — S60459A Superficial foreign body of unspecified finger, initial encounter: Secondary | ICD-10-CM | POA: Diagnosis not present

## 2021-11-26 DIAGNOSIS — Z23 Encounter for immunization: Secondary | ICD-10-CM | POA: Diagnosis not present

## 2021-11-26 DIAGNOSIS — S60457A Superficial foreign body of left little finger, initial encounter: Secondary | ICD-10-CM | POA: Diagnosis not present

## 2021-11-26 DIAGNOSIS — M795 Residual foreign body in soft tissue: Secondary | ICD-10-CM | POA: Diagnosis not present

## 2021-12-03 DIAGNOSIS — S60459A Superficial foreign body of unspecified finger, initial encounter: Secondary | ICD-10-CM | POA: Diagnosis not present

## 2021-12-05 DIAGNOSIS — G2581 Restless legs syndrome: Secondary | ICD-10-CM | POA: Diagnosis not present

## 2021-12-05 DIAGNOSIS — J4530 Mild persistent asthma, uncomplicated: Secondary | ICD-10-CM | POA: Diagnosis not present

## 2021-12-05 DIAGNOSIS — G4733 Obstructive sleep apnea (adult) (pediatric): Secondary | ICD-10-CM | POA: Diagnosis not present

## 2021-12-05 DIAGNOSIS — R5383 Other fatigue: Secondary | ICD-10-CM | POA: Diagnosis not present

## 2021-12-13 DIAGNOSIS — G4733 Obstructive sleep apnea (adult) (pediatric): Secondary | ICD-10-CM | POA: Diagnosis not present

## 2021-12-17 DIAGNOSIS — Z79899 Other long term (current) drug therapy: Secondary | ICD-10-CM | POA: Diagnosis not present

## 2021-12-20 DIAGNOSIS — H6593 Unspecified nonsuppurative otitis media, bilateral: Secondary | ICD-10-CM | POA: Diagnosis not present

## 2021-12-20 DIAGNOSIS — H6993 Unspecified Eustachian tube disorder, bilateral: Secondary | ICD-10-CM | POA: Diagnosis not present

## 2021-12-24 DIAGNOSIS — G4733 Obstructive sleep apnea (adult) (pediatric): Secondary | ICD-10-CM | POA: Diagnosis not present

## 2021-12-27 ENCOUNTER — Ambulatory Visit: Payer: Medicare Other | Attending: Cardiology | Admitting: Cardiology

## 2021-12-27 DIAGNOSIS — K921 Melena: Secondary | ICD-10-CM | POA: Diagnosis not present

## 2021-12-27 NOTE — Progress Notes (Deleted)
Cardiology Office Note:    Date:  12/27/2021   ID:  Maria Ellis, DOB Jan 14, 1986, MRN [id number]  PCP:  Crist Fat, MD  Cardiologist:  Norman Herrlich, MD    Referring MD: Leonia Reader, Barbara Cower, MD    ASSESSMENT:    1. Ischemic cardiomyopathy   2. Chronic combined systolic and diastolic heart failure (HCC)   3. Coronary artery disease of native artery of native heart with stable angina pectoris (HCC)   4. Familial hypercholesterolemia    PLAN:    In order of problems listed above:  ***   Next appointment: ***   Medication Adjustments/Labs and Tests Ordered: Current medicines are reviewed at length with the patient today.  Concerns regarding medicines are outlined above.  No orders of the defined types were placed in this encounter.  No orders of the defined types were placed in this encounter.   No chief complaint on file.   History of Present Illness:    Maria Ellis is a 36 y.o. female with a hx of CAD with an ischemic cardiomyopathy and systolic heart failure familial hyperlipidemia hydrocephalus with Chiari malformation last seen 09/27/2021.  Initially presented with out-of-hospital VF arrest successful resuscitation subsequent PCI and stent [facility] 03/24/2021.  Most recent ejection fraction 45% on guideline directed therapy. Compliance with diet, lifestyle and medications: *** Past Medical History:  Diagnosis Date   Bell's palsy    Headache    High cholesterol    History of Chiari malformation    Hydrocephalus (HCC)    Myocardial infarct [facility])     Past Surgical History:  Procedure Laterality Date   BRAIN SURGERY     CORONARY ANGIOPLASTY WITH STENT PLACEMENT     CORONARY/GRAFT ACUTE MI REVASCULARIZATION N/A 03/20/2021   Procedure: Coronary/Graft Acute MI Revascularization;  Surgeon: Marykay Lex, MD;  Location: [facility] INVASIVE CV LAB;  Service: Cardiovascular;  Laterality: N/A;   LAPAROSCOPIC APPENDECTOMY Left 05/18/2013   Procedure: APPENDECTOMY  LAPAROSCOPIC;  Surgeon: Velora Heckler, MD;  Location: [facility] ORS;  Service: General;  Laterality: Left;   LEFT HEART CATH AND CORONARY ANGIOGRAPHY N/A 03/20/2021   Procedure: LEFT HEART CATH AND CORONARY ANGIOGRAPHY;  Surgeon: Marykay Lex, MD;  Location: [facility] INVASIVE CV LAB;  Service: Cardiovascular;  Laterality: N/A;   MULTIPLE TOOTH EXTRACTIONS     SHUNT REPLACEMENT     x 2   TONSILECTOMY, ADENOIDECTOMY, BILATERAL MYRINGOTOMY AND TUBES      Current Medications: No outpatient medications have been marked as taking for the 12/27/21 encounter (Appointment) with Baldo Daub, MD.     Allergies:   Penicillins, Iodinated contrast media, Multihance [gadobenate], and Tylenol [acetaminophen]   Social History   Socioeconomic History   Marital status: Single    Spouse name: Not on file   Number of children: 0   Years of education: 12   Highest education level: High school graduate  Occupational History   Occupation: Disabled  Tobacco Use   Smoking status: Former    Years: 21.00    Types: Cigarettes    Quit date: 03/20/2021    Years since quitting: 0.7    Passive exposure: Past   Smokeless tobacco: Never   Tobacco comments:    Quit after her MI in July 2023  Vaping Use   Vaping Use: Never used  Substance and Sexual Activity   Alcohol use: Yes    Comment: occ   Drug use: Yes    Types: Marijuana  Comment: July 2023   Sexual activity: Yes    Birth control/protection: None  Other Topics Concern   Not on file  Social History Narrative   Lives at home with her mother.   Left-handed.   8-9 cups caffeine per day.   Social Determinants of Health   Financial Resource Strain: Low Risk  (08/27/2021)   Overall Financial Resource Strain (CARDIA)    Difficulty of Paying Living Expenses: Not very hard  Food Insecurity: No Food Insecurity (08/27/2021)   Hunger Vital Sign    Worried About Running Out of Food in the Last Year: Never true    Ran Out of Food in the Last Year: Never true   Transportation Needs: No Transportation Needs (08/27/2021)   PRAPARE - Administrator, Civil Service (Medical): No    Lack of Transportation (Non-Medical): No  Physical Activity: Not on file  Stress: Not on file  Social Connections: Not on file     Family History: The patient's ***family history includes Cirrhosis in her father; Diabetes in her father and mother; Heart failure in her father. ROS:   Please see the history of present illness.    All other systems reviewed and are negative.  EKGs/Labs/Other Studies Reviewed:    The following studies were reviewed today:  EKG:  EKG ordered today and personally reviewed.  The ekg ordered today demonstrates ***  Recent Labs: 03/21/2021: Magnesium 2.2 05/15/2021: NT-Pro BNP 352 07/29/2021: B Natriuretic Peptide 37.6 08/27/2021: ALT 20; Hemoglobin 13.7; Platelets 211 09/09/2021: BUN 5; Creatinine, Ser 0.69; Potassium 4.0; Sodium 138  Recent Lipid Panel    Component Value Date/Time   CHOL 104 05/15/2021 1106   TRIG 118 05/15/2021 1106   HDL 47 05/15/2021 1106   CHOLHDL 2.2 05/15/2021 1106   CHOLHDL 4.4 03/20/2021 0526   VLDL 7 03/20/2021 0526   LDLCALC 36 05/15/2021 1106    Physical Exam:    VS:  There were no vitals taken for this visit.    Wt Readings from Last 3 Encounters:  09/27/21 172 lb 9.6 oz (78.3 kg)  09/09/21 175 lb (79.4 kg)  08/26/21 177 lb 12.8 oz (80.6 kg)     GEN: *** Well nourished, well developed in no acute distress HEENT: Normal NECK: No JVD; No carotid bruits LYMPHATICS: No lymphadenopathy CARDIAC: ***RRR, no murmurs, rubs, gallops RESPIRATORY:  Clear to auscultation without rales, wheezing or rhonchi  ABDOMEN: Soft, non-tender, non-distended MUSCULOSKELETAL:  No edema; No deformity  SKIN: Warm and dry NEUROLOGIC:  Alert and oriented x 3 PSYCHIATRIC:  Normal affect    Signed, Norman Herrlich, MD  12/27/2021 7:29 AM    [facility]

## 2022-01-06 DIAGNOSIS — H6993 Unspecified Eustachian tube disorder, bilateral: Secondary | ICD-10-CM | POA: Diagnosis not present

## 2022-01-06 DIAGNOSIS — H6593 Unspecified nonsuppurative otitis media, bilateral: Secondary | ICD-10-CM | POA: Diagnosis not present

## 2022-01-07 DIAGNOSIS — G4733 Obstructive sleep apnea (adult) (pediatric): Secondary | ICD-10-CM | POA: Diagnosis not present

## 2022-01-08 DIAGNOSIS — K635 Polyp of colon: Secondary | ICD-10-CM | POA: Diagnosis not present

## 2022-01-08 DIAGNOSIS — R195 Other fecal abnormalities: Secondary | ICD-10-CM | POA: Diagnosis not present

## 2022-01-08 DIAGNOSIS — Z791 Long term (current) use of non-steroidal anti-inflammatories (NSAID): Secondary | ICD-10-CM | POA: Diagnosis not present

## 2022-01-10 ENCOUNTER — Telehealth: Payer: Self-pay

## 2022-01-10 NOTE — Telephone Encounter (Signed)
Primary care office note 10/30/2021 and labs received. Forward to Dr Dulce Sellar for review

## 2022-01-14 DIAGNOSIS — Z79899 Other long term (current) drug therapy: Secondary | ICD-10-CM | POA: Diagnosis not present

## 2022-01-24 DIAGNOSIS — G4733 Obstructive sleep apnea (adult) (pediatric): Secondary | ICD-10-CM | POA: Diagnosis not present

## 2022-01-27 ENCOUNTER — Ambulatory Visit: Payer: Medicare Other | Admitting: Internal Medicine

## 2022-01-29 DIAGNOSIS — H66001 Acute suppurative otitis media without spontaneous rupture of ear drum, right ear: Secondary | ICD-10-CM | POA: Diagnosis not present

## 2022-02-01 DIAGNOSIS — H00011 Hordeolum externum right upper eyelid: Secondary | ICD-10-CM | POA: Diagnosis not present

## 2022-02-06 DIAGNOSIS — R5383 Other fatigue: Secondary | ICD-10-CM | POA: Diagnosis not present

## 2022-02-06 DIAGNOSIS — J4530 Mild persistent asthma, uncomplicated: Secondary | ICD-10-CM | POA: Diagnosis not present

## 2022-02-06 DIAGNOSIS — G4733 Obstructive sleep apnea (adult) (pediatric): Secondary | ICD-10-CM | POA: Diagnosis not present

## 2022-02-06 DIAGNOSIS — G2581 Restless legs syndrome: Secondary | ICD-10-CM | POA: Diagnosis not present

## 2022-02-18 ENCOUNTER — Other Ambulatory Visit: Payer: Self-pay | Admitting: Physician Assistant

## 2022-02-18 DIAGNOSIS — Z79899 Other long term (current) drug therapy: Secondary | ICD-10-CM | POA: Diagnosis not present

## 2022-02-18 NOTE — Progress Notes (Unsigned)
Cardiology Office Note:    Date:  02/19/2022   ID:  Maria Ellis, DOB 27-May-1985, MRN [id number]  PCP:  Townsend Roger, MD  Cardiologist:  Shirlee More, MD    Referring MD: Nona Dell, Corene Cornea, MD    ASSESSMENT:    1. CAD S/P percutaneous coronary angioplasty   2. Ischemic cardiomyopathy   3. Chronic combined systolic and diastolic heart failure ([place])   4. Familial hypercholesterolemia    PLAN:    In order of problems listed above:  She is doing quite well clinically with her heart disease including CAD previous severe cardiomyopathy with improvement in ejection fraction having no angina and no signs or symptoms of heart failure. Continue antianginal therapy aspirin and lipid-lowering beta-blocker Recheck echocardiogram regarding ejection fraction now that she is off Entresto continue SGLT2 inhibitor and MRA Recheck lipid profile continue combined lipid-lowering therapy Discontinue Brilinta as of 04/21/2022   Next appointment: 6 minutes   Medication Adjustments/Labs and Tests Ordered: Current medicines are reviewed at length with the patient today.  Concerns regarding medicines are outlined above.  No orders of the defined types were placed in this encounter.  No orders of the defined types were placed in this encounter.   Chief Complaint  Patient presents with   Follow-up   Coronary Artery Disease   Cardiomyopathy    History of Present Illness:    Maria Ellis is a 37 y.o. female with a hx of CAD ischemic cardiomyopathy systolic heart failure familial hyperlipidemia hydrocephalus with Chiari malformation and successful resuscitation from initial presentation out-of-hospital VF arrest with PCI and stent of LAD 03/24/2021 with improvement in ejection fraction to mildly reduced 45% on guideline directed therapy.  She was last seen 09/27/2021.  Compliance with diet, lifestyle and medications: She is very committed to taking care of herself compliant with her  medications Blood pressure runs in the range of 108-112/56-58 at home She has had no hypotensive episodes Continues to be active 8000 steps a day or greater No edema shortness of breath chest pain palpitation or syncope She is on lipid-lowering therapy with combined statin and Zetia without muscle pain or weakness Past Medical History:  Diagnosis Date   Bell's palsy    Headache    High cholesterol    History of Chiari malformation    Hydrocephalus (HCC)    Myocardial infarct [facility])     Past Surgical History:  Procedure Laterality Date   BRAIN SURGERY     CORONARY ANGIOPLASTY WITH STENT PLACEMENT     CORONARY/GRAFT ACUTE MI REVASCULARIZATION N/A 03/20/2021   Procedure: Coronary/Graft Acute MI Revascularization;  Surgeon: Leonie Man, MD;  Location: [place] CV LAB;  Service: Cardiovascular;  Laterality: N/A;   LAPAROSCOPIC APPENDECTOMY Left 05/18/2013   Procedure: APPENDECTOMY LAPAROSCOPIC;  Surgeon: Earnstine Regal, MD;  Location: [facility];  Service: General;  Laterality: Left;   LEFT HEART CATH AND CORONARY ANGIOGRAPHY N/A 03/20/2021   Procedure: LEFT HEART CATH AND CORONARY ANGIOGRAPHY;  Surgeon: Leonie Man, MD;  Location: [place] CV LAB;  Service: Cardiovascular;  Laterality: N/A;   MULTIPLE TOOTH EXTRACTIONS     SHUNT REPLACEMENT     x 2   TONSILECTOMY, ADENOIDECTOMY, BILATERAL MYRINGOTOMY AND TUBES      Current Medications: Current Meds  Medication Sig   AIMOVIG 70 MG/ML SOAJ Apply 70 mg topically every 30 (thirty) days.   aspirin 81 MG chewable tablet Chew 1 tablet (81 mg total) by mouth daily.   Cholecalciferol (  VITAMIN D3) 1.25 MG (50000 UT) CAPS Take 1 capsule by mouth every 30 (thirty) days.   citalopram (CELEXA) 40 MG tablet Take 40 mg by mouth daily.   ezetimibe (ZETIA) 10 MG tablet Take 1 tablet (10 mg total) by mouth daily.   FARXIGA 10 MG TABS tablet TAKE 1 TABLET BY MOUTH EVERY DAY   fluticasone (FLONASE) 50 MCG/ACT nasal spray Place 1 spray into  both nostrils in the morning and at bedtime.   hydrocortisone cream (HYDROCORTISONE ANTI-ITCH) 1 % Apply 1 Application topically 2 (two) times daily as needed for itching.   nitroGLYCERIN (NITROSTAT) 0.4 MG SL tablet Place 1 tablet (0.4 mg total) under the tongue every 5 (five) minutes as needed for chest pain.   olopatadine (PATANOL) 0.1 % ophthalmic solution Place 1 drop into both eyes 2 (two) times daily as needed for allergies.   omeprazole (PRILOSEC) 20 MG capsule Take 1 capsule (20 mg total) by mouth daily.   ondansetron (ZOFRAN-ODT) 4 MG disintegrating tablet Take 4 mg by mouth 2 (two) times daily as needed for nausea/vomiting.   PRESCRIPTION MEDICATION Take 1 tablet by mouth as needed (migraine). Nurtec - Samples   rosuvastatin (CRESTOR) 40 MG tablet Take 1 tablet (40 mg total) by mouth daily.   spironolactone (ALDACTONE) 25 MG tablet Take 12.5 mg by mouth daily.   SUBOXONE 8-2 MG FILM Place 1 strip under the tongue daily as needed (withdrawal).   ticagrelor (BRILINTA) 90 MG TABS tablet Take 1 tablet (90 mg total) by mouth 2 (two) times daily.   topiramate (TOPAMAX) 50 MG tablet Take 50 mg by mouth daily.   TRELEGY ELLIPTA 100-62.5-25 MCG/ACT AEPB Inhale 1 puff into the lungs daily.     Allergies:   Penicillins, Iodinated contrast media, Multihance [gadobenate], and Tylenol [acetaminophen]   Social History   Socioeconomic History   Marital status: Single    Spouse name: Not on file   Number of children: 0   Years of education: 12   Highest education level: High school graduate  Occupational History   Occupation: Disabled  Tobacco Use   Smoking status: Former    Years: 21.00    Types: Cigarettes    Quit date: 03/20/2021    Years since quitting: 0.9    Passive exposure: Past   Smokeless tobacco: Never   Tobacco comments:    Quit after her MI in July 2023  Vaping Use   Vaping Use: Never used  Substance and Sexual Activity   Alcohol use: Yes    Comment: occ   Drug use: Yes     Types: Marijuana    Comment: July 2023   Sexual activity: Yes    Birth control/protection: None  Other Topics Concern   Not on file  Social History Narrative   Lives at home with her mother.   Left-handed.   8-9 cups caffeine per day.   Social Determinants of Health   Financial Resource Strain: Low Risk  (08/27/2021)   Overall Financial Resource Strain (CARDIA)    Difficulty of Paying Living Expenses: Not very hard  Food Insecurity: No Food Insecurity (08/27/2021)   Hunger Vital Sign    Worried About Running Out of Food in the Last Year: Never true    Ran Out of Food in the Last Year: Never true  Transportation Needs: No Transportation Needs (08/27/2021)   PRAPARE - Hydrologist (Medical): No    Lack of Transportation (Non-Medical): No  Physical Activity: Not  on file  Stress: Not on file  Social Connections: Not on file     Family History: The patient's family history includes Cirrhosis in her father; Diabetes in her father and mother; Heart failure in her father. ROS:   Please see the history of present illness.    All other systems reviewed and are negative.  EKGs/Labs/Other Studies Reviewed:    The following studies were reviewed today: I Recent Labs: 03/21/2021: Magnesium 2.2 05/15/2021: NT-Pro BNP 352 07/29/2021: B Natriuretic Peptide 37.6 08/27/2021: ALT 20; Hemoglobin 13.7; Platelets 211 09/09/2021: BUN 5; Creatinine, Ser 0.69; Potassium 4.0; Sodium 138  Recent Lipid Panel    Component Value Date/Time   CHOL 104 05/15/2021 1106   TRIG 118 05/15/2021 1106   HDL 47 05/15/2021 1106   CHOLHDL 2.2 05/15/2021 1106   CHOLHDL 4.4 03/20/2021 0526   VLDL 7 03/20/2021 0526   LDLCALC 36 05/15/2021 1106    Physical Exam:    VS:  BP 102/70 (BP Location: Right Arm, Patient Position: Sitting)   Pulse 72   Ht 5\' 3"  (1.6 m)   Wt 172 lb (78 kg)   SpO2 98%   BMI 30.47 kg/m     Wt Readings from Last 3 Encounters:  02/19/22 172 lb (78 kg)   09/27/21 172 lb 9.6 oz (78.3 kg)  09/09/21 175 lb (79.4 kg)     GEN:  Well nourished, well developed in no acute distress HEENT: Normal NECK: No JVD; No carotid bruits LYMPHATICS: No lymphadenopathy CARDIAC: RRR, no murmurs, rubs, gallops RESPIRATORY:  Clear to auscultation without rales, wheezing or rhonchi  ABDOMEN: Soft, non-tender, non-distended MUSCULOSKELETAL:  No edema; No deformity  SKIN: Warm and dry NEUROLOGIC:  Alert and oriented x 3 PSYCHIATRIC:  Normal affect    Signed, Shirlee More, MD  02/19/2022 1:39 PM    [facility]

## 2022-02-19 ENCOUNTER — Encounter: Payer: Self-pay | Admitting: Cardiology

## 2022-02-19 ENCOUNTER — Ambulatory Visit: Payer: 59 | Attending: Cardiology | Admitting: Cardiology

## 2022-02-19 VITALS — BP 102/70 | HR 72 | Ht 63.0 in | Wt 172.0 lb

## 2022-02-19 DIAGNOSIS — I2510 Atherosclerotic heart disease of native coronary artery without angina pectoris: Secondary | ICD-10-CM | POA: Diagnosis not present

## 2022-02-19 DIAGNOSIS — E7801 Familial hypercholesterolemia: Secondary | ICD-10-CM

## 2022-02-19 DIAGNOSIS — I255 Ischemic cardiomyopathy: Secondary | ICD-10-CM

## 2022-02-19 DIAGNOSIS — Z9861 Coronary angioplasty status: Secondary | ICD-10-CM | POA: Diagnosis not present

## 2022-02-19 DIAGNOSIS — I5042 Chronic combined systolic (congestive) and diastolic (congestive) heart failure: Secondary | ICD-10-CM | POA: Diagnosis not present

## 2022-02-19 NOTE — Patient Instructions (Signed)
Medication Instructions:  Your physician recommends that you continue on your current medications as directed. Please refer to the Current Medication list given to you today.  *If you need a refill on your cardiac medications before your next appointment, please call your pharmacy*   Lab Work: Your physician recommends that you return for lab work in:   Labs today: CMP, Lipid, Pro BNP  If you have labs (blood work) drawn today and your tests are completely normal, you will receive your results only by: MyChart Message (if you have MyChart) OR A paper copy in the mail If you have any lab test that is abnormal or we need to change your treatment, we will call you to review the results.   Testing/Procedures: Your physician has requested that you have an echocardiogram. Echocardiography is a painless test that uses sound waves to create images of your heart. It provides your doctor with information about the size and shape of your heart and how well your heart's chambers and valves are working. This procedure takes approximately one hour. There are no restrictions for this procedure. Please do NOT wear cologne, perfume, aftershave, or lotions (deodorant is allowed). Please arrive 15 minutes prior to your appointment time.    Follow-Up: At [facility], you and your health needs are our priority.  As part of our continuing mission to provide you with exceptional heart care, we have created designated Provider Care Teams.  These Care Teams include your primary Cardiologist (physician) and Advanced Practice Providers (APPs -  Physician Assistants and Nurse Practitioners) who all work together to provide you with the care you need, when you need it.  We recommend signing up for the patient portal called "MyChart".  Sign up information is provided on this After Visit Summary.  MyChart is used to connect with patients for Virtual Visits (Telemedicine).  Patients are able to view lab/test  results, encounter notes, upcoming appointments, etc.  Non-urgent messages can be sent to your provider as well.   To learn more about what you can do with MyChart, go to [email or web address].    Your next appointment:   6 month(s)  Provider:   Shirlee More, MD    Other Instructions As of 04/21/22 stop Brilinta

## 2022-02-20 LAB — COMPREHENSIVE METABOLIC PANEL
ALT: 31 IU/L (ref 0–32)
AST: 21 IU/L (ref 0–40)
Albumin/Globulin Ratio: 2.1 (ref 1.2–2.2)
Albumin: 4.5 g/dL (ref 3.9–4.9)
Alkaline Phosphatase: 98 IU/L (ref 44–121)
BUN/Creatinine Ratio: 9 (ref 9–23)
BUN: 7 mg/dL (ref 6–20)
Bilirubin Total: 0.3 mg/dL (ref 0.0–1.2)
CO2: 21 mmol/L (ref 20–29)
Calcium: 9 mg/dL (ref 8.7–10.2)
Chloride: 107 mmol/L — ABNORMAL HIGH (ref 96–106)
Creatinine, Ser: 0.77 mg/dL (ref 0.57–1.00)
Globulin, Total: 2.1 g/dL (ref 1.5–4.5)
Glucose: 98 mg/dL (ref 70–99)
Potassium: 3.9 mmol/L (ref 3.5–5.2)
Sodium: 141 mmol/L (ref 134–144)
Total Protein: 6.6 g/dL (ref 6.0–8.5)
eGFR: 102 mL/min/{1.73_m2} (ref >59)

## 2022-02-20 LAB — LIPID PANEL
Chol/HDL Ratio: 1.9 ratio (ref 0.0–4.4)
Cholesterol, Total: 102 mg/dL (ref 100–199)
HDL: 54 mg/dL (ref >39)
LDL Chol Calc (NIH): 30 mg/dL (ref 0–99)
Triglycerides: 91 mg/dL (ref 0–149)
VLDL Cholesterol Cal: 18 mg/dL (ref 5–40)

## 2022-02-20 LAB — PRO B NATRIURETIC PEPTIDE: NT-Pro BNP: 121 pg/mL (ref 0–130)

## 2022-02-21 ENCOUNTER — Ambulatory Visit: Payer: 59 | Attending: Cardiology

## 2022-02-21 DIAGNOSIS — I255 Ischemic cardiomyopathy: Secondary | ICD-10-CM

## 2022-02-21 DIAGNOSIS — E7801 Familial hypercholesterolemia: Secondary | ICD-10-CM | POA: Diagnosis not present

## 2022-02-21 DIAGNOSIS — I2510 Atherosclerotic heart disease of native coronary artery without angina pectoris: Secondary | ICD-10-CM

## 2022-02-21 DIAGNOSIS — I5042 Chronic combined systolic (congestive) and diastolic (congestive) heart failure: Secondary | ICD-10-CM

## 2022-02-21 DIAGNOSIS — Z9861 Coronary angioplasty status: Secondary | ICD-10-CM | POA: Diagnosis not present

## 2022-02-22 LAB — ECHOCARDIOGRAM COMPLETE
Area-P 1/2: 4.12 cm2
S' Lateral: 4.4 cm

## 2022-02-24 DIAGNOSIS — G4733 Obstructive sleep apnea (adult) (pediatric): Secondary | ICD-10-CM | POA: Diagnosis not present

## 2022-02-27 DIAGNOSIS — H6993 Unspecified Eustachian tube disorder, bilateral: Secondary | ICD-10-CM | POA: Diagnosis not present

## 2022-02-27 DIAGNOSIS — H6593 Unspecified nonsuppurative otitis media, bilateral: Secondary | ICD-10-CM | POA: Diagnosis not present

## 2022-02-27 DIAGNOSIS — H9193 Unspecified hearing loss, bilateral: Secondary | ICD-10-CM | POA: Diagnosis not present

## 2022-02-28 ENCOUNTER — Other Ambulatory Visit: Payer: Self-pay

## 2022-02-28 MED ORDER — TICAGRELOR 90 MG PO TABS: 90.0000 mg | ORAL_TABLET | Freq: Two times a day (BID) | ORAL | 3 refills | Status: DC

## 2022-02-28 MED ORDER — DAPAGLIFLOZIN PROPANEDIOL 10 MG PO TABS: 10.0000 mg | ORAL_TABLET | Freq: Every day | ORAL | 3 refills | Status: DC

## 2022-02-28 NOTE — Telephone Encounter (Signed)
Rx sent to patients mail pharmacy [facility] for [place] and Brilinta

## 2022-03-03 DIAGNOSIS — J45909 Unspecified asthma, uncomplicated: Secondary | ICD-10-CM | POA: Diagnosis not present

## 2022-03-03 DIAGNOSIS — G4733 Obstructive sleep apnea (adult) (pediatric): Secondary | ICD-10-CM | POA: Diagnosis not present

## 2022-03-04 ENCOUNTER — Other Ambulatory Visit: Payer: Self-pay

## 2022-03-04 MED ORDER — TRELEGY ELLIPTA 100-62.5-25 MCG/ACT IN AEPB: 1.0000 | INHALATION_SPRAY | Freq: Every day | RESPIRATORY_TRACT | 3 refills | Status: DC

## 2022-03-04 MED ORDER — OMEPRAZOLE 20 MG PO CPDR: 20.0000 mg | DELAYED_RELEASE_CAPSULE | Freq: Every day | ORAL | 0 refills | Status: DC

## 2022-03-05 ENCOUNTER — Other Ambulatory Visit: Payer: Self-pay

## 2022-03-05 MED ORDER — OMEPRAZOLE 20 MG PO CPDR: 20.0000 mg | DELAYED_RELEASE_CAPSULE | Freq: Every day | ORAL | 0 refills | Status: DC

## 2022-03-05 MED ORDER — TOPIRAMATE 50 MG PO TABS: 50.0000 mg | ORAL_TABLET | Freq: Every day | ORAL | 0 refills | Status: DC

## 2022-03-05 MED ORDER — TRELEGY ELLIPTA 100-62.5-25 MCG/ACT IN AEPB: 1.0000 | INHALATION_SPRAY | Freq: Every day | RESPIRATORY_TRACT | 3 refills | Status: DC

## 2022-03-05 MED ORDER — SPIRONOLACTONE 25 MG PO TABS: 12.5000 mg | ORAL_TABLET | Freq: Every day | ORAL | 1 refills | Status: DC

## 2022-03-05 MED ORDER — CITALOPRAM HYDROBROMIDE 40 MG PO TABS: 40.0000 mg | ORAL_TABLET | Freq: Every day | ORAL | 0 refills | Status: DC

## 2022-03-05 MED ORDER — FLUTICASONE PROPIONATE 50 MCG/ACT NA SUSP: 1.0000 | Freq: Two times a day (BID) | NASAL | 3 refills | Status: DC

## 2022-03-10 ENCOUNTER — Encounter: Payer: Self-pay | Admitting: Cardiology

## 2022-03-10 ENCOUNTER — Other Ambulatory Visit: Payer: Self-pay | Admitting: Physician Assistant

## 2022-03-12 DIAGNOSIS — H9190 Unspecified hearing loss, unspecified ear: Secondary | ICD-10-CM | POA: Diagnosis not present

## 2022-03-12 DIAGNOSIS — H6993 Unspecified Eustachian tube disorder, bilateral: Secondary | ICD-10-CM | POA: Diagnosis not present

## 2022-03-20 DIAGNOSIS — J4530 Mild persistent asthma, uncomplicated: Secondary | ICD-10-CM | POA: Diagnosis not present

## 2022-03-20 DIAGNOSIS — G4733 Obstructive sleep apnea (adult) (pediatric): Secondary | ICD-10-CM | POA: Diagnosis not present

## 2022-03-20 DIAGNOSIS — G2581 Restless legs syndrome: Secondary | ICD-10-CM | POA: Diagnosis not present

## 2022-03-20 DIAGNOSIS — R5383 Other fatigue: Secondary | ICD-10-CM | POA: Diagnosis not present

## 2022-03-25 ENCOUNTER — Other Ambulatory Visit: Payer: Self-pay | Admitting: Physician Assistant

## 2022-03-25 DIAGNOSIS — Z79899 Other long term (current) drug therapy: Secondary | ICD-10-CM | POA: Diagnosis not present

## 2022-03-25 DIAGNOSIS — G4733 Obstructive sleep apnea (adult) (pediatric): Secondary | ICD-10-CM | POA: Diagnosis not present

## 2022-04-22 DIAGNOSIS — H6691 Otitis media, unspecified, right ear: Secondary | ICD-10-CM | POA: Diagnosis not present

## 2022-04-25 DIAGNOSIS — G4733 Obstructive sleep apnea (adult) (pediatric): Secondary | ICD-10-CM | POA: Diagnosis not present

## 2022-04-29 DIAGNOSIS — Z79899 Other long term (current) drug therapy: Secondary | ICD-10-CM | POA: Diagnosis not present

## 2022-05-01 ENCOUNTER — Encounter: Payer: Self-pay | Admitting: Cardiology

## 2022-05-01 DIAGNOSIS — G2581 Restless legs syndrome: Secondary | ICD-10-CM | POA: Diagnosis not present

## 2022-05-01 DIAGNOSIS — J4530 Mild persistent asthma, uncomplicated: Secondary | ICD-10-CM | POA: Diagnosis not present

## 2022-05-01 DIAGNOSIS — R5383 Other fatigue: Secondary | ICD-10-CM | POA: Diagnosis not present

## 2022-05-01 DIAGNOSIS — G4733 Obstructive sleep apnea (adult) (pediatric): Secondary | ICD-10-CM | POA: Diagnosis not present

## 2022-05-06 ENCOUNTER — Other Ambulatory Visit: Payer: Self-pay | Admitting: Internal Medicine

## 2022-05-06 NOTE — Telephone Encounter (Signed)
Pt needs an appt

## 2022-05-13 ENCOUNTER — Other Ambulatory Visit: Payer: Self-pay | Admitting: Cardiology

## 2022-05-13 ENCOUNTER — Encounter: Payer: Self-pay | Admitting: Cardiology

## 2022-05-13 ENCOUNTER — Telehealth: Payer: Self-pay | Admitting: Cardiology

## 2022-05-13 DIAGNOSIS — H6993 Unspecified Eustachian tube disorder, bilateral: Secondary | ICD-10-CM | POA: Diagnosis not present

## 2022-05-13 DIAGNOSIS — H9193 Unspecified hearing loss, bilateral: Secondary | ICD-10-CM | POA: Diagnosis not present

## 2022-05-13 DIAGNOSIS — H6591 Unspecified nonsuppurative otitis media, right ear: Secondary | ICD-10-CM | POA: Diagnosis not present

## 2022-05-13 NOTE — Telephone Encounter (Signed)
Patient is calling about the message she sent through MyChart she states she hasn't heard back yet.  She states she sent the message over a week ago.  She has her appt today with the ear doctor. She wants to know if it's safe for her to go under general anesthesia.

## 2022-05-13 NOTE — Telephone Encounter (Signed)
error 

## 2022-05-19 NOTE — Telephone Encounter (Signed)
Called patient and she is going to be having tubes placed in her ears to help with fluid build-up and pressure. She was asking if it was ok for her to undergo anesthesia for this procedure. She states that she was told that she would have to wait 1 year to have this procedure. March 1st was the year mark. Please advise

## 2022-05-20 NOTE — Telephone Encounter (Signed)
Called patient and informed her of Dr. Hulen Shouts recommendation below regarding undergoing anesthesia for her procedure:  "Same answer such a minor procedure I do not have a concern"  Patient verbalized understanding and had no further questions at this time.

## 2022-05-25 DIAGNOSIS — G4733 Obstructive sleep apnea (adult) (pediatric): Secondary | ICD-10-CM | POA: Diagnosis not present

## 2022-05-26 DIAGNOSIS — G4733 Obstructive sleep apnea (adult) (pediatric): Secondary | ICD-10-CM | POA: Diagnosis not present

## 2022-05-26 DIAGNOSIS — J45909 Unspecified asthma, uncomplicated: Secondary | ICD-10-CM | POA: Diagnosis not present

## 2022-05-28 DIAGNOSIS — H6993 Unspecified Eustachian tube disorder, bilateral: Secondary | ICD-10-CM | POA: Diagnosis not present

## 2022-05-30 ENCOUNTER — Other Ambulatory Visit: Payer: Self-pay | Admitting: Internal Medicine

## 2022-05-30 NOTE — Telephone Encounter (Signed)
Pt needs an appt

## 2022-06-03 DIAGNOSIS — Z79899 Other long term (current) drug therapy: Secondary | ICD-10-CM | POA: Diagnosis not present

## 2022-06-19 DIAGNOSIS — H9202 Otalgia, left ear: Secondary | ICD-10-CM | POA: Diagnosis not present

## 2022-06-19 DIAGNOSIS — Z8669 Personal history of other diseases of the nervous system and sense organs: Secondary | ICD-10-CM | POA: Diagnosis not present

## 2022-06-19 DIAGNOSIS — Z9622 Myringotomy tube(s) status: Secondary | ICD-10-CM | POA: Diagnosis not present

## 2022-06-24 ENCOUNTER — Other Ambulatory Visit (HOSPITAL_COMMUNITY): Payer: Self-pay | Admitting: Cardiology

## 2022-06-25 DIAGNOSIS — G4733 Obstructive sleep apnea (adult) (pediatric): Secondary | ICD-10-CM | POA: Diagnosis not present

## 2022-07-01 DIAGNOSIS — Z79899 Other long term (current) drug therapy: Secondary | ICD-10-CM | POA: Diagnosis not present

## 2022-07-07 ENCOUNTER — Other Ambulatory Visit: Payer: Self-pay | Admitting: Internal Medicine

## 2022-07-07 NOTE — Telephone Encounter (Signed)
Pt needs an appt

## 2022-07-15 ENCOUNTER — Observation Stay (HOSPITAL_BASED_OUTPATIENT_CLINIC_OR_DEPARTMENT_OTHER)
Admission: EM | Admit: 2022-07-15 | Discharge: 2022-07-17 | Disposition: A | Discharge status: Home / Self Care | Payer: 59 | Attending: Internal Medicine | Admitting: Internal Medicine

## 2022-07-15 ENCOUNTER — Emergency Department (HOSPITAL_BASED_OUTPATIENT_CLINIC_OR_DEPARTMENT_OTHER): Payer: 59

## 2022-07-15 ENCOUNTER — Other Ambulatory Visit: Payer: Self-pay

## 2022-07-15 ENCOUNTER — Encounter (HOSPITAL_BASED_OUTPATIENT_CLINIC_OR_DEPARTMENT_OTHER): Payer: Self-pay | Admitting: Emergency Medicine

## 2022-07-15 DIAGNOSIS — Z87891 Personal history of nicotine dependence: Secondary | ICD-10-CM | POA: Diagnosis not present

## 2022-07-15 DIAGNOSIS — Z7982 Long term (current) use of aspirin: Secondary | ICD-10-CM | POA: Diagnosis not present

## 2022-07-15 DIAGNOSIS — I5023 Acute on chronic systolic (congestive) heart failure: Secondary | ICD-10-CM

## 2022-07-15 DIAGNOSIS — I110 Hypertensive heart disease with heart failure: Secondary | ICD-10-CM | POA: Insufficient documentation

## 2022-07-15 DIAGNOSIS — R079 Chest pain, unspecified: Secondary | ICD-10-CM | POA: Diagnosis not present

## 2022-07-15 DIAGNOSIS — I5033 Acute on chronic diastolic (congestive) heart failure: Secondary | ICD-10-CM | POA: Insufficient documentation

## 2022-07-15 DIAGNOSIS — I25110 Atherosclerotic heart disease of native coronary artery with unstable angina pectoris: Secondary | ICD-10-CM | POA: Insufficient documentation

## 2022-07-15 DIAGNOSIS — Z955 Presence of coronary angioplasty implant and graft: Secondary | ICD-10-CM | POA: Diagnosis not present

## 2022-07-15 DIAGNOSIS — I200 Unstable angina: Secondary | ICD-10-CM | POA: Diagnosis present

## 2022-07-15 DIAGNOSIS — R0789 Other chest pain: Secondary | ICD-10-CM | POA: Diagnosis not present

## 2022-07-15 HISTORY — DX: Chest pain, unspecified: R07.9

## 2022-07-15 LAB — TROPONIN I (HIGH SENSITIVITY)
Troponin I (High Sensitivity): <2 ng/L (ref <18)
Troponin I (High Sensitivity): <2 ng/L (ref <18)

## 2022-07-15 LAB — COMPREHENSIVE METABOLIC PANEL
ALT: 23 U/L (ref 0–44)
AST: 21 U/L (ref 15–41)
Albumin: 3.9 g/dL (ref 3.5–5.0)
Alkaline Phosphatase: 60 U/L (ref 38–126)
Anion gap: 10 (ref 5–15)
BUN: 11 mg/dL (ref 6–20)
CO2: 24 mmol/L (ref 22–32)
Calcium: 8.4 mg/dL — ABNORMAL LOW (ref 8.9–10.3)
Chloride: 101 mmol/L (ref 98–111)
Creatinine, Ser: 0.64 mg/dL (ref 0.44–1.00)
GFR, Estimated: >60 mL/min (ref >60)
Glucose, Bld: 90 mg/dL (ref 70–99)
Potassium: 3.3 mmol/L — ABNORMAL LOW (ref 3.5–5.1)
Sodium: 135 mmol/L (ref 135–145)
Total Bilirubin: 0.5 mg/dL (ref 0.3–1.2)
Total Protein: 6.5 g/dL (ref 6.5–8.1)

## 2022-07-15 LAB — LIPASE, BLOOD: Lipase: 29 U/L (ref 11–51)

## 2022-07-15 LAB — CBC
HCT: 37.8 % (ref 36.0–46.0)
Hemoglobin: 12.6 g/dL (ref 12.0–15.0)
MCH: 28.4 pg (ref 26.0–34.0)
MCHC: 33.3 g/dL (ref 30.0–36.0)
MCV: 85.3 fL (ref 80.0–100.0)
Platelets: 209 10*3/uL (ref 150–400)
RBC: 4.43 MIL/uL (ref 3.87–5.11)
RDW: 12.5 % (ref 11.5–15.5)
WBC: 9.5 10*3/uL (ref 4.0–10.5)
nRBC: 0 % (ref 0.0–0.2)

## 2022-07-15 LAB — PREGNANCY, URINE: Preg Test, Ur: NEGATIVE

## 2022-07-15 NOTE — ED Provider Notes (Signed)
[facility] Provider Note   CSN: [id number] Arrival date & time: 07/15/22  1809     History  Chief Complaint  Patient presents with   Chest Pain    Maria Ellis is a 37 y.o. female.  Pt is a 37 yo female with pmh of hypertension, hyperlipdemia, and LAD occulusion and angioplasty in March 2023 presenting for chest pain.  Pt admits to left sided chest pain,  without radiation, intermittent, for minutes then returning, that started this morning while walking from one room to the next in her home. Pt admits to similar pain with her last MI. Denies fevers, chills, sob, or coughing. Denies hx of dVT, PE, lower leg pain, swelling, or skin color changes. Previous smoker. Denies current smoker. Takes daily asa. No longer taking Brillinta by cardiologist orders.   Sees cardiologist Dr. Norman Herrlich with [facility]. Dr. Herbie Baltimore did her angioplasty.   The history is provided by the patient. No language interpreter was used.  Chest Pain Associated symptoms: no abdominal pain, no back pain, no cough, no fever, no palpitations, no shortness of breath and no vomiting        Home Medications Prior to Admission medications   Medication Sig Start Date End Date Taking? Authorizing Provider  AIMOVIG 70 MG/ML SOAJ Apply 70 mg topically every 30 (thirty) days. 01/03/21  Yes [provider]  albuterol (VENTOLIN HFA) 108 (90 Base) MCG/ACT inhaler Inhale 2 puffs into the lungs every 4 (four) hours as needed for shortness of breath.   Yes [provider]  aspirin 81 MG chewable tablet Chew 1 tablet (81 mg total) by mouth daily. 03/25/21  Yes Azalee Course, PA  Cholecalciferol (VITAMIN D3) 1.25 MG (50000 UT) CAPS Take 1 capsule by mouth every 30 (thirty) days. 07/15/21  Yes [provider]  citalopram (CELEXA) 40 MG tablet Take 1 tablet (40 mg total) by mouth daily. 03/05/22  Yes Crist Fat, MD  dapagliflozin propanediol  (FARXIGA) 10 MG TABS tablet Take 1 tablet (10 mg total) by mouth daily. 02/28/22  Yes Baldo Daub, MD  ezetimibe (ZETIA) 10 MG tablet TAKE 1 TABLET BY MOUTH EVERY DAY 03/10/22  Yes Azalee Course, PA  fluticasone (FLONASE) 50 MCG/ACT nasal spray Place 1 spray into both nostrils in the morning and at bedtime. 03/05/22  Yes Crist Fat, MD  furosemide (LASIX) 40 MG tablet Take 1 tablet (40 mg total) by mouth daily as needed for fluid or edema (weight gain greater than 3 pounds in 24 hours). 07/17/22 07/17/23 Yes Perlie Gold, PA-C  hydrocortisone cream (HYDROCORTISONE ANTI-ITCH) 1 % Apply 1 Application topically 2 (two) times daily as needed for itching.   Yes [provider]  nitroGLYCERIN (NITROSTAT) 0.4 MG SL tablet Place 1 tablet (0.4 mg total) under the tongue every 5 (five) minutes as needed for chest pain. 03/24/21  Yes Azalee Course, PA  olopatadine (PATANOL) 0.1 % ophthalmic solution Place 1 drop into both eyes 2 (two) times daily as needed for allergies.   Yes [provider]  omeprazole (PRILOSEC) 20 MG capsule Take 1 capsule (20 mg total) by mouth daily. 03/05/22  Yes Crist Fat, MD  ondansetron (ZOFRAN-ODT) 4 MG disintegrating tablet Take 4 mg by mouth 2 (two) times daily as needed for nausea/vomiting. 02/24/21  Yes [provider]  PRESCRIPTION MEDICATION Take 1 tablet by mouth as needed (migraine). Nurtec - Samples from doctor   Yes [provider]  rOPINIRole (REQUIP) 1 MG tablet Take 1 mg by mouth at bedtime.   Yes [provider]  spironolactone (ALDACTONE) 25 MG tablet TAKE 0.5 TABLETS BY MOUTH AT BEDTIME. Patient taking differently: Take 12.5 mg by mouth at bedtime. 06/24/22  Yes Baldo Daub, MD  SUBOXONE 8-2 MG FILM Place 1 strip under the tongue daily as needed (withdrawal). 03/06/21  Yes [provider]  topiramate (TOPAMAX) 50 MG tablet Take 1 tablet (50 mg total) by mouth daily. 03/05/22  Yes Crist Fat, MD  TRELEGY ELLIPTA  100-62.5-25 MCG/ACT AEPB Inhale 1 puff into the lungs daily. 03/05/22  Yes Crist Fat, MD  potassium chloride SA (KLOR-CON M) 20 MEQ tablet Take 1 tablet (20 mEq total) by mouth daily as needed (Only take on days when you also take Lasix (Furosemide)). 07/17/22   Perlie Gold, PA-C  rosuvastatin (CRESTOR) 40 MG tablet Take 1 tablet (40 mg total) by mouth daily. 07/23/22   Baldo Daub, MD      Allergies    Penicillins, Iodinated contrast media, Multihance [gadobenate], and Tylenol [acetaminophen]    Review of Systems   Review of Systems  Constitutional:  Negative for chills and fever.  HENT:  Negative for ear pain and sore throat.   Eyes:  Negative for pain and visual disturbance.  Respiratory:  Negative for cough and shortness of breath.   Cardiovascular:  Positive for chest pain. Negative for palpitations.  Gastrointestinal:  Negative for abdominal pain and vomiting.  Genitourinary:  Negative for dysuria and hematuria.  Musculoskeletal:  Negative for arthralgias and back pain.  Skin:  Negative for color change and rash.  Neurological:  Negative for seizures and syncope.  All other systems reviewed and are negative.   Physical Exam Updated Vital Signs BP (!) 94/52 (BP Location: Right Arm)   Pulse 60   Temp 98.2 F (36.8 C) (Oral)   Resp 15   Ht 5\' 3"  (1.6 m)   Wt 81.2 kg   SpO2 97%   BMI 31.73 kg/m  Physical Exam Vitals and nursing note reviewed.  Constitutional:      General: She is not in acute distress.    Appearance: She is well-developed.  HENT:     Head: Normocephalic and atraumatic.  Eyes:     Conjunctiva/sclera: Conjunctivae normal.  Cardiovascular:     Rate and Rhythm: Normal rate and regular rhythm.     Heart sounds: No murmur heard. Pulmonary:     Effort: Pulmonary effort is normal. No respiratory distress.     Breath sounds: Normal breath sounds.  Abdominal:     Palpations: Abdomen is soft.     Tenderness: There is no abdominal tenderness.   Musculoskeletal:        General: No swelling.     Cervical back: Neck supple.  Skin:    General: Skin is warm and dry.     Capillary Refill: Capillary refill takes less than 2 seconds.  Neurological:     Mental Status: She is alert.  Psychiatric:        Mood and Affect: Mood normal.     ED Results / Procedures / Treatments   Labs (all labs ordered are listed, but only abnormal results are displayed) Labs Reviewed  COMPREHENSIVE METABOLIC PANEL - Abnormal; Notable for the following components:      Result Value   Potassium 3.3 (*)    Calcium 8.4 (*)    All other components within normal limits  BASIC METABOLIC PANEL - Abnormal; Notable for the following components:   Potassium 3.1 (*)    Calcium 8.4 (*)    All other components within normal limits  BASIC METABOLIC PANEL - Abnormal; Notable for the following components:   Sodium 133 (*)    Potassium 2.9 (*)    CO2 20 (*)    Glucose, Bld 205 (*)    All other components within normal limits  CBC - Abnormal; Notable for the following components:   WBC 11.9 (*)    All other components within normal limits  CBC  PREGNANCY, URINE  LIPASE, BLOOD  CBC  PROTIME-INR  BRAIN NATRIURETIC PEPTIDE  MAGNESIUM  TROPONIN I (HIGH SENSITIVITY)  TROPONIN I (HIGH SENSITIVITY)    EKG EKG Interpretation Date/Time:  Tuesday July 15 2022 18:26:09 EDT Ventricular Rate:  65 PR Interval:  147 QRS Duration:  108 QT Interval:  418 QTC Calculation: 435 R Axis:   34  Text Interpretation: Sinus rhythm Atrial premature complex Low voltage, precordial leads Consider anterior infarct Confirmed by Edwin Dada ([id number]) on 07/15/2022 6:42:07 PM  Radiology No results found.  Procedures Procedures    Medications Ordered in ED Medications  0.9 %  sodium chloride infusion (0 mLs Intravenous Stopped 07/16/22 1953)  perflutren lipid microspheres (DEFINITY) IV suspension (2 mLs Intravenous Given 07/16/22 1517)  furosemide (LASIX) injection 20 mg (20  mg Intravenous Given 07/16/22 0738)  potassium chloride SA (KLOR-CON M) CR tablet 60 mEq (60 mEq Oral Given 07/16/22 0739)  potassium chloride SA (KLOR-CON M) CR tablet 40 mEq (40 mEq Oral Given 07/16/22 1050)  diphenhydrAMINE (BENADRYL) injection 25 mg (25 mg Intravenous Given 07/16/22 1214)  methylPREDNISolone sodium succinate (SOLU-MEDROL) 125 mg/2 mL injection 125 mg (125 mg Intravenous Given 07/16/22 1215)  magnesium sulfate IVPB 2 g 50 mL (0 g Intravenous Stopped 07/17/22 1507)  potassium chloride SA (KLOR-CON M) CR tablet 40 mEq (40 mEq Oral Given 07/17/22 1459)    ED Course/ Medical Decision Making/ A&P                             Medical Decision Making Amount and/or Complexity of Data Reviewed Labs: ordered. Radiology: ordered.  Risk Decision regarding hospitalization.    37 yo female with pmh of LAD occulusion and angioplasty in March 2023 presenting for chest pain.  Patient is alert oriented x 3, no acute distress, afebrile, stable vital signs.  Physical exam demonstrates no reproducible chest pain.  The patient's chest pain is not suggestive of pulmonary embolus, cardiac ischemia, aortic dissection, pericarditis, myocarditis, pulmonary embolism, pneumothorax, pneumonia, Zoster, or esophageal perforation, or other serious etiology.  Historically not abrupt in onset, tearing or ripping, pulses symmetric. EKG nonspecific for ischemia/infarction. No dysrhythmias, brugada, WPW, prolonged QT noted. CXR reviewed and WNL. Troponin negative x2. CXR reviewed. Labs without demonstration of acute pathology unless otherwise noted above.  Negative D-dimer.  Low suspicion pulmonary embolism.  Care discussed with cardiology team at [facility] who agrees to accept patient for admission with likely cath in the morning given patient's HEART score and previous MI resulting in cardiac arrest. Patient agreeable to plan.         Final Clinical Impression(s) / ED Diagnoses Final diagnoses:  Chest pain,  unspecified type    Rx / DC Orders ED Discharge Orders          Ordered    furosemide (LASIX) 40 MG tablet  Daily PRN  07/17/22 1317    potassium chloride SA (KLOR-CON M) 20 MEQ tablet  Daily PRN        07/17/22 1317    Increase activity slowly        07/17/22 1511    Diet - low sodium heart healthy        07/17/22 1511    Discharge instructions       Comments: Please present to the [facility] office on Wednesday 7/3 from 8-12:30 or from 1:30-4:30 for repeat metabolic panel.   07/17/22 1511              Franne Forts, DO 07/23/22 1517

## 2022-07-15 NOTE — ED Notes (Signed)
ED Provider at bedside. 

## 2022-07-15 NOTE — ED Triage Notes (Signed)
Patient presents to ED via POV from home. Here with chest pain that began around noon. Denies nausea, vomiting, shortness of breath. Reports mild dizziness.

## 2022-07-16 ENCOUNTER — Observation Stay (HOSPITAL_BASED_OUTPATIENT_CLINIC_OR_DEPARTMENT_OTHER): Payer: 59

## 2022-07-16 ENCOUNTER — Encounter (HOSPITAL_COMMUNITY)
Admission: EM | Disposition: A | Discharge status: Home / Self Care | Payer: Self-pay | Source: Home / Self Care | Attending: Emergency Medicine

## 2022-07-16 DIAGNOSIS — I25110 Atherosclerotic heart disease of native coronary artery with unstable angina pectoris: Secondary | ICD-10-CM

## 2022-07-16 DIAGNOSIS — I200 Unstable angina: Secondary | ICD-10-CM | POA: Diagnosis present

## 2022-07-16 DIAGNOSIS — I2510 Atherosclerotic heart disease of native coronary artery without angina pectoris: Secondary | ICD-10-CM | POA: Diagnosis not present

## 2022-07-16 DIAGNOSIS — R079 Chest pain, unspecified: Secondary | ICD-10-CM | POA: Diagnosis not present

## 2022-07-16 DIAGNOSIS — I5023 Acute on chronic systolic (congestive) heart failure: Secondary | ICD-10-CM | POA: Diagnosis not present

## 2022-07-16 DIAGNOSIS — Z87891 Personal history of nicotine dependence: Secondary | ICD-10-CM | POA: Diagnosis not present

## 2022-07-16 DIAGNOSIS — I110 Hypertensive heart disease with heart failure: Secondary | ICD-10-CM | POA: Diagnosis not present

## 2022-07-16 DIAGNOSIS — I5033 Acute on chronic diastolic (congestive) heart failure: Secondary | ICD-10-CM | POA: Diagnosis not present

## 2022-07-16 DIAGNOSIS — Z955 Presence of coronary angioplasty implant and graft: Secondary | ICD-10-CM | POA: Diagnosis not present

## 2022-07-16 DIAGNOSIS — Z7982 Long term (current) use of aspirin: Secondary | ICD-10-CM | POA: Diagnosis not present

## 2022-07-16 HISTORY — DX: Unstable angina: I20.0

## 2022-07-16 HISTORY — PX: LEFT HEART CATH AND CORONARY ANGIOGRAPHY: CATH118249

## 2022-07-16 LAB — CBC
HCT: 37.2 % (ref 36.0–46.0)
Hemoglobin: 12.2 g/dL (ref 12.0–15.0)
MCH: 27.8 pg (ref 26.0–34.0)
MCHC: 32.8 g/dL (ref 30.0–36.0)
MCV: 84.7 fL (ref 80.0–100.0)
Platelets: 194 10*3/uL (ref 150–400)
RBC: 4.39 MIL/uL (ref 3.87–5.11)
RDW: 12.4 % (ref 11.5–15.5)
WBC: 9 10*3/uL (ref 4.0–10.5)
nRBC: 0 % (ref 0.0–0.2)

## 2022-07-16 LAB — ECHOCARDIOGRAM COMPLETE
Area-P 1/2: 4.79 cm2
Calc EF: 41.4 %
Height: 63 in
S' Lateral: 4.3 cm
Single Plane A2C EF: 43.1 %
Single Plane A4C EF: 44.9 %
Weight: 2865.6 oz

## 2022-07-16 LAB — BASIC METABOLIC PANEL
Anion gap: 8 (ref 5–15)
BUN: 10 mg/dL (ref 6–20)
CO2: 25 mmol/L (ref 22–32)
Calcium: 8.4 mg/dL — ABNORMAL LOW (ref 8.9–10.3)
Chloride: 104 mmol/L (ref 98–111)
Creatinine, Ser: 0.71 mg/dL (ref 0.44–1.00)
GFR, Estimated: >60 mL/min (ref >60)
Glucose, Bld: 91 mg/dL (ref 70–99)
Potassium: 3.1 mmol/L — ABNORMAL LOW (ref 3.5–5.1)
Sodium: 137 mmol/L (ref 135–145)

## 2022-07-16 LAB — BRAIN NATRIURETIC PEPTIDE: B Natriuretic Peptide: 48 pg/mL (ref 0.0–100.0)

## 2022-07-16 LAB — PROTIME-INR
INR: 1 (ref 0.8–1.2)
Prothrombin Time: 13.6 seconds (ref 11.4–15.2)

## 2022-07-16 SURGERY — LEFT HEART CATH AND CORONARY ANGIOGRAPHY
Anesthesia: LOCAL

## 2022-07-16 MED ORDER — FENTANYL CITRATE (PF) 100 MCG/2ML IJ SOLN
INTRAMUSCULAR | Status: AC
  Filled 2022-07-16: qty 2

## 2022-07-16 MED ORDER — POTASSIUM CHLORIDE CRYS ER 20 MEQ PO TBCR
40.0000 meq | EXTENDED_RELEASE_TABLET | Freq: Once | ORAL | Status: AC
  Administered 2022-07-16: 40 meq via ORAL
  Filled 2022-07-16: qty 2

## 2022-07-16 MED ORDER — MIDAZOLAM HCL 2 MG/2ML IJ SOLN
INTRAMUSCULAR | Status: AC
  Filled 2022-07-16: qty 2

## 2022-07-16 MED ORDER — VERAPAMIL HCL 2.5 MG/ML IV SOLN
INTRAVENOUS | Status: AC
  Filled 2022-07-16: qty 2

## 2022-07-16 MED ORDER — HEPARIN (PORCINE) IN NACL 1000-0.9 UT/500ML-% IV SOLN
INTRAVENOUS | Status: DC | PRN
  Administered 2022-07-16 (×2): 500 mL

## 2022-07-16 MED ORDER — UMECLIDINIUM BROMIDE 62.5 MCG/ACT IN AEPB
1.0000 | INHALATION_SPRAY | Freq: Every day | RESPIRATORY_TRACT | Status: DC
  Administered 2022-07-16 – 2022-07-17 (×2): 1 via RESPIRATORY_TRACT
  Filled 2022-07-16: qty 7

## 2022-07-16 MED ORDER — ASPIRIN 81 MG PO CHEW
81.0000 mg | CHEWABLE_TABLET | Freq: Every day | ORAL | Status: DC
  Administered 2022-07-16 – 2022-07-17 (×2): 81 mg via ORAL
  Filled 2022-07-16 (×2): qty 1

## 2022-07-16 MED ORDER — SODIUM CHLORIDE 0.9% FLUSH: 3.0000 mL | INTRAVENOUS | Status: DC | PRN

## 2022-07-16 MED ORDER — PANTOPRAZOLE SODIUM 40 MG PO TBEC
40.0000 mg | DELAYED_RELEASE_TABLET | Freq: Every day | ORAL | Status: DC
  Administered 2022-07-16 – 2022-07-17 (×2): 40 mg via ORAL
  Filled 2022-07-16 (×2): qty 1

## 2022-07-16 MED ORDER — LIDOCAINE HCL (PF) 1 % IJ SOLN
INTRAMUSCULAR | Status: DC | PRN
  Administered 2022-07-16: 2 mL via INTRADERMAL

## 2022-07-16 MED ORDER — FUROSEMIDE 10 MG/ML IJ SOLN
40.0000 mg | Freq: Two times a day (BID) | INTRAMUSCULAR | Status: DC
  Administered 2022-07-16 – 2022-07-17 (×2): 40 mg via INTRAVENOUS
  Filled 2022-07-16 (×2): qty 4

## 2022-07-16 MED ORDER — EZETIMIBE 10 MG PO TABS
10.0000 mg | ORAL_TABLET | Freq: Every day | ORAL | Status: DC
  Administered 2022-07-16 – 2022-07-17 (×2): 10 mg via ORAL
  Filled 2022-07-16 (×2): qty 1

## 2022-07-16 MED ORDER — ONDANSETRON HCL 4 MG/2ML IJ SOLN: 4.0000 mg | Freq: Four times a day (QID) | INTRAMUSCULAR | Status: DC | PRN

## 2022-07-16 MED ORDER — LIDOCAINE HCL (PF) 1 % IJ SOLN
INTRAMUSCULAR | Status: AC
  Filled 2022-07-16: qty 30

## 2022-07-16 MED ORDER — IOHEXOL 350 MG/ML SOLN
INTRAVENOUS | Status: DC | PRN
  Administered 2022-07-16: 45 mL

## 2022-07-16 MED ORDER — FUROSEMIDE 10 MG/ML IJ SOLN
20.0000 mg | Freq: Once | INTRAMUSCULAR | Status: AC
  Administered 2022-07-16: 20 mg via INTRAVENOUS
  Filled 2022-07-16: qty 2

## 2022-07-16 MED ORDER — HEPARIN SODIUM (PORCINE) 1000 UNIT/ML IJ SOLN
INTRAMUSCULAR | Status: DC | PRN
  Administered 2022-07-16: 4000 [IU] via INTRAVENOUS

## 2022-07-16 MED ORDER — MIDAZOLAM HCL 2 MG/2ML IJ SOLN
INTRAMUSCULAR | Status: DC | PRN
  Administered 2022-07-16: 1 mg via INTRAVENOUS

## 2022-07-16 MED ORDER — TICAGRELOR 90 MG PO TABS: 90.0000 mg | ORAL_TABLET | Freq: Two times a day (BID) | ORAL | Status: DC

## 2022-07-16 MED ORDER — CITALOPRAM HYDROBROMIDE 20 MG PO TABS
40.0000 mg | ORAL_TABLET | Freq: Every day | ORAL | Status: DC
  Administered 2022-07-16 – 2022-07-17 (×2): 40 mg via ORAL
  Filled 2022-07-16 (×2): qty 2

## 2022-07-16 MED ORDER — VERAPAMIL HCL 2.5 MG/ML IV SOLN
INTRAVENOUS | Status: DC | PRN
  Administered 2022-07-16: 10 mL via INTRA_ARTERIAL

## 2022-07-16 MED ORDER — ROSUVASTATIN CALCIUM 20 MG PO TABS
40.0000 mg | ORAL_TABLET | Freq: Every day | ORAL | Status: DC
  Administered 2022-07-16 – 2022-07-17 (×2): 40 mg via ORAL
  Filled 2022-07-16 (×2): qty 2

## 2022-07-16 MED ORDER — SPIRONOLACTONE 12.5 MG HALF TABLET
12.5000 mg | ORAL_TABLET | Freq: Every day | ORAL | Status: DC
  Administered 2022-07-16: 12.5 mg via ORAL
  Filled 2022-07-16: qty 1

## 2022-07-16 MED ORDER — HEPARIN SODIUM (PORCINE) 1000 UNIT/ML IJ SOLN
INTRAMUSCULAR | Status: AC
  Filled 2022-07-16: qty 10

## 2022-07-16 MED ORDER — METHYLPREDNISOLONE SODIUM SUCC 125 MG IJ SOLR
125.0000 mg | Freq: Once | INTRAMUSCULAR | Status: AC
  Administered 2022-07-16: 125 mg via INTRAVENOUS
  Filled 2022-07-16: qty 2

## 2022-07-16 MED ORDER — SODIUM CHLORIDE 0.9 % IV SOLN: INTRAVENOUS | Status: AC

## 2022-07-16 MED ORDER — FLUTICASONE FUROATE-VILANTEROL 100-25 MCG/ACT IN AEPB
1.0000 | INHALATION_SPRAY | Freq: Every day | RESPIRATORY_TRACT | Status: DC
  Administered 2022-07-16 – 2022-07-17 (×2): 1 via RESPIRATORY_TRACT
  Filled 2022-07-16: qty 28

## 2022-07-16 MED ORDER — PERFLUTREN LIPID MICROSPHERE
1.0000 mL | INTRAVENOUS | Status: AC | PRN
  Administered 2022-07-16: 2 mL via INTRAVENOUS

## 2022-07-16 MED ORDER — SODIUM CHLORIDE 0.9 % IV SOLN: 250.0000 mL | INTRAVENOUS | Status: DC | PRN

## 2022-07-16 MED ORDER — DAPAGLIFLOZIN PROPANEDIOL 10 MG PO TABS
10.0000 mg | ORAL_TABLET | Freq: Every day | ORAL | Status: DC
  Administered 2022-07-16 – 2022-07-17 (×2): 10 mg via ORAL
  Filled 2022-07-16 (×2): qty 1

## 2022-07-16 MED ORDER — TOPIRAMATE 25 MG PO TABS
50.0000 mg | ORAL_TABLET | Freq: Every day | ORAL | Status: DC
  Administered 2022-07-16 – 2022-07-17 (×2): 50 mg via ORAL
  Filled 2022-07-16 (×2): qty 2

## 2022-07-16 MED ORDER — SODIUM CHLORIDE 0.9 % WEIGHT BASED INFUSION
3.0000 mL/kg/h | INTRAVENOUS | Status: DC
  Administered 2022-07-16: 3 mL/kg/h via INTRAVENOUS

## 2022-07-16 MED ORDER — SODIUM CHLORIDE 0.9% FLUSH
3.0000 mL | Freq: Two times a day (BID) | INTRAVENOUS | Status: DC
  Administered 2022-07-17: 3 mL via INTRAVENOUS

## 2022-07-16 MED ORDER — SODIUM CHLORIDE 0.9 % WEIGHT BASED INFUSION
1.0000 mL/kg/h | INTRAVENOUS | Status: DC
  Administered 2022-07-16: 1 mL/kg/h via INTRAVENOUS

## 2022-07-16 MED ORDER — BUPRENORPHINE HCL-NALOXONE HCL 8-2 MG SL SUBL
1.0000 | SUBLINGUAL_TABLET | Freq: Two times a day (BID) | SUBLINGUAL | Status: DC
  Administered 2022-07-16: 1 via SUBLINGUAL
  Filled 2022-07-16 (×2): qty 1

## 2022-07-16 MED ORDER — FENTANYL CITRATE (PF) 100 MCG/2ML IJ SOLN
INTRAMUSCULAR | Status: DC | PRN
  Administered 2022-07-16: 25 ug via INTRAVENOUS

## 2022-07-16 MED ORDER — POTASSIUM CHLORIDE CRYS ER 20 MEQ PO TBCR
60.0000 meq | EXTENDED_RELEASE_TABLET | Freq: Once | ORAL | Status: AC
  Administered 2022-07-16: 60 meq via ORAL
  Filled 2022-07-16: qty 3

## 2022-07-16 MED ORDER — DIPHENHYDRAMINE HCL 50 MG/ML IJ SOLN
25.0000 mg | Freq: Once | INTRAMUSCULAR | Status: AC
  Administered 2022-07-16: 25 mg via INTRAVENOUS
  Filled 2022-07-16: qty 1

## 2022-07-16 MED ORDER — NITROGLYCERIN 0.4 MG SL SUBL
0.4000 mg | SUBLINGUAL_TABLET | SUBLINGUAL | Status: DC | PRN
  Administered 2022-07-16: 0.4 mg via SUBLINGUAL
  Filled 2022-07-16: qty 1

## 2022-07-16 SURGICAL SUPPLY — 10 items
CATH 5FR JL3.5 JR4 ANG PIG MP (CATHETERS) IMPLANT
DEVICE RAD COMP TR BAND LRG (VASCULAR PRODUCTS) IMPLANT
GLIDESHEATH SLEND SS 6F .021 (SHEATH) IMPLANT
GUIDEWIRE INQWIRE 1.5J.035X260 (WIRE) IMPLANT
INQWIRE 1.5J .035X260CM (WIRE) ×1
KIT HEART LEFT (KITS) ×1 IMPLANT
PACK CARDIAC CATHETERIZATION (CUSTOM PROCEDURE TRAY) ×1 IMPLANT
SYR MEDRAD MARK 7 150ML (SYRINGE) ×1 IMPLANT
TRANSDUCER W/STOPCOCK (MISCELLANEOUS) ×1 IMPLANT
TUBING CIL FLEX 10 FLL-RA (TUBING) ×1 IMPLANT

## 2022-07-16 NOTE — Plan of Care (Signed)

## 2022-07-16 NOTE — Progress Notes (Signed)
  Echocardiogram 2D Echocardiogram has been performed.  Janalyn Harder 07/16/2022, 3:17 PM

## 2022-07-16 NOTE — H&P (Signed)
Cardiology Admission History and Physical   Patient ID: Maria Ellis MRN: [id number]; DOB: [birth year]   Admission date: 07/15/2022  PCP:  Crist Fat, MD   [facility] Providers Cardiologist:  Martyn Malay here to update MD or APP on Care Team, Refresh:1}     Chief Complaint: Chest pain  Patient Profile:   Maria Ellis is a 37 y.o. female with hypertension, hyperlipidemia, and coronary artery disease with prior cardiac arrest and subsequent PCI to LAD who is being seen 07/16/2022 for the evaluation of chest pain.  History of Present Illness:   Maria Ellis is 8 with history of early onset coronary disease with LAD occlusion and cardiac arrest in 2023 followed by PCI and she also has hypertension hyperlipidemia.  She removed usual state of health however developed chest pain the evening yesterday.  States that it was similar to her prior episodes of chest pain that happened days before her cardiac arrest.  It was intermittently coming and going and worried her enough to come to the emergency department immediately.  In the emergency department, initial workup was unremarkable with a troponin that ruled out for acute coronary syndrome.  She denied any other symptoms.  Given her history and ongoing intermittent chest pain, she is being admitted for observation.  She also notes that she has had 7 lb weight gain in the last 3 days.   Past Medical History:  Diagnosis Date   Bell's palsy    Headache    High cholesterol    History of Chiari malformation    Hydrocephalus (HCC)    Myocardial infarct [facility])     Past Surgical History:  Procedure Laterality Date   BRAIN SURGERY     CORONARY ANGIOPLASTY WITH STENT PLACEMENT     CORONARY/GRAFT ACUTE MI REVASCULARIZATION N/A 03/20/2021   Procedure: Coronary/Graft Acute MI Revascularization;  Surgeon: Marykay Lex, MD;  Location: [facility] INVASIVE CV LAB;  Service: Cardiovascular;  Laterality: N/A;   LAPAROSCOPIC APPENDECTOMY  Left 05/18/2013   Procedure: APPENDECTOMY LAPAROSCOPIC;  Surgeon: Velora Heckler, MD;  Location: [facility] ORS;  Service: General;  Laterality: Left;   LEFT HEART CATH AND CORONARY ANGIOGRAPHY N/A 03/20/2021   Procedure: LEFT HEART CATH AND CORONARY ANGIOGRAPHY;  Surgeon: Marykay Lex, MD;  Location: [facility] INVASIVE CV LAB;  Service: Cardiovascular;  Laterality: N/A;   MULTIPLE TOOTH EXTRACTIONS     SHUNT REPLACEMENT     x 2   TONSILECTOMY, ADENOIDECTOMY, BILATERAL MYRINGOTOMY AND TUBES       Medications Prior to Admission: Prior to Admission medications   Medication Sig Start Date End Date Taking? Authorizing Provider  AIMOVIG 70 MG/ML SOAJ Apply 70 mg topically every 30 (thirty) days. 01/03/21   [provider]  aspirin 81 MG chewable tablet Chew 1 tablet (81 mg total) by mouth daily. 03/25/21   Azalee Course, PA  Cholecalciferol (VITAMIN D3) 1.25 MG (50000 UT) CAPS Take 1 capsule by mouth every 30 (thirty) days. 07/15/21   [provider]  citalopram (CELEXA) 40 MG tablet Take 1 tablet (40 mg total) by mouth daily. 03/05/22   Crist Fat, MD  dapagliflozin propanediol (FARXIGA) 10 MG TABS tablet Take 1 tablet (10 mg total) by mouth daily. 02/28/22   Baldo Daub, MD  ezetimibe (ZETIA) 10 MG tablet TAKE 1 TABLET BY MOUTH EVERY DAY 03/10/22   Azalee Course, PA  fluticasone [facility]) 50 MCG/ACT nasal spray Place 1 spray into both nostrils in the morning and  at bedtime. 03/05/22   Crist Fat, MD  hydrocortisone cream (HYDROCORTISONE ANTI-ITCH) 1 % Apply 1 Application topically 2 (two) times daily as needed for itching.    [provider]  nitroGLYCERIN (NITROSTAT) 0.4 MG SL tablet Place 1 tablet (0.4 mg total) under the tongue every 5 (five) minutes as needed for chest pain. 03/24/21   Azalee Course, PA  olopatadine (PATANOL) 0.1 % ophthalmic solution Place 1 drop into both eyes 2 (two) times daily as needed for allergies.    [provider]  omeprazole (PRILOSEC) 20 MG capsule  Take 1 capsule (20 mg total) by mouth daily. 03/05/22   Crist Fat, MD  ondansetron (ZOFRAN-ODT) 4 MG disintegrating tablet Take 4 mg by mouth 2 (two) times daily as needed for nausea/vomiting. 02/24/21   [provider]  PRESCRIPTION MEDICATION Take 1 tablet by mouth as needed (migraine). Nurtec - Samples    [provider]  rosuvastatin (CRESTOR) 40 MG tablet TAKE 1 TABLET BY MOUTH EVERY DAY 03/25/22   Baldo Daub, MD  spironolactone (ALDACTONE) 25 MG tablet TAKE 0.5 TABLETS BY MOUTH AT BEDTIME. 06/24/22   Munley, Iline Oven, MD  SUBOXONE 8-2 MG FILM Place 1 strip under the tongue daily as needed (withdrawal). 03/06/21   [provider]  ticagrelor (BRILINTA) 90 MG TABS tablet Take 1 tablet (90 mg total) by mouth 2 (two) times daily. 02/28/22   Baldo Daub, MD  topiramate (TOPAMAX) 50 MG tablet Take 1 tablet (50 mg total) by mouth daily. 03/05/22   Crist Fat, MD  TRELEGY ELLIPTA 100-62.5-25 MCG/ACT AEPB Inhale 1 puff into the lungs daily. 03/05/22   Crist Fat, MD     Allergies:    Allergies  Allergen Reactions   Penicillins Anaphylaxis and Swelling        Iodinated Contrast Media Other (See Comments)    Body felt like it was on fire.   Multihance [Gadobenate] Nausea Only and Cough    PT HAD SNEEZING AND NAUSEA IMMEDIATELY AFTER CONTRAST INJECTION.     Tylenol [Acetaminophen] Rash and Other (See Comments)    Stomach cramping    Social History:   Social History   Socioeconomic History   Marital status: Single    Spouse name: Not on file   Number of children: 0   Years of education: 12   Highest education level: High school graduate  Occupational History   Occupation: Disabled  Tobacco Use   Smoking status: Former    Years: 21    Types: Cigarettes    Quit date: 03/20/2021    Years since quitting: 1.3    Passive exposure: Past   Smokeless tobacco: Never   Tobacco comments:    Quit after her MI in July 2023  Vaping Use   Vaping Use: Never  used  Substance and Sexual Activity   Alcohol use: Yes    Comment: occ   Drug use: Yes    Types: Marijuana    Comment: July 2023   Sexual activity: Yes    Birth control/protection: None  Other Topics Concern   Not on file  Social History Narrative   Lives at home with her mother.   Left-handed.   8-9 cups caffeine per day.   Social Determinants of Health   Financial Resource Strain: Low Risk  (08/27/2021)   Overall Financial Resource Strain (CARDIA)    Difficulty of Paying Living Expenses: Not very hard  Food Insecurity: No Food Insecurity (07/16/2022)  Hunger Vital Sign    Worried About Running Out of Food in the Last Year: Never true    Ran Out of Food in the Last Year: Never true  Transportation Needs: No Transportation Needs (07/16/2022)   PRAPARE - Administrator, Civil Service (Medical): No    Lack of Transportation (Non-Medical): No  Physical Activity: Not on file  Stress: Not on file  Social Connections: Not on file  Intimate Partner Violence: Not At Risk (07/16/2022)   Humiliation, Afraid, Rape, and Kick questionnaire    Fear of Current or Ex-Partner: No    Emotionally Abused: No    Physically Abused: No    Sexually Abused: No    Family History:   The patient's family history includes Cirrhosis in her father; Diabetes in her father and mother; Heart failure in her father.    ROS:  Please see the history of present illness.  All other ROS reviewed and negative.     Physical Exam/Data:   Vitals:   07/16/22 0130 07/16/22 0230 07/16/22 0330 07/16/22 0429  BP: 101/64 102/66 102/66 99/60  Pulse:  63 60 63  Resp: 20 17 16 18   Temp:   98.3 F (36.8 C) 98.1 F (36.7 C)  TempSrc:   Oral Oral  SpO2:  94% 96% 98%  Weight:    81.2 kg  Height:    5\' 3"  (1.6 m)   No intake or output data in the 24 hours ending 07/16/22 0504    07/16/2022    4:29 AM 07/15/2022    6:14 PM 02/19/2022    1:16 PM  Last 3 Weights  Weight (lbs) 179 lb 1.6 oz 173 lb 172 lb   Weight (kg) 81.239 kg 78.472 kg 78.019 kg     Body mass index is 31.73 kg/m.  General:  Well nourished, well developed, in no acute distress HEENT: normal Neck: JVD to mid neck Vascular: No carotid bruits; Distal pulses 2+ bilaterally   Cardiac:  normal S1, S2; RRR; no murmur  Lungs:  clear to auscultation bilaterally, no wheezing, rhonchi or rales  Abd: soft, nontender, no hepatomegaly  Ext: no edema Musculoskeletal:  No deformities, BUE and BLE strength normal and equal Skin: warm and dry  Neuro:  CNs 2-12 intact, no focal abnormalities noted Psych:  Normal affect    EKG:  The ECG that was done initially was personally reviewed and demonstrates no ST or T wave changes  Relevant CV Studies: Coronary angiography 03/20/2021: SUMMARY Cardiac arrest with ventricular fibrillation and ROSC secondary to Anterior STEMI Severe single-vessel CAD with 100% thrombotic occlusion of the LAD just after D1 Successful DES PCI of LAD restoring TIMI-3 flow using Synergy XD DES 2.5 x 28 mm stent postdilated to 3.1 mm. Acute combined systolic and diastolic heart failure: EF estimated roughly 40% by LV gram with EDP of 28 to 30 mmHg.  Echo 02/21/2022: 1. GLS -14.8. Left ventricular ejection fraction, by estimation, is 40 to  45%. The left ventricle has mildly decreased function. The left ventricle  has no regional wall motion abnormalities. The left ventricular internal  cavity size was mildly dilated.  Left ventricular diastolic parameters were normal. There is moderate  hypokinesis of the left ventricular, mid-apical anterior wall and  anteroseptal wall.   2. Right ventricular systolic function is normal. The right ventricular  size is normal.   3. The mitral valve is normal in structure. Mild mitral valve  regurgitation. No evidence of mitral stenosis.  4. The aortic valve is normal in structure. Aortic valve regurgitation is  not visualized. No aortic stenosis is present.   5. The inferior  vena cava is normal in size with greater than 50%  respiratory variability, suggesting right atrial pressure of 3 mmHg.   Laboratory Data:  High Sensitivity Troponin:   Recent Labs  Lab 07/15/22 1855 07/15/22 2045  TROPONINIHS <2 <2      Chemistry Recent Labs  Lab 07/15/22 1855  NA 135  K 3.3*  CL 101  CO2 24  GLUCOSE 90  BUN 11  CREATININE 0.64  CALCIUM 8.4*  GFRNONAA >60  ANIONGAP 10    Recent Labs  Lab 07/15/22 1855  PROT 6.5  ALBUMIN 3.9  AST 21  ALT 23  ALKPHOS 60  BILITOT 0.5   Lipids No results for input(s): "CHOL", "TRIG", "HDL", "LABVLDL", "LDLCALC", "CHOLHDL" in the last 168 hours. Hematology Recent Labs  Lab 07/15/22 1855  WBC 9.5  RBC 4.43  HGB 12.6  HCT 37.8  MCV 85.3  MCH 28.4  MCHC 33.3  RDW 12.5  PLT 209   Thyroid No results for input(s): "TSH", "FREET4" in the last 168 hours. BNPNo results for input(s): "BNP", "PROBNP" in the last 168 hours.  DDimer No results for input(s): "DDIMER" in the last 168 hours.   Radiology/Studies:  DG Chest 2 View  Result Date: 07/15/2022 CLINICAL DATA:  chest pain EXAM: CHEST - 2 VIEW COMPARISON:  07/29/2021 no pneumothorax. FINDINGS: Lungs are clear. Heart size and mediastinal contours are within normal limits. No effusion. Visualized bones unremarkable. Presumed VP shunt tubing partially seen as before. IMPRESSION: No acute cardiopulmonary disease. Electronically Signed   By: Corlis Leak M.D.   On: 07/15/2022 18:42     Assessment and Plan:   Chest pain concerning for unstable angina She had single-vessel coronary artery disease and 2023 that ultimately resulted in cardiac arrest.  She had her proximal LAD stented at that time and has been in decent health since then.  She does have reduced EF subsequently.  She is having very typical angina intermittently and this is similar to prior MI event. The ability to add medical therapy is limited currently by her blood pressures.  Given her ongoing pain and  concerning symptoms, I think she warrants coronary evaluation.  -Continue aspirin 81 mg - Continue rosuvastatin 40 and Zetia 10 mg - As needed nitroglycerin sublingual - NPO now, plan for LHC  2.  Heart failure with reduced ejection fraction Ischemic cardiomyopathy with reduced ejection fraction. She does appear to be volume up and has been taking more doses of spironolactone (per her cardiologist). given acuity and history we will repeat an echo to be sure there are no changes that could be contributing to her symptoms. - Continue Farxiga - Continue spironolactone - order lasix IV 20 mg now   3.  GERD - Continue PPI   Risk Assessment/Risk Scores:    TIMI Risk Score for Unstable Angina or Non-ST Elevation MI:   The patient's TIMI risk score is 3, which indicates a 13% risk of all cause mortality, new or recurrent myocardial infarction or need for urgent revascularization in the next 14 days.       Severity of Illness: The appropriate patient status for this patient is OBSERVATION. Observation status is judged to be reasonable and necessary in order to provide the required intensity of service to ensure the patient's safety. The patient's presenting symptoms, physical exam findings, and initial radiographic and  laboratory data in the context of their medical condition is felt to place them at decreased risk for further clinical deterioration. Furthermore, it is anticipated that the patient will be medically stable for discharge from the hospital within 2 midnights of admission.    For questions or updates, please contact [facility] Please consult [email or web address] for contact info under     Signed, Joellen Jersey, MD  07/16/2022 5:04 AM

## 2022-07-16 NOTE — ED Notes (Signed)
[facility] called for transport @ 01:40

## 2022-07-16 NOTE — Progress Notes (Signed)
   Patient Name: Maria Ellis Date of Encounter: 07/16/2022 [facility] Cardiologist: None   Interval Summary  .    This morning patient without chest pain, shortness of breath, palpitations. She clarifies with me that pain felt yesterday was sharp in nature and brought on by exertion/relieved with rest. She was doing laundry when pain began.   Vital Signs .    Vitals:   07/16/22 0130 07/16/22 0230 07/16/22 0330 07/16/22 0429  BP: 101/64 102/66 102/66 99/60  Pulse:  63 60 63  Resp: 20 17 16 18   Temp:   98.3 F (36.8 C) 98.1 F (36.7 C)  TempSrc:   Oral Oral  SpO2:  94% 96% 98%  Weight:    81.2 kg  Height:    5\' 3"  (1.6 m)   No intake or output data in the 24 hours ending 07/16/22 0716    07/16/2022    4:29 AM 07/15/2022    6:14 PM 02/19/2022    1:16 PM  Last 3 Weights  Weight (lbs) 179 lb 1.6 oz 173 lb 172 lb  Weight (kg) 81.239 kg 78.472 kg 78.019 kg      Telemetry/ECG    Sinus rhythm - Personally Reviewed  Physical Exam .   GEN: No acute distress.   Neck: No JVD Cardiac: RRR, no murmurs, rubs, or gallops.  Respiratory: Clear to auscultation bilaterally. GI: Soft, nontender, non-distended  MS: No edema  Assessment & Plan .     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 Had cardiac arrest in 2023 and was found with proximal LAD occlusion, received successful DES. Yesterday developed chest pain with exertion/relieved with rest that was nearly identical to pain experienced prior to cardiac arrest last year.   Cardiac workup thus far reassuring with non-ischemic ECG and negative troponin. However, given recent history of stenting and patient feeling as though current symptoms mimic those felt prior to last year's event, would be reasonable to evaluate stent patency if patient were to continue having symptoms despite diuresis, (suspect symptoms may be volume mediated  given ~7lbs weight gain in 3 days).  Acute on chronic HFrEF  Patient with LVEF 35-40% following proximal LAD occlusion, improved to 40-45% as of February 2024 TTE. 7 pound weight gain in 3 days per patient.   Admitting provider with suspicion of increased volume. Will check BNP and repeat TTE. IV lasix 20mg  ordered by admitting provider for AM dosing. PTA GDMT: Farxiga 10mg . Continue this admission Spironolactone 12.5mg . Continue this admission Not on beta blocker or Entresto/ARB due to low BP  Hyperlipidemia  LDL at goal, continue Crestor 40mg , Zetia 10mg .   Lab Results  Component Value Date   LDLCALC 30 02/19/2022     Gastroesophageal reflux For questions or updates, please contact [facility] Please consult [email or web address] for contact info under        Signed, Perlie Gold, PA-C

## 2022-07-17 ENCOUNTER — Encounter (HOSPITAL_COMMUNITY): Payer: Self-pay | Admitting: Cardiovascular Disease

## 2022-07-17 ENCOUNTER — Other Ambulatory Visit (HOSPITAL_COMMUNITY): Payer: Self-pay

## 2022-07-17 ENCOUNTER — Other Ambulatory Visit: Payer: Self-pay | Admitting: Cardiology

## 2022-07-17 DIAGNOSIS — I5023 Acute on chronic systolic (congestive) heart failure: Secondary | ICD-10-CM | POA: Diagnosis not present

## 2022-07-17 DIAGNOSIS — I5033 Acute on chronic diastolic (congestive) heart failure: Secondary | ICD-10-CM | POA: Diagnosis not present

## 2022-07-17 DIAGNOSIS — Z7982 Long term (current) use of aspirin: Secondary | ICD-10-CM | POA: Diagnosis not present

## 2022-07-17 DIAGNOSIS — R079 Chest pain, unspecified: Secondary | ICD-10-CM | POA: Diagnosis not present

## 2022-07-17 DIAGNOSIS — Z955 Presence of coronary angioplasty implant and graft: Secondary | ICD-10-CM | POA: Diagnosis not present

## 2022-07-17 DIAGNOSIS — E876 Hypokalemia: Secondary | ICD-10-CM

## 2022-07-17 DIAGNOSIS — I110 Hypertensive heart disease with heart failure: Secondary | ICD-10-CM | POA: Diagnosis not present

## 2022-07-17 DIAGNOSIS — I25110 Atherosclerotic heart disease of native coronary artery with unstable angina pectoris: Secondary | ICD-10-CM | POA: Diagnosis not present

## 2022-07-17 DIAGNOSIS — Z87891 Personal history of nicotine dependence: Secondary | ICD-10-CM | POA: Diagnosis not present

## 2022-07-17 HISTORY — DX: Acute on chronic systolic (congestive) heart failure: I50.23

## 2022-07-17 LAB — CBC
HCT: 42 % (ref 36.0–46.0)
Hemoglobin: 14 g/dL (ref 12.0–15.0)
MCH: 28.2 pg (ref 26.0–34.0)
MCHC: 33.3 g/dL (ref 30.0–36.0)
MCV: 84.5 fL (ref 80.0–100.0)
Platelets: 234 10*3/uL (ref 150–400)
RBC: 4.97 MIL/uL (ref 3.87–5.11)
RDW: 12.5 % (ref 11.5–15.5)
WBC: 11.9 10*3/uL — ABNORMAL HIGH (ref 4.0–10.5)
nRBC: 0 % (ref 0.0–0.2)

## 2022-07-17 LAB — BASIC METABOLIC PANEL
Anion gap: 12 (ref 5–15)
BUN: 20 mg/dL (ref 6–20)
CO2: 20 mmol/L — ABNORMAL LOW (ref 22–32)
Calcium: 9.1 mg/dL (ref 8.9–10.3)
Chloride: 101 mmol/L (ref 98–111)
Creatinine, Ser: 0.91 mg/dL (ref 0.44–1.00)
GFR, Estimated: >60 mL/min (ref >60)
Glucose, Bld: 205 mg/dL — ABNORMAL HIGH (ref 70–99)
Potassium: 2.9 mmol/L — ABNORMAL LOW (ref 3.5–5.1)
Sodium: 133 mmol/L — ABNORMAL LOW (ref 135–145)

## 2022-07-17 LAB — MAGNESIUM: Magnesium: 1.8 mg/dL (ref 1.7–2.4)

## 2022-07-17 MED ORDER — POTASSIUM CHLORIDE CRYS ER 20 MEQ PO TBCR: 40.0000 meq | EXTENDED_RELEASE_TABLET | Freq: Three times a day (TID) | ORAL | Status: DC

## 2022-07-17 MED ORDER — MAGNESIUM SULFATE 2 GM/50ML IV SOLN
2.0000 g | Freq: Once | INTRAVENOUS | Status: AC
  Administered 2022-07-17: 2 g via INTRAVENOUS
  Filled 2022-07-17: qty 50

## 2022-07-17 MED ORDER — POTASSIUM CHLORIDE CRYS ER 20 MEQ PO TBCR
40.0000 meq | EXTENDED_RELEASE_TABLET | ORAL | Status: AC
  Administered 2022-07-17 (×3): 40 meq via ORAL
  Filled 2022-07-17 (×3): qty 2

## 2022-07-17 MED ORDER — POTASSIUM CHLORIDE CRYS ER 20 MEQ PO TBCR
20.0000 meq | EXTENDED_RELEASE_TABLET | Freq: Every day | ORAL | 2 refills | Status: DC | PRN
  Filled 2022-07-17: qty 30, 30d supply, fill #0

## 2022-07-17 MED ORDER — FUROSEMIDE 40 MG PO TABS
40.0000 mg | ORAL_TABLET | Freq: Every day | ORAL | 3 refills | Status: DC | PRN
  Filled 2022-07-17: qty 30, 30d supply, fill #0

## 2022-07-17 NOTE — Discharge Summary (Signed)
Discharge Summary    Patient ID: Maria Ellis MRN: [id number]; DOB: Apr 21, 1985  Admit date: 07/15/2022 Discharge date: 07/17/2022  PCP:  Maria Fat, MD   [facility] Providers Cardiologist:  None        Discharge Diagnoses    Principal Problem:   Chest pain Active Problems:   Unstable angina [facility])    Diagnostic Studies/Procedures    07/16/22 TTE  IMPRESSIONS     1. Left ventricular ejection fraction, by estimation, is 40 to 45%. The  left ventricle has mildly decreased function. The left ventricle  demonstrates regional wall motion abnormalities (see scoring  diagram/findings for description). Left ventricular  diastolic parameters were normal.   2. Right ventricular systolic function is normal. The right ventricular  size is normal. Tricuspid regurgitation signal is inadequate for assessing  PA pressure.   3. The mitral valve is grossly normal. Trivial mitral valve  regurgitation. No evidence of mitral stenosis.   4. The aortic valve was not well visualized. Aortic valve regurgitation  is not visualized. No aortic stenosis is present.   5. The inferior vena cava is normal in size with greater than 50%  respiratory variability, suggesting right atrial pressure of 3 mmHg.   Comparison(s): No significant change from prior study.   Conclusion(s)/Recommendation(s): Findings consistent with ischemic  cardiomyopathy.   FINDINGS   Left Ventricle: Left ventricular ejection fraction, by estimation, is 40  to 45%. The left ventricle has mildly decreased function. The left  ventricle demonstrates regional wall motion abnormalities. Definity  contrast agent was given IV to delineate the  left ventricular endocardial borders. The left ventricular internal cavity  size was normal in size. There is no left ventricular hypertrophy. Left  ventricular diastolic parameters were normal.     LV Wall Scoring:  The mid inferoseptal segment, apical septal segment,  apical inferior  segment,  and apex are hypokinetic.   Right Ventricle: The right ventricular size is normal. No increase in  right ventricular wall thickness. Right ventricular systolic function is  normal. Tricuspid regurgitation signal is inadequate for assessing PA  pressure.   Left Atrium: Left atrial size was normal in size.   Right Atrium: Right atrial size was normal in size.   Pericardium: There is no evidence of pericardial effusion.   Mitral Valve: The mitral valve is grossly normal. Trivial mitral valve  regurgitation. No evidence of mitral valve stenosis.   Tricuspid Valve: The tricuspid valve is grossly normal. Tricuspid valve  regurgitation is trivial. No evidence of tricuspid stenosis.   Aortic Valve: The aortic valve was not well visualized. Aortic valve  regurgitation is not visualized. No aortic stenosis is present.   Pulmonic Valve: The pulmonic valve was grossly normal. Pulmonic valve  regurgitation is not visualized. No evidence of pulmonic stenosis.   Aorta: The aortic root and ascending aorta are structurally normal, with  no evidence of dilitation.   Venous: The inferior vena cava is normal in size with greater than 50%  respiratory variability, suggesting right atrial pressure of 3 mmHg.   IAS/Shunts: The atrial septum is grossly normal.    07/16/22 LHC    Previously placed Mid LAD stent of unknown type is  widely patent.   There is mild left ventricular systolic dysfunction.   LV end diastolic pressure is moderately elevated.   The left ventricular ejection fraction is 35-45% by visual estimate.   Patent mid LAD stent without restenosis No obstructive disease in the Circumflex or RCA  LVEDP 24 mmHg _____________   History of Present Illness     Maria Ellis is a 37 y.o. female with hypertension, hyperlipidemia, and coronary artery disease with prior cardiac arrest and subsequent PCI to LAD. Patient presented to the ED for evaluation after  developing exertional chest tightness that resembled pain felt prior to previous cardiac arrest.   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 In the last few days, patient with exertional chest pain similar to pain experienced last year prior to cardiac arrest.   LHC with patent LAD stent, mild LV systolic dysfunction. No obstructive disease in LCX or RCA Continue ASA 81mg .    Acute on chronic HFrEF   Patient with LVEF 35-40% following proximal LAD occlusion, improved to 40-45% as of February 2024 TTE. 7 pound weight gain in 3 days per patient prior to arrival.    BNP this admission normal at 48 despite evidence of volume retention. LVEDP 24 mmHg on LHC.  Repeat TTE showed stable LVEF of 40-45% Patient has now received IV lasix 20mg  x1, 40mg  x2 and reports that she feels much improved. No chest pain or shortness of breath this morning. Cause of volume increase unclear, suspect accidental increased ingestion of salty foods. Will discharge with Lasix 40mg  PRN for weight gain >3 pounds in 24 hours along with Potassium to be taken with lasix. GDMT: Farxiga 10mg  Spironolactone 12.5mg  Not currently on beta blocker or Entresto/ARB due to low BP. Need to consider adding low dose ARB in outpatient setting if BP tolerates.    Hyperlipidemia   LDL at goal, continue with Crestor 40mg , Zetia 10mg .        Did the patient have an acute coronary syndrome (MI, NSTEMI, STEMI, etc) this admission?:  No                               Did the patient have a percutaneous coronary intervention (stent / angioplasty)?:  No.        The patient will be scheduled for a TOC follow up appointment in 14 days.  A message has been sent to the [facility] and Scheduling Pool at the office where the patient should be seen for follow  up.  _____________  Discharge Vitals Blood pressure (!) 94/52, pulse 60, temperature 98.2 F (36.8 C), temperature source Oral, resp. rate 15, height 5\' 3"  (1.6 m), weight 81.2 kg, SpO2 97 %.  Filed Weights   07/15/22 1814 07/16/22 0429  Weight: 78.5 kg 81.2 kg   Physical Exam Vitals reviewed.  Constitutional:      Appearance: She is well-developed.  HENT:     Head: Normocephalic.  Cardiovascular:     Rate and Rhythm: Normal rate and regular rhythm.     Heart sounds: Normal heart sounds.  Pulmonary:     Effort: Pulmonary effort is normal.     Breath sounds: Normal breath sounds.  Abdominal:     General: Bowel sounds are normal.     Palpations: Abdomen is soft.  Musculoskeletal:        General: Normal range of motion.     Right lower leg: No edema.     Left lower leg: No edema.  Skin:    General: Skin is  warm and dry.     Capillary Refill: Capillary refill takes less than 2 seconds.  Neurological:     General: No focal deficit present.     Mental Status: She is alert and oriented to person, place, and time.  Psychiatric:        Mood and Affect: Mood normal.        Behavior: Behavior normal.      Labs & Radiologic Studies    CBC Recent Labs    07/16/22 0636 07/17/22 0807  WBC 9.0 11.9*  HGB 12.2 14.0  HCT 37.2 42.0  MCV 84.7 84.5  PLT 194 234   Basic Metabolic Panel Recent Labs    16/10/96 0636 07/17/22 0803 07/17/22 0807  NA 137  --  133*  K 3.1*  --  2.9*  CL 104  --  101  CO2 25  --  20*  GLUCOSE 91  --  205*  BUN 10  --  20  CREATININE 0.71  --  0.91  CALCIUM 8.4*  --  9.1  MG  --  1.8  --    Liver Function Tests Recent Labs    07/15/22 1855  AST 21  ALT 23  ALKPHOS 60  BILITOT 0.5  PROT 6.5  ALBUMIN 3.9   Recent Labs    07/15/22 1855  LIPASE 29   High Sensitivity Troponin:   Recent Labs  Lab 07/15/22 1855 07/15/22 2045  TROPONINIHS <2 <2    BNP Invalid input(s): "POCBNP" D-Dimer No results for input(s): "DDIMER" in  the last 72 hours. Hemoglobin A1C No results for input(s): "HGBA1C" in the last 72 hours. Fasting Lipid Panel No results for input(s): "CHOL", "HDL", "LDLCALC", "TRIG", "CHOLHDL", "LDLDIRECT" in the last 72 hours. Thyroid Function Tests No results for input(s): "TSH", "T4TOTAL", "T3FREE", "THYROIDAB" in the last 72 hours.  Invalid input(s): "FREET3" _____________  ECHOCARDIOGRAM COMPLETE  Result Date: 07/16/2022    ECHOCARDIOGRAM REPORT   Patient Name:   Maria Ellis Stinson Date of Exam: 07/16/2022 Medical Rec #:  [id number]     Height:       63.0 in Accession #:    [id number]    Weight:       179.1 lb Date of Birth:  August 25, 1985     BSA:          1.845 m Patient Age:    37 years      BP:           107/61 mmHg Patient Gender: F             HR:           64 bpm. Exam Location:  Inpatient Procedure: 2D Echo, Cardiac Doppler, Color Doppler and Intracardiac            Opacification Agent Indications:    I25.110 Atherosclerotic heart disease of native coronary artery                 with unstable angina pectoris  History:        Patient has prior history of Echocardiogram examinations, most                 recent 03/03/2022. Previous Myocardial Infarction and CAD,                 Abnormal ECG, Arrythmias:Cardiac Arrest and Ventricular                 Fibrillation, Signs/Symptoms:Chest Pain and Syncope; Risk  Factors:Hypertension, Current Smoker and Dyslipidemia. VP shunt.  Sonographer:    Sheralyn Boatman RDCS Referring Phys: [id number] DENNIS NARCISSE JR  Sonographer Comments: Technically difficult study due to poor echo windows. Image acquisition challenging due to patient body habitus. IMPRESSIONS  1. Left ventricular ejection fraction, by estimation, is 40 to 45%. The left ventricle has mildly decreased function. The left ventricle demonstrates regional wall motion abnormalities (see scoring diagram/findings for description). Left ventricular diastolic parameters were normal.  2. Right ventricular systolic  function is normal. The right ventricular size is normal. Tricuspid regurgitation signal is inadequate for assessing PA pressure.  3. The mitral valve is grossly normal. Trivial mitral valve regurgitation. No evidence of mitral stenosis.  4. The aortic valve was not well visualized. Aortic valve regurgitation is not visualized. No aortic stenosis is present.  5. The inferior vena cava is normal in size with greater than 50% respiratory variability, suggesting right atrial pressure of 3 mmHg. Comparison(s): No significant change from prior study. Conclusion(s)/Recommendation(s): Findings consistent with ischemic cardiomyopathy. FINDINGS  Left Ventricle: Left ventricular ejection fraction, by estimation, is 40 to 45%. The left ventricle has mildly decreased function. The left ventricle demonstrates regional wall motion abnormalities. Definity contrast agent was given IV to delineate the left ventricular endocardial borders. The left ventricular internal cavity size was normal in size. There is no left ventricular hypertrophy. Left ventricular diastolic parameters were normal.  LV Wall Scoring: The mid inferoseptal segment, apical septal segment, apical inferior segment, and apex are hypokinetic. Right Ventricle: The right ventricular size is normal. No increase in right ventricular wall thickness. Right ventricular systolic function is normal. Tricuspid regurgitation signal is inadequate for assessing PA pressure. Left Atrium: Left atrial size was normal in size. Right Atrium: Right atrial size was normal in size. Pericardium: There is no evidence of pericardial effusion. Mitral Valve: The mitral valve is grossly normal. Trivial mitral valve regurgitation. No evidence of mitral valve stenosis. Tricuspid Valve: The tricuspid valve is grossly normal. Tricuspid valve regurgitation is trivial. No evidence of tricuspid stenosis. Aortic Valve: The aortic valve was not well visualized. Aortic valve regurgitation is not  visualized. No aortic stenosis is present. Pulmonic Valve: The pulmonic valve was grossly normal. Pulmonic valve regurgitation is not visualized. No evidence of pulmonic stenosis. Aorta: The aortic root and ascending aorta are structurally normal, with no evidence of dilitation. Venous: The inferior vena cava is normal in size with greater than 50% respiratory variability, suggesting right atrial pressure of 3 mmHg. IAS/Shunts: The atrial septum is grossly normal.  LEFT VENTRICLE PLAX 2D LVIDd:         5.40 cm      Diastology LVIDs:         4.30 cm      LV e' medial:    9.57 cm/s LV PW:         0.90 cm      LV E/e' medial:  10.9 LV IVS:        0.70 cm      LV e' lateral:   12.60 cm/s LVOT diam:     2.00 cm      LV E/e' lateral: 8.3 LV SV:         79 LV SV Index:   43 LVOT Area:     3.14 cm  LV Volumes (MOD) LV vol d, MOD A2C: 73.8 ml LV vol d, MOD A4C: 139.0 ml LV vol s, MOD A2C: 42.0 ml LV vol s, MOD A4C: 76.6 ml LV  SV MOD A2C:     31.8 ml LV SV MOD A4C:     139.0 ml LV SV MOD BP:      42.4 ml RIGHT VENTRICLE             IVC RV S prime:     15.60 cm/s  IVC diam: 1.80 cm TAPSE (M-mode): 3.1 cm LEFT ATRIUM             Index        RIGHT ATRIUM           Index LA diam:        2.50 cm 1.35 cm/m   RA Area:     11.90 cm LA Vol (A2C):   29.7 ml 16.10 ml/m  RA Volume:   25.10 ml  13.60 ml/m LA Vol (A4C):   37.0 ml 20.05 ml/m LA Biplane Vol: 34.2 ml 18.54 ml/m  AORTIC VALVE LVOT Vmax:   132.00 cm/s LVOT Vmean:  84.000 cm/s LVOT VTI:    0.253 m  AORTA Ao Root diam: 3.00 cm Ao Asc diam:  2.60 cm MITRAL VALVE MV Area (PHT): 4.79 cm     SHUNTS MV Decel Time: 159 msec     Systemic VTI:  0.25 m MV E velocity: 104.60 cm/s  Systemic Diam: 2.00 cm MV A velocity: 55.10 cm/s MV E/A ratio:  1.90 Lennie Odor MD Electronically signed by Lennie Odor MD Signature Date/Time: 07/16/2022/3:48:21 PM    Final    CARDIAC CATHETERIZATION  Result Date: 07/16/2022   Previously placed Mid LAD stent of unknown type is  widely patent.    There is mild left ventricular systolic dysfunction.   LV end diastolic pressure is moderately elevated.   The left ventricular ejection fraction is 35-45% by visual estimate. Patent mid LAD stent without restenosis No obstructive disease in the Circumflex or RCA LVEDP 24 mmHg Recommendations: medical management of CAD. Given elevated LV filling pressure, she may benefit from additional diuresis. Will give another dose of IV Lasix today. Echo pending.   DG Chest 2 View  Result Date: 07/15/2022 CLINICAL DATA:  chest pain EXAM: CHEST - 2 VIEW COMPARISON:  07/29/2021 no pneumothorax. FINDINGS: Lungs are clear. Heart size and mediastinal contours are within normal limits. No effusion. Visualized bones unremarkable. Presumed VP shunt tubing partially seen as before. IMPRESSION: No acute cardiopulmonary disease. Electronically Signed   By: Corlis Leak M.D.   On: 07/15/2022 18:42   Disposition   Pt is being discharged home today in good condition.  Follow-up Plans & Appointments     Follow-up Information     [facility] Emergency Department at [facility] .   Specialty: Emergency Medicine Why: Please return to emergency department medially for any worse concerning signs or symptoms Contact information: [address] [id number] mc [address] [place] [address] [phone]                  Discharge Medications   Allergies as of 07/17/2022       Reactions   Penicillins Anaphylaxis, Swelling      Iodinated Contrast Media Other (See Comments)   Body felt like it was on fire.   Multihance [gadobenate] Nausea Only, Cough   PT HAD SNEEZING AND NAUSEA IMMEDIATELY AFTER CONTRAST INJECTION.    Tylenol [acetaminophen] Rash, Other (See Comments)   Stomach cramping        Medication List     STOP taking these medications    ticagrelor 90  MG Tabs tablet Commonly known as: Brilinta       TAKE these medications    Aimovig 70 MG/ML Soaj Generic drug:  Erenumab-aooe Apply 70 mg topically every 30 (thirty) days.   albuterol 108 (90 Base) MCG/ACT inhaler Commonly known as: VENTOLIN HFA Inhale 2 puffs into the lungs every 4 (four) hours as needed for shortness of breath.   aspirin 81 MG chewable tablet Chew 1 tablet (81 mg total) by mouth daily.   citalopram 40 MG tablet Commonly known as: CELEXA Take 1 tablet (40 mg total) by mouth daily.   dapagliflozin propanediol 10 MG Tabs tablet Commonly known as: Farxiga Take 1 tablet (10 mg total) by mouth daily.   ezetimibe 10 MG tablet Commonly known as: ZETIA TAKE 1 TABLET BY MOUTH EVERY DAY   fluticasone 50 MCG/ACT nasal spray Commonly known as: FLONASE Place 1 spray into both nostrils in the morning and at bedtime.   furosemide 40 MG tablet Commonly known as: Lasix Take 1 tablet (40 mg total) by mouth daily as needed for fluid or edema (weight gain greater than 3 pounds in 24 hours).   Hydrocortisone Anti-Itch 1 % Generic drug: hydrocortisone cream Apply 1 Application topically 2 (two) times daily as needed for itching.   nitroGLYCERIN 0.4 MG SL tablet Commonly known as: NITROSTAT Place 1 tablet (0.4 mg total) under the tongue every 5 (five) minutes as needed for chest pain.   olopatadine 0.1 % ophthalmic solution Commonly known as: PATANOL Place 1 drop into both eyes 2 (two) times daily as needed for allergies.   omeprazole 20 MG capsule Commonly known as: PRILOSEC Take 1 capsule (20 mg total) by mouth daily.   ondansetron 4 MG disintegrating tablet Commonly known as: ZOFRAN-ODT Take 4 mg by mouth 2 (two) times daily as needed for nausea/vomiting.   potassium chloride SA 20 MEQ tablet Commonly known as: KLOR-CON M Take 1 tablet (20 mEq total) by mouth daily as needed (Only take on days when you also take Lasix (Furosemide)).   PRESCRIPTION MEDICATION Take 1 tablet by mouth as needed (migraine). Nurtec - Samples from doctor   rOPINIRole 1 MG tablet Commonly known  as: REQUIP Take 1 mg by mouth at bedtime.   rosuvastatin 40 MG tablet Commonly known as: CRESTOR TAKE 1 TABLET BY MOUTH EVERY DAY   spironolactone 25 MG tablet Commonly known as: ALDACTONE TAKE 0.5 TABLETS BY MOUTH AT BEDTIME. What changed: See the new instructions.   Suboxone 8-2 MG Film Generic drug: Buprenorphine HCl-Naloxone HCl Place 1 strip under the tongue daily as needed (withdrawal).   topiramate 50 MG tablet Commonly known as: TOPAMAX Take 1 tablet (50 mg total) by mouth daily.   Trelegy Ellipta 100-62.5-25 MCG/ACT Aepb Generic drug: Fluticasone-Umeclidin-Vilant Inhale 1 puff into the lungs daily.   Vitamin D3 1.25 MG (50000 UT) Caps Take 1 capsule by mouth every 30 (thirty) days.           Outstanding Labs/Studies   Repeat BMP on 7/3.  Duration of Discharge Encounter   Greater than 30 minutes including physician time.  Con Memos, PA-C 07/17/2022, 3:08 PM

## 2022-07-17 NOTE — Progress Notes (Signed)
Heart Failure Nurse Navigator Progress Note   Patient appointment with [facility] was cancelled for 07/28/2022 due to already having a scheduled [facility] follow up on 07/28/2022 and a follow up with Dr. Dulce Sellar on 8/14. Per patient she would just like to keep these appointments and not come to [facility] at this time. Appointment was cancelled.   Navigator will sign off at this time.   Rhae Hammock, BSN, Scientist, clinical (histocompatibility and immunogenetics) Only

## 2022-07-17 NOTE — Progress Notes (Signed)
   Heart Failure Stewardship Pharmacist Progress Note   PCP: Crist Fat, MD PCP-Cardiologist: None    HPI:  37 yo F with PMH of CHF, CAD s/p DES to LAD, VF arrest, and HLD.   On 03/20/21, she suffered out of hospital VF arrest at home. Immediate bystander CPR was initiated. On EMS arrival, she was in VF w/ achievement of ROSC w/ resuscitation efforts. EKG showed acute anterior STEMI. Transported to [facility]. Emergent LHC showed 100% occluded pLAD. All other vessels patent. Underwent PCI + DES. Echo showed moderately reduced LVEF, 35-40%. RV normal. discharge with losartan 12.5 mg daily, Farxiga, and metoprolol 12.5 mg daily.   GDMT limited by hypotension and dizziness. EF improved some to 40-45% in 05/2021. Last seen by [facility] on 02/19/22, BP 102/70. Repeat ECHO at that time with LVEF stable 40-45%.  Presented to the ED on 6/25 with chest pain and weight gain. CXR without cardiopulmonary disease. Taken for [facility] on 6/26 and found to have patent mid LAD stent without restenosis. No obstructive disease in Cx or RCA. LVEDP 24. Plan medical management. EF ~35-45% on LV gram. Formal ECHO 6/26 showed LVEF stable 40-45%, regional wall motion abnormalities, RV normal.   Current HF Medications: Diuretic: furosemide 40 mg IV BID MRA: spironolactone 12.5 mg qhs SGLT2i: Farxiga 10 mg daily  Prior to admission HF Medications: MRA: spironolactone 12.5 mg qhs SGLT2i: Farxiga 10 mg daily  Pertinent Lab Values: Serum creatinine 0.91, BUN 20, Potassium 2.9, Sodium 133, BNP 48, Magnesium 1.8  Vital Signs: Weight: 179 lbs (admission weight: 179 lbs) Blood pressure: 90-100/50s  Heart rate: 60-70s  I/O: -0.4L yesterday; net -0.7L  Medication Assistance / Insurance Benefits Check: Does the patient have prescription insurance?  Yes Type of insurance plan: [place] Medicaid  Outpatient Pharmacy:  Prior to admission [facility] Is the patient willing to use [facility] TOC pharmacy at  discharge? Yes Is the patient willing to transition their outpatient pharmacy to utilize a [facility] outpatient pharmacy?   No    Assessment: 1. Acute on chronic systolic CHF (LVEF 40-45%), due to ICM. NYHA class II symptoms. - On furosemide 40 mg IV BID. May be able to transition to PO soon. Strict I/Os and daily weights. Keep K>4 and Mg>2. KCl 40 mEq x 3 and magnesium 2g IV x 1 ordered for replacement - Continue spironolactone 12.5 mg at bedtime  - Continue Farxiga 10 mg daily - GDMT limited by hypotension and dizziness   Plan: 1) Medication changes recommended at this time: - Consider transitioning to PO lasix, likely needs at least PRN at discharge  2) Patient assistance: - None pending, has Medicaid  3)  Education  - Patient has been educated on current HF medications and potential additions to HF medication regimen - Patient verbalizes understanding that over the next few months, these medication doses may change and more medications may be added to optimize HF regimen - Patient has been educated on basic disease state pathophysiology and goals of therapy   Sharen Hones, PharmD, BCPS Heart Failure Stewardship Pharmacist Phone [phone]

## 2022-07-23 ENCOUNTER — Encounter: Payer: Self-pay | Admitting: Cardiology

## 2022-07-23 ENCOUNTER — Other Ambulatory Visit: Payer: Self-pay

## 2022-07-23 ENCOUNTER — Ambulatory Visit: Payer: 59 | Attending: Cardiology | Admitting: Cardiology

## 2022-07-23 VITALS — BP 112/80 | HR 66 | Ht 63.0 in | Wt 175.2 lb

## 2022-07-23 DIAGNOSIS — I25119 Atherosclerotic heart disease of native coronary artery with unspecified angina pectoris: Secondary | ICD-10-CM | POA: Diagnosis not present

## 2022-07-23 DIAGNOSIS — I209 Angina pectoris, unspecified: Secondary | ICD-10-CM

## 2022-07-23 DIAGNOSIS — I255 Ischemic cardiomyopathy: Secondary | ICD-10-CM

## 2022-07-23 DIAGNOSIS — E78019 Familial hypercholesterolemia, unspecified: Secondary | ICD-10-CM

## 2022-07-23 DIAGNOSIS — Z9861 Coronary angioplasty status: Secondary | ICD-10-CM | POA: Diagnosis not present

## 2022-07-23 DIAGNOSIS — I5042 Chronic combined systolic (congestive) and diastolic (congestive) heart failure: Secondary | ICD-10-CM | POA: Diagnosis not present

## 2022-07-23 DIAGNOSIS — E7801 Familial hypercholesterolemia: Secondary | ICD-10-CM

## 2022-07-23 DIAGNOSIS — I2510 Atherosclerotic heart disease of native coronary artery without angina pectoris: Secondary | ICD-10-CM

## 2022-07-23 MED ORDER — ROSUVASTATIN CALCIUM 40 MG PO TABS: 40.0000 mg | ORAL_TABLET | Freq: Every day | ORAL | 1 refills | Status: DC

## 2022-07-23 MED ORDER — POTASSIUM CHLORIDE CRYS ER 20 MEQ PO TBCR: 20.0000 meq | EXTENDED_RELEASE_TABLET | ORAL | 3 refills | Status: DC

## 2022-07-23 MED ORDER — FUROSEMIDE 40 MG PO TABS: 40.0000 mg | ORAL_TABLET | ORAL | 3 refills | Status: DC

## 2022-07-23 NOTE — Patient Instructions (Signed)
Medication Instructions:  Your physician has recommended you make the following change in your medication:   START: Lasix 40 mg every other day START: Potassium 20 meq every other day  *If you need a refill on your cardiac medications before your next appointment, please call your pharmacy*   Lab Work: Your physician recommends that you return for lab work in:   Labs in 2 weeks: BMP, Magnesium, TSH, Lipids  If you have labs (blood work) drawn today and your tests are completely normal, you will receive your results only by: MyChart Message (if you have MyChart) OR A paper copy in the mail If you have any lab test that is abnormal or we need to change your treatment, we will call you to review the results.   Testing/Procedures: None   Follow-Up: At [facility], you and your health needs are our priority.  As part of our continuing mission to provide you with exceptional heart care, we have created designated Provider Care Teams.  These Care Teams include your primary Cardiologist (physician) and Advanced Practice Providers (APPs -  Physician Assistants and Nurse Practitioners) who all work together to provide you with the care you need, when you need it.  We recommend signing up for the patient portal called "MyChart".  Sign up information is provided on this After Visit Summary.  MyChart is used to connect with patients for Virtual Visits (Telemedicine).  Patients are able to view lab/test results, encounter notes, upcoming appointments, etc.  Non-urgent messages can be sent to your provider as well.   To learn more about what you can do with MyChart, go to [email or web address].    Your next appointment:   3 month(s)  Provider:   Norman Herrlich, MD    Other Instructions Refer to cardiac rehab at [facility] regarding angina

## 2022-07-23 NOTE — Progress Notes (Unsigned)
Cardiology Office Note:    Date:  07/23/2022   ID:  Maria Ellis, DOB Feb 23, 1985, MRN [id number]  PCP:  Crist Fat, MD  Cardiologist:  Norman Herrlich, MD    Referring MD: Leonia Reader, Barbara Cower, MD    ASSESSMENT:    1. CAD S/P percutaneous coronary angioplasty   2. Ischemic cardiomyopathy   3. Chronic combined systolic and diastolic heart failure (HCC)   4. Familial hypercholesterolemia   5. Angina pectoris (HCC)    PLAN:    In order of problems listed above:  Presented to the hospital with chest pain troponin normal did not have ACS underwent left heart catheterization and fortunately her stent site is patent and ejection fraction is  Despite not having obstructive CAD she continues to have intermittent angina and would benefit from cardiac rehabilitation She is not on loop diuretic will resume every other day monitor weights at home 2 weeks check labs including proBNP renal function magnesium Recheck for lipid profile and continue her current lipid-lowering therapy with combination high intensity statin and Zetia Continue her guideline directed treatment maximally tolerated for cardiomyopathy including SGLT2 inhibitor spironolactone Maria Ellis intolerant of Entresto with hypotension    Next appointment: 3 months   Medication Adjustments/Labs and Tests Ordered: Current medicines are reviewed at length with the patient today.  Concerns regarding medicines are outlined above.  No orders of the defined types were placed in this encounter.  No orders of the defined types were placed in this encounter.    History of Present Illness:    Maria Ellis is a 37 y.o. female with a hx of CAD ischemic cardiomyopathy systolic heart failure familial hyperlipidemia previous presentation with out-of-hospital cardiac arrest VF and subsequent PCI and stent LAD 03/24/2021 EF initially severely reduced and then improved with guideline directed therapy and did not require ICD therapy treatment she was  last seen 02/19/2022.  He presented to [facility] chest pain underwent coronary angiography that showed patent stent in LAD and no new obstructive coronary stenosis.  She was felt to be mildly volume overloaded EF echo 40 to 45% and improved with IV Lasix.  Her recent proBNP level remains quite low at 48 and her chest x-ray did not show findings of heart failure.  Compliance with diet, lifestyle and medications: Yes   Her mother is present participates in evaluation and decision making She does have edema and she been taking a diuretic sporadically and with her cardiomyopathy I will ask her to start taking her furosemide and potassium every other day and she will monitor her weights at home I expect she will have a 5 pound weight loss return in 2 weeks and we will check renal function magnesium and lipid profile along with TSH She is struggling in the hot humid weather is not as active continues to have intermittent anginal discomfort and I think cardiac rehab would be a good option and I will refer her. Pattern is not unstable so far has not needed nitroglycerin At times she finds herself subtly short of breath no orthopnea PND   Past Medical History:  Diagnosis Date   Bell's palsy    Headache    High cholesterol    History of Chiari malformation    Hydrocephalus (HCC)    Myocardial infarct (HCC)     Current Medications: Current Meds  Medication Sig   AIMOVIG 70 MG/ML SOAJ Apply 70 mg topically every 30 (thirty) days.   albuterol (VENTOLIN HFA) 108 (90 Base)  MCG/ACT inhaler Inhale 2 puffs into the lungs every 4 (four) hours as needed for shortness of breath.   aspirin 81 MG chewable tablet Chew 1 tablet (81 mg total) by mouth daily.   Cholecalciferol (VITAMIN D3) 1.25 MG (50000 UT) CAPS Take 1 capsule by mouth every 30 (thirty) days.   citalopram (CELEXA) 40 MG tablet Take 1 tablet (40 mg total) by mouth daily.   dapagliflozin propanediol (FARXIGA) 10 MG TABS tablet Take 1  tablet (10 mg total) by mouth daily.   ezetimibe (ZETIA) 10 MG tablet TAKE 1 TABLET BY MOUTH EVERY DAY   fluticasone (FLONASE) 50 MCG/ACT nasal spray Place 1 spray into both nostrils in the morning and at bedtime.   furosemide (LASIX) 40 MG tablet Take 1 tablet (40 mg total) by mouth daily as needed for fluid or edema (weight gain greater than 3 pounds in 24 hours).   hydrocortisone cream (HYDROCORTISONE ANTI-ITCH) 1 % Apply 1 Application topically 2 (two) times daily as needed for itching.   nitroGLYCERIN (NITROSTAT) 0.4 MG SL tablet Place 1 tablet (0.4 mg total) under the tongue every 5 (five) minutes as needed for chest pain.   olopatadine (PATANOL) 0.1 % ophthalmic solution Place 1 drop into both eyes 2 (two) times daily as needed for allergies.   omeprazole (PRILOSEC) 20 MG capsule Take 1 capsule (20 mg total) by mouth daily.   ondansetron (ZOFRAN-ODT) 4 MG disintegrating tablet Take 4 mg by mouth 2 (two) times daily as needed for nausea/vomiting.   potassium chloride SA (KLOR-CON M) 20 MEQ tablet Take 1 tablet (20 mEq total) by mouth daily as needed (Only take on days when you also take Lasix (Furosemide)).   PRESCRIPTION MEDICATION Take 1 tablet by mouth as needed (migraine). Nurtec - Samples from doctor   rOPINIRole (REQUIP) 1 MG tablet Take 1 mg by mouth at bedtime.   rosuvastatin (CRESTOR) 40 MG tablet Take 1 tablet (40 mg total) by mouth daily.   spironolactone (ALDACTONE) 25 MG tablet TAKE 0.5 TABLETS BY MOUTH AT BEDTIME.   SUBOXONE 8-2 MG FILM Place 1 strip under the tongue daily as needed (withdrawal).   topiramate (TOPAMAX) 50 MG tablet Take 1 tablet (50 mg total) by mouth daily.   TRELEGY ELLIPTA 100-62.5-25 MCG/ACT AEPB Inhale 1 puff into the lungs daily.      EKGs/Labs/Other Studies Reviewed:    The following studies were reviewed today:  Cardiac Studies & Procedures   CARDIAC CATHETERIZATION  CARDIAC CATHETERIZATION 07/16/2022  Narrative   Previously placed Mid LAD  stent of unknown type is  widely patent.   There is mild left ventricular systolic dysfunction.   LV end diastolic pressure is moderately elevated.   The left ventricular ejection fraction is 35-45% by visual estimate.  Patent mid LAD stent without restenosis No obstructive disease in the Circumflex or RCA LVEDP 24 mmHg  Recommendations: medical management of CAD. Given elevated LV filling pressure, she may benefit from additional diuresis. Will give another dose of IV Lasix today. Echo pending.  Findings Coronary Findings Diagnostic  Dominance: Right  Left Anterior Descending Previously placed Mid LAD stent of unknown type is  widely patent.  Intervention  No interventions have been documented.   CARDIAC CATHETERIZATION  CARDIAC CATHETERIZATION 03/20/2021  Narrative   CULPRIT LESION: Prox LAD to Mid LAD lesion is 100% stenosed.   A drug-eluting stent was successfully placed using a SYNERGY XD 2.50X28.  Postdilated to 3.1 mm.   Post intervention, there is a 0% residual  stenosis.   --------------------------------------   Otherwise angiographically normal RCA, RI and LCx   --------------------------------------   There is moderate left ventricular systolic dysfunction.  The left ventricular ejection fraction is 35-45% by visual estimate.   LV end diastolic pressure is severely elevated.   There is no aortic valve stenosis.  SUMMARY Cardiac arrest with ventricular fibrillation and ROSC secondary to Anterior STEMI Severe single-vessel CAD with 100% thrombotic occlusion of the LAD just after D1 Successful DES PCI of LAD restoring TIMI-3 flow using Synergy XD DES 2.5 x 28 mm stent postdilated to 3.1 mm. Acute combined systolic and diastolic heart failure: EF estimated roughly 40% by LV gram with EDP of 28 to 30 mmHg.   RECOMMENDATIONS We will continue lidocaine and Aggrastat drip for 2 to 3 hours post PCI to allow for further stabilization Check 2D echocardiogram on March 2 to  allow time for stabilization Given 4 mg IV Lasix post-cath-reassess Start low-dose beta-blocker-carvedilol 3.125 mg twice daily.  We will need to titrate as blood pressure tolerates and consider addition of ARB/ARNI and spironolactone. Increase statin dose to 80 mg daily Smoking station counseling    Bryan Lemma, MD  Findings Coronary Findings Diagnostic  Dominance: Right  Left Anterior Descending Prox LAD to Mid LAD lesion is 100% stenosed. Vessel is the culprit lesion. The lesion is type A, located at the major branch, segmental, thrombotic and ulcerative.  First Diagonal Branch Vessel is small in size.  Second Diagonal Branch Vessel is small in size.  Second Septal Branch Vessel is small in size.  Third Diagonal Branch Vessel is small in size.  Ramus Intermedius Vessel is large.  Left Circumflex Vessel is moderate in size.  First Obtuse Marginal Branch Vessel is small in size.  Right Coronary Artery Vessel is large.  Acute Marginal Branch Vessel is small in size.  Right Ventricular Branch Vessel is small in size.  Right Posterior Descending Artery Vessel is moderate in size.  First Right Posterolateral Branch Vessel is small in size.  Second Right Posterolateral Branch Vessel is moderate in size.  Third Right Posterolateral Branch Vessel is small in size.  Intervention  Prox LAD to Mid LAD lesion Stent Lesion length:  26 mm. CATH VISTA GUIDE 6FR XBLAD3.5 guide catheter was inserted. Lesion crossed with guidewire using a WIRE ASAHI PROWATER 180CM. Pre-stent angioplasty was performed using a BALLN SAPPHIRE 2.0X12. Maximum pressure:  12 atm. Inflation time:  20 sec. A drug-eluting stent was successfully placed using a SYNERGY XD 2.50X28. Maximum pressure: 16 atm. Inflation time: 30 sec. Stent strut is well apposed. Postdilated to 3.1 mm Post-stent angioplasty was performed using a BALLN SAPPHIRE [place] 3.0X18. Maximum pressure:  18 atm. Inflation time:  20  sec. Post-Intervention Lesion Assessment The intervention was successful. Pre-interventional TIMI flow is 0. Post-intervention TIMI flow is 3. Treated lesion length:  28 mm. No complications occurred at this lesion. There is a 0% residual stenosis post intervention.     ECHOCARDIOGRAM  ECHOCARDIOGRAM COMPLETE 07/16/2022  Narrative ECHOCARDIOGRAM REPORT    Patient Name:   Maria Ellis Date of Exam: 07/16/2022 Medical Rec #:  [id number]     Height:       63.0 in Accession #:    [id number]    Weight:       179.1 lb Date of Birth:  Jun 19, 1985     BSA:          1.845 m Patient Age:    37 years  BP:           107/61 mmHg Patient Gender: F             HR:           64 bpm. Exam Location:  Inpatient  Procedure: 2D Echo, Cardiac Doppler, Color Doppler and Intracardiac Opacification Agent  Indications:    I25.110 Atherosclerotic heart disease of native coronary artery with unstable angina pectoris  History:        Patient has prior history of Echocardiogram examinations, most recent 03/03/2022. Previous Myocardial Infarction and CAD, Abnormal ECG, Arrythmias:Cardiac Arrest and Ventricular Fibrillation, Signs/Symptoms:Chest Pain and Syncope; Risk Factors:Hypertension, Current Smoker and Dyslipidemia. VP shunt.  Sonographer:    Sheralyn Boatman RDCS Referring Phys: [id number] DENNIS NARCISSE JR   Sonographer Comments: Technically difficult study due to poor echo windows. Image acquisition challenging due to patient body habitus. IMPRESSIONS   1. Left ventricular ejection fraction, by estimation, is 40 to 45%. The left ventricle has mildly decreased function. The left ventricle demonstrates regional wall motion abnormalities (see scoring diagram/findings for description). Left ventricular diastolic parameters were normal. 2. Right ventricular systolic function is normal. The right ventricular size is normal. Tricuspid regurgitation signal is inadequate for assessing PA pressure. 3. The mitral  valve is grossly normal. Trivial mitral valve regurgitation. No evidence of mitral stenosis. 4. The aortic valve was not well visualized. Aortic valve regurgitation is not visualized. No aortic stenosis is present. 5. The inferior vena cava is normal in size with greater than 50% respiratory variability, suggesting right atrial pressure of 3 mmHg.  Comparison(s): No significant change from prior study.  Conclusion(s)/Recommendation(s): Findings consistent with ischemic cardiomyopathy.  FINDINGS Left Ventricle: Left ventricular ejection fraction, by estimation, is 40 to 45%. The left ventricle has mildly decreased function. The left ventricle demonstrates regional wall motion abnormalities. Definity contrast agent was given IV to delineate the left ventricular endocardial borders. The left ventricular internal cavity size was normal in size. There is no left ventricular hypertrophy. Left ventricular diastolic parameters were normal.   LV Wall Scoring: The mid inferoseptal segment, apical septal segment, apical inferior segment, and apex are hypokinetic.  Right Ventricle: The right ventricular size is normal. No increase in right ventricular wall thickness. Right ventricular systolic function is normal. Tricuspid regurgitation signal is inadequate for assessing PA pressure.  Left Atrium: Left atrial size was normal in size.  Right Atrium: Right atrial size was normal in size.  Pericardium: There is no evidence of pericardial effusion.  Mitral Valve: The mitral valve is grossly normal. Trivial mitral valve regurgitation. No evidence of mitral valve stenosis.  Tricuspid Valve: The tricuspid valve is grossly normal. Tricuspid valve regurgitation is trivial. No evidence of tricuspid stenosis.  Aortic Valve: The aortic valve was not well visualized. Aortic valve regurgitation is not visualized. No aortic stenosis is present.  Pulmonic Valve: The pulmonic valve was grossly normal. Pulmonic  valve regurgitation is not visualized. No evidence of pulmonic stenosis.  Aorta: The aortic root and ascending aorta are structurally normal, with no evidence of dilitation.  Venous: The inferior vena cava is normal in size with greater than 50% respiratory variability, suggesting right atrial pressure of 3 mmHg.  IAS/Shunts: The atrial septum is grossly normal.   LEFT VENTRICLE PLAX 2D LVIDd:         5.40 cm      Diastology LVIDs:         4.30 cm      LV e' medial:  9.57 cm/s LV PW:         0.90 cm      LV E/e' medial:  10.9 LV IVS:        0.70 cm      LV e' lateral:   12.60 cm/s LVOT diam:     2.00 cm      LV E/e' lateral: 8.3 LV SV:         79 LV SV Index:   43 LVOT Area:     3.14 cm  LV Volumes (MOD) LV vol d, MOD A2C: 73.8 ml LV vol d, MOD A4C: 139.0 ml LV vol s, MOD A2C: 42.0 ml LV vol s, MOD A4C: 76.6 ml LV SV MOD A2C:     31.8 ml LV SV MOD A4C:     139.0 ml LV SV MOD BP:      42.4 ml  RIGHT VENTRICLE             IVC RV S prime:     15.60 cm/s  IVC diam: 1.80 cm TAPSE (M-mode): 3.1 cm  LEFT ATRIUM             Index        RIGHT ATRIUM           Index LA diam:        2.50 cm 1.35 cm/m   RA Area:     11.90 cm LA Vol (A2C):   29.7 ml 16.10 ml/m  RA Volume:   25.10 ml  13.60 ml/m LA Vol (A4C):   37.0 ml 20.05 ml/m LA Biplane Vol: 34.2 ml 18.54 ml/m AORTIC VALVE LVOT Vmax:   132.00 cm/s LVOT Vmean:  84.000 cm/s LVOT VTI:    0.253 m  AORTA Ao Root diam: 3.00 cm Ao Asc diam:  2.60 cm  MITRAL VALVE MV Area (PHT): 4.79 cm     SHUNTS MV Decel Time: 159 msec     Systemic VTI:  0.25 m MV E velocity: 104.60 cm/s  Systemic Diam: 2.00 cm MV A velocity: 55.10 cm/s MV E/A ratio:  1.90  Lennie Odor MD Electronically signed by Lennie Odor MD Signature Date/Time: 07/16/2022/3:48:21 PM    Final    MONITORS  LONG TERM MONITOR-LIVE TELEMETRY (3-14 DAYS) 06/04/2021  Narrative Patch Wear Time:  13 days and 14 hours (2023-04-19T15:56:23-0400 to  2023-05-03T06:38:18-0400)  Patient had a min HR of 43 bpm, max HR of 120 bpm, and avg HR of 63 bpm. Predominant underlying rhythm was Sinus Rhythm.  Isolated SVEs were rare (<1.0%), SVE Couplets were rare (<1.0%), and no SVE Triplets were present.  Isolated VEs were rare (<1.0%), VE Couplets were rare (<1.0%), and no VE Triplets were present. Ventricular Bigeminy and Trigeminy were present.  There were 2 triggered and 4 diary events, all SRTH 1 with a single PVC.               Recent Labs: 02/19/2022: NT-Pro BNP 121 07/15/2022: ALT 23 07/16/2022: B Natriuretic Peptide 48.0 07/17/2022: BUN 20; Creatinine, Ser 0.91; Hemoglobin 14.0; Magnesium 1.8; Platelets 234; Potassium 2.9; Sodium 133  Recent Lipid Panel    Component Value Date/Time   CHOL 102 02/19/2022 1350   TRIG 91 02/19/2022 1350   HDL 54 02/19/2022 1350   CHOLHDL 1.9 02/19/2022 1350   CHOLHDL 4.4 03/20/2021 0526   VLDL 7 03/20/2021 0526   LDLCALC 30 02/19/2022 1350    Physical Exam:    VS:  BP 112/80 (BP Location: Right Arm, Patient  Position: Sitting, Cuff Size: Normal)   Pulse 66   Ht 5\' 3"  (1.6 m)   Wt 175 lb 3.2 oz (79.5 kg)   SpO2 98%   BMI 31.04 kg/m     Wt Readings from Last 3 Encounters:  07/23/22 175 lb 3.2 oz (79.5 kg)  07/16/22 179 lb 1.6 oz (81.2 kg)  02/19/22 172 lb (78 kg)     GEN:  Well nourished, well developed in no acute distress HEENT: Normal NECK: No JVD; No carotid bruits LYMPHATICS: No lymphadenopathy CARDIAC: RRR, no murmurs, rubs, gallops RESPIRATORY:  Clear to auscultation without rales, wheezing or rhonchi  ABDOMEN: Soft, non-tender, non-distended MUSCULOSKELETAL: She does have 2+ bilateral lower extremity pitting ankle to knee edema; No deformity  SKIN: Warm and dry NEUROLOGIC:  Alert and oriented x 3 PSYCHIATRIC:  Normal affect    Signed, Norman Herrlich, MD  07/23/2022 4:37 PM    [facility]

## 2022-07-25 DIAGNOSIS — G4733 Obstructive sleep apnea (adult) (pediatric): Secondary | ICD-10-CM | POA: Diagnosis not present

## 2022-07-28 ENCOUNTER — Ambulatory Visit: Payer: 59 | Admitting: Cardiology

## 2022-07-28 ENCOUNTER — Ambulatory Visit (HOSPITAL_COMMUNITY): Payer: 59

## 2022-08-06 ENCOUNTER — Encounter: Payer: Self-pay | Admitting: Cardiology

## 2022-08-07 DIAGNOSIS — I2510 Atherosclerotic heart disease of native coronary artery without angina pectoris: Secondary | ICD-10-CM | POA: Diagnosis not present

## 2022-08-07 DIAGNOSIS — I5042 Chronic combined systolic (congestive) and diastolic (congestive) heart failure: Secondary | ICD-10-CM | POA: Diagnosis not present

## 2022-08-07 DIAGNOSIS — I255 Ischemic cardiomyopathy: Secondary | ICD-10-CM | POA: Diagnosis not present

## 2022-08-07 DIAGNOSIS — Z9861 Coronary angioplasty status: Secondary | ICD-10-CM | POA: Diagnosis not present

## 2022-08-08 LAB — BASIC METABOLIC PANEL
BUN/Creatinine Ratio: 13 (ref 9–23)
BUN: 9 mg/dL (ref 6–20)
CO2: 24 mmol/L (ref 20–29)
Calcium: 9.2 mg/dL (ref 8.7–10.2)
Chloride: 104 mmol/L (ref 96–106)
Creatinine, Ser: 0.71 mg/dL (ref 0.57–1.00)
Glucose: 86 mg/dL (ref 70–99)
Potassium: 3.6 mmol/L (ref 3.5–5.2)
Sodium: 140 mmol/L (ref 134–144)
eGFR: 112 mL/min/{1.73_m2} (ref >59)

## 2022-08-08 LAB — LIPID PANEL
Chol/HDL Ratio: 2 ratio (ref 0.0–4.4)
Cholesterol, Total: 103 mg/dL (ref 100–199)
HDL: 51 mg/dL (ref >39)
LDL Chol Calc (NIH): 32 mg/dL (ref 0–99)
Triglycerides: 106 mg/dL (ref 0–149)
VLDL Cholesterol Cal: 20 mg/dL (ref 5–40)

## 2022-08-08 LAB — MAGNESIUM: Magnesium: 1.9 mg/dL (ref 1.6–2.3)

## 2022-08-08 LAB — TSH: TSH: 1.16 u[IU]/mL (ref 0.450–4.500)

## 2022-08-12 DIAGNOSIS — Z79899 Other long term (current) drug therapy: Secondary | ICD-10-CM | POA: Diagnosis not present

## 2022-08-13 DIAGNOSIS — E785 Hyperlipidemia, unspecified: Secondary | ICD-10-CM | POA: Diagnosis not present

## 2022-08-13 DIAGNOSIS — I10 Essential (primary) hypertension: Secondary | ICD-10-CM | POA: Diagnosis not present

## 2022-08-13 DIAGNOSIS — Z955 Presence of coronary angioplasty implant and graft: Secondary | ICD-10-CM | POA: Diagnosis not present

## 2022-08-13 DIAGNOSIS — I25118 Atherosclerotic heart disease of native coronary artery with other forms of angina pectoris: Secondary | ICD-10-CM | POA: Diagnosis not present

## 2022-08-15 ENCOUNTER — Other Ambulatory Visit: Payer: Self-pay | Admitting: Physician Assistant

## 2022-08-15 DIAGNOSIS — Z955 Presence of coronary angioplasty implant and graft: Secondary | ICD-10-CM | POA: Diagnosis not present

## 2022-08-15 DIAGNOSIS — E785 Hyperlipidemia, unspecified: Secondary | ICD-10-CM | POA: Diagnosis not present

## 2022-08-15 DIAGNOSIS — I25118 Atherosclerotic heart disease of native coronary artery with other forms of angina pectoris: Secondary | ICD-10-CM | POA: Diagnosis not present

## 2022-08-15 DIAGNOSIS — I10 Essential (primary) hypertension: Secondary | ICD-10-CM | POA: Diagnosis not present

## 2022-08-18 DIAGNOSIS — J45909 Unspecified asthma, uncomplicated: Secondary | ICD-10-CM | POA: Diagnosis not present

## 2022-08-18 DIAGNOSIS — G4733 Obstructive sleep apnea (adult) (pediatric): Secondary | ICD-10-CM | POA: Diagnosis not present

## 2022-08-20 DIAGNOSIS — I25118 Atherosclerotic heart disease of native coronary artery with other forms of angina pectoris: Secondary | ICD-10-CM | POA: Diagnosis not present

## 2022-08-20 DIAGNOSIS — Z955 Presence of coronary angioplasty implant and graft: Secondary | ICD-10-CM | POA: Diagnosis not present

## 2022-08-20 DIAGNOSIS — I10 Essential (primary) hypertension: Secondary | ICD-10-CM | POA: Diagnosis not present

## 2022-08-20 DIAGNOSIS — E785 Hyperlipidemia, unspecified: Secondary | ICD-10-CM | POA: Diagnosis not present

## 2022-08-22 DIAGNOSIS — I2089 Other forms of angina pectoris: Secondary | ICD-10-CM | POA: Diagnosis not present

## 2022-08-23 IMAGING — DX DG CHEST 1V
2 series · 2 of 2 positions shown · non-contrast
Comparison: Chest x-ray 03/20/2021

CLINICAL DATA: Chest pain, contusion

EXAM:
CHEST  1 VIEW

[chest ap (1 of 2)]
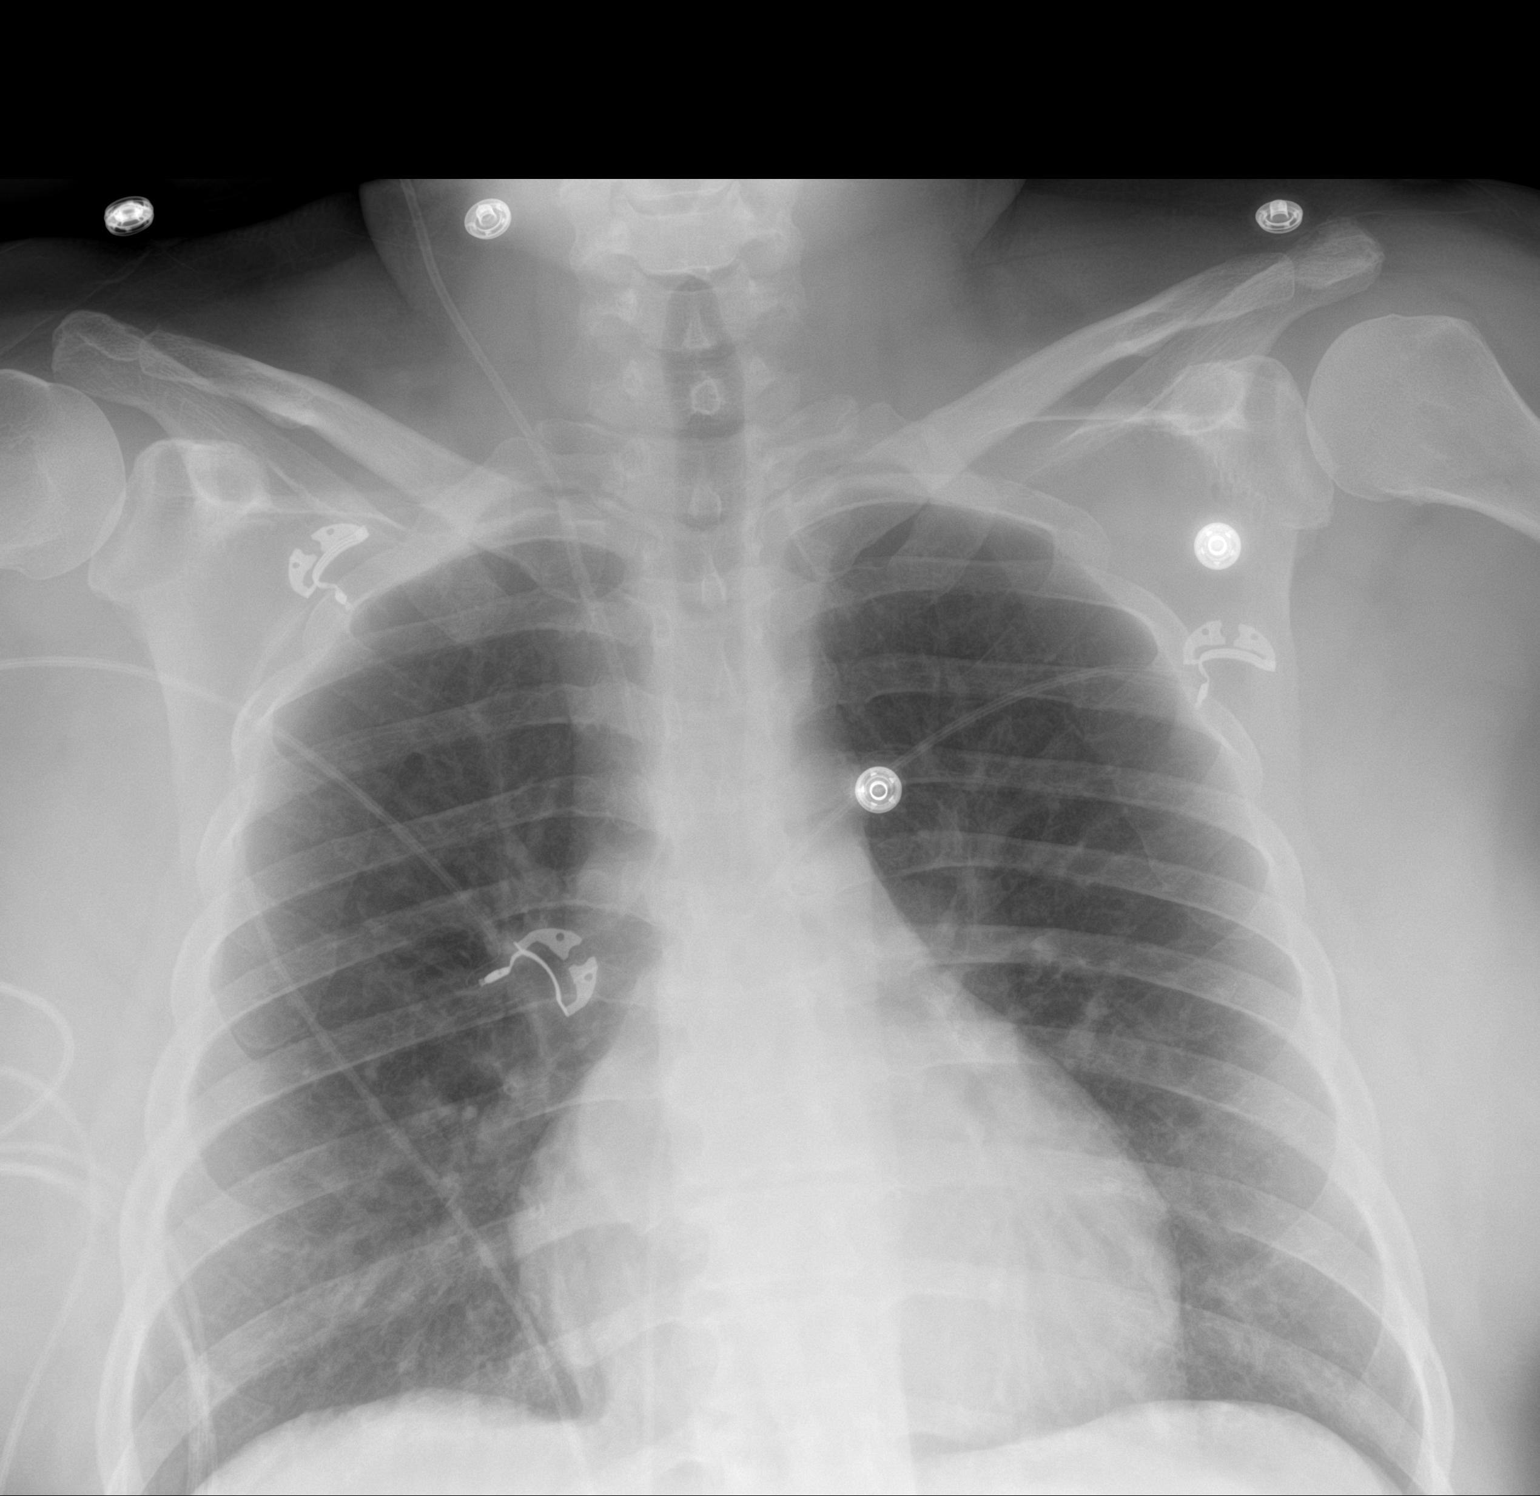

[chest ap (2 of 2)]
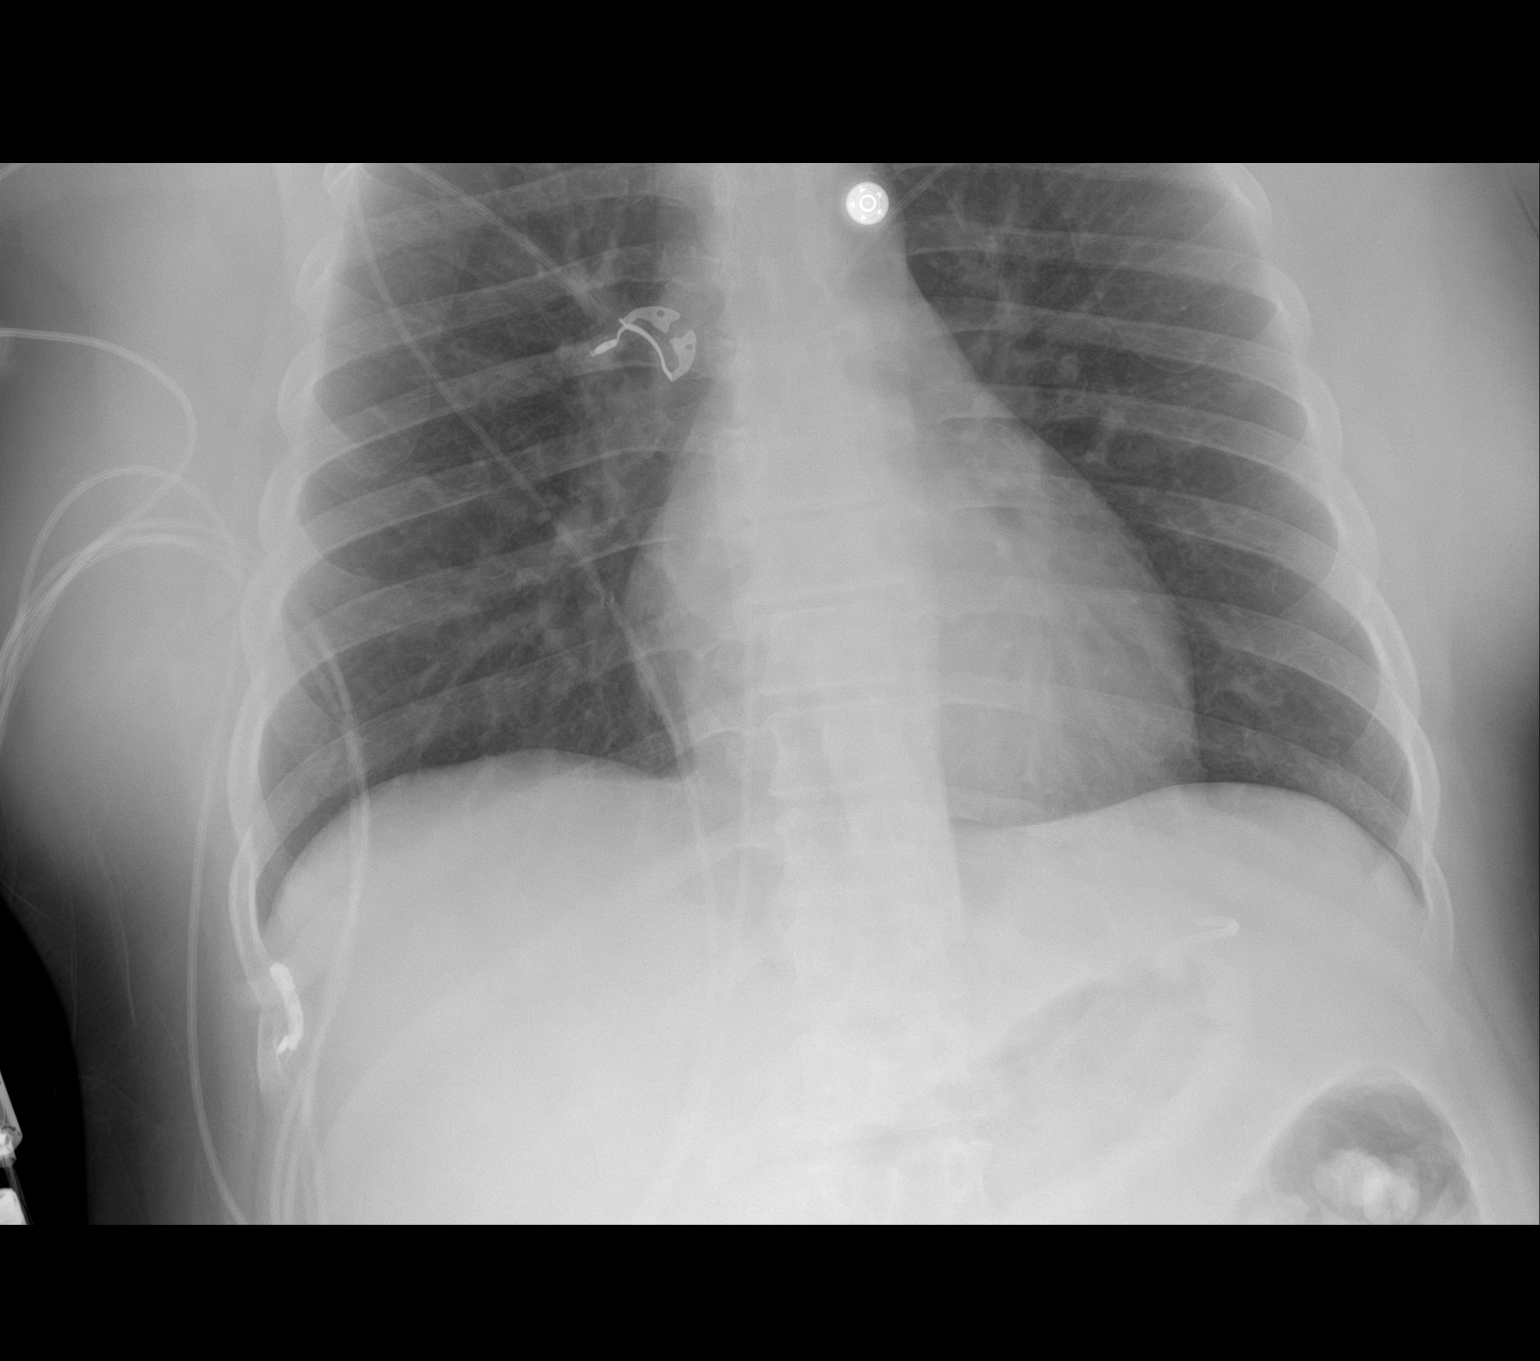

[2 of 2 positions shown; findings below may reference images not displayed]

FINDINGS: Heart size and mediastinal contours are within normal limits. No
suspicious pulmonary opacities identified.

No pleural effusion or pneumothorax visualized.

Right-sided ventriculoperitoneal shunt catheter. No acute osseous
abnormality appreciated.
IMPRESSION: No acute intrathoracic process identified.

## 2022-08-23 IMAGING — DX DG CHEST 2V
2 series · 2 of 2 positions shown · non-contrast
Comparison: Earlier today

CLINICAL DATA: Right rib pain after CPR.

EXAM:
CHEST - 2 VIEW

[w chest pa]
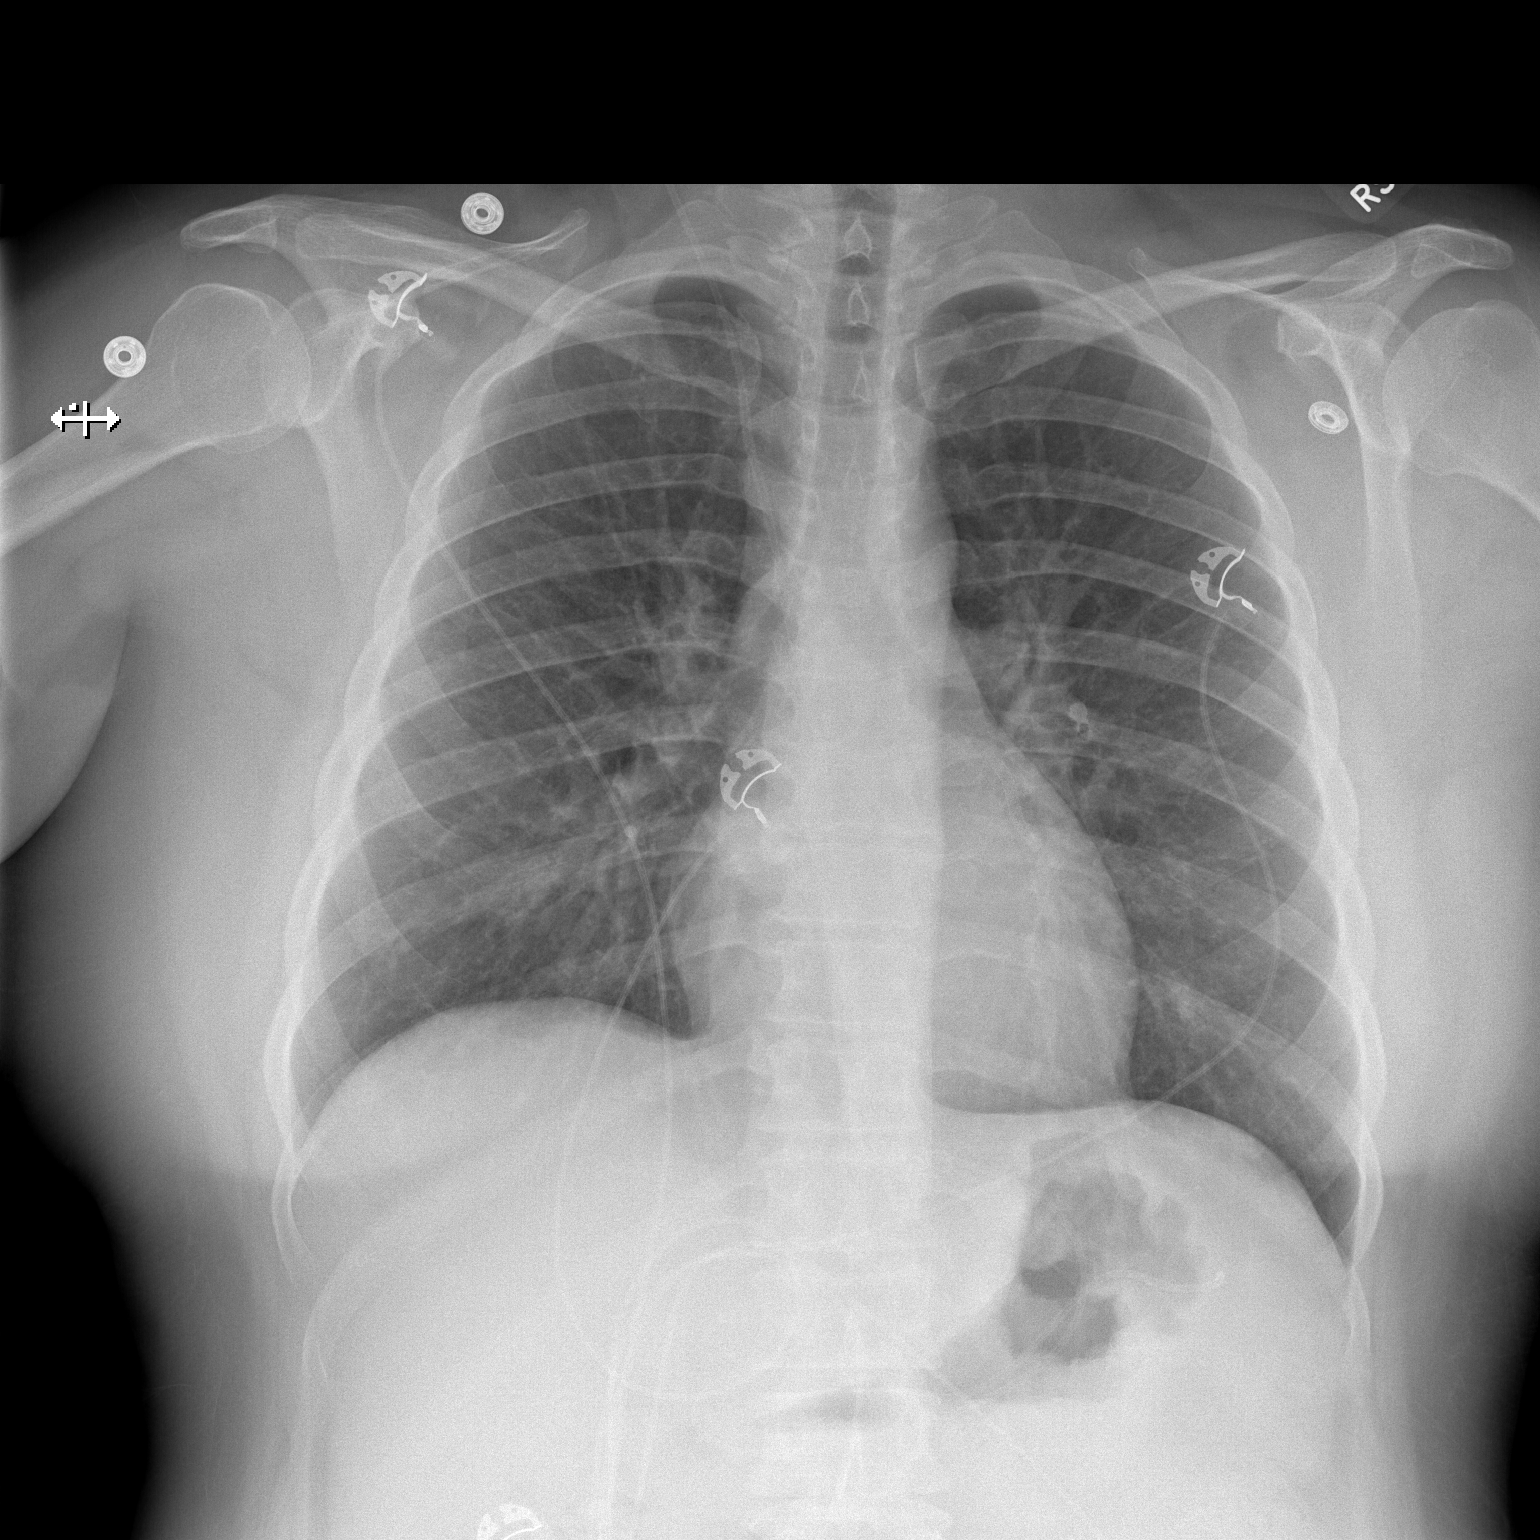

[w chest lat]
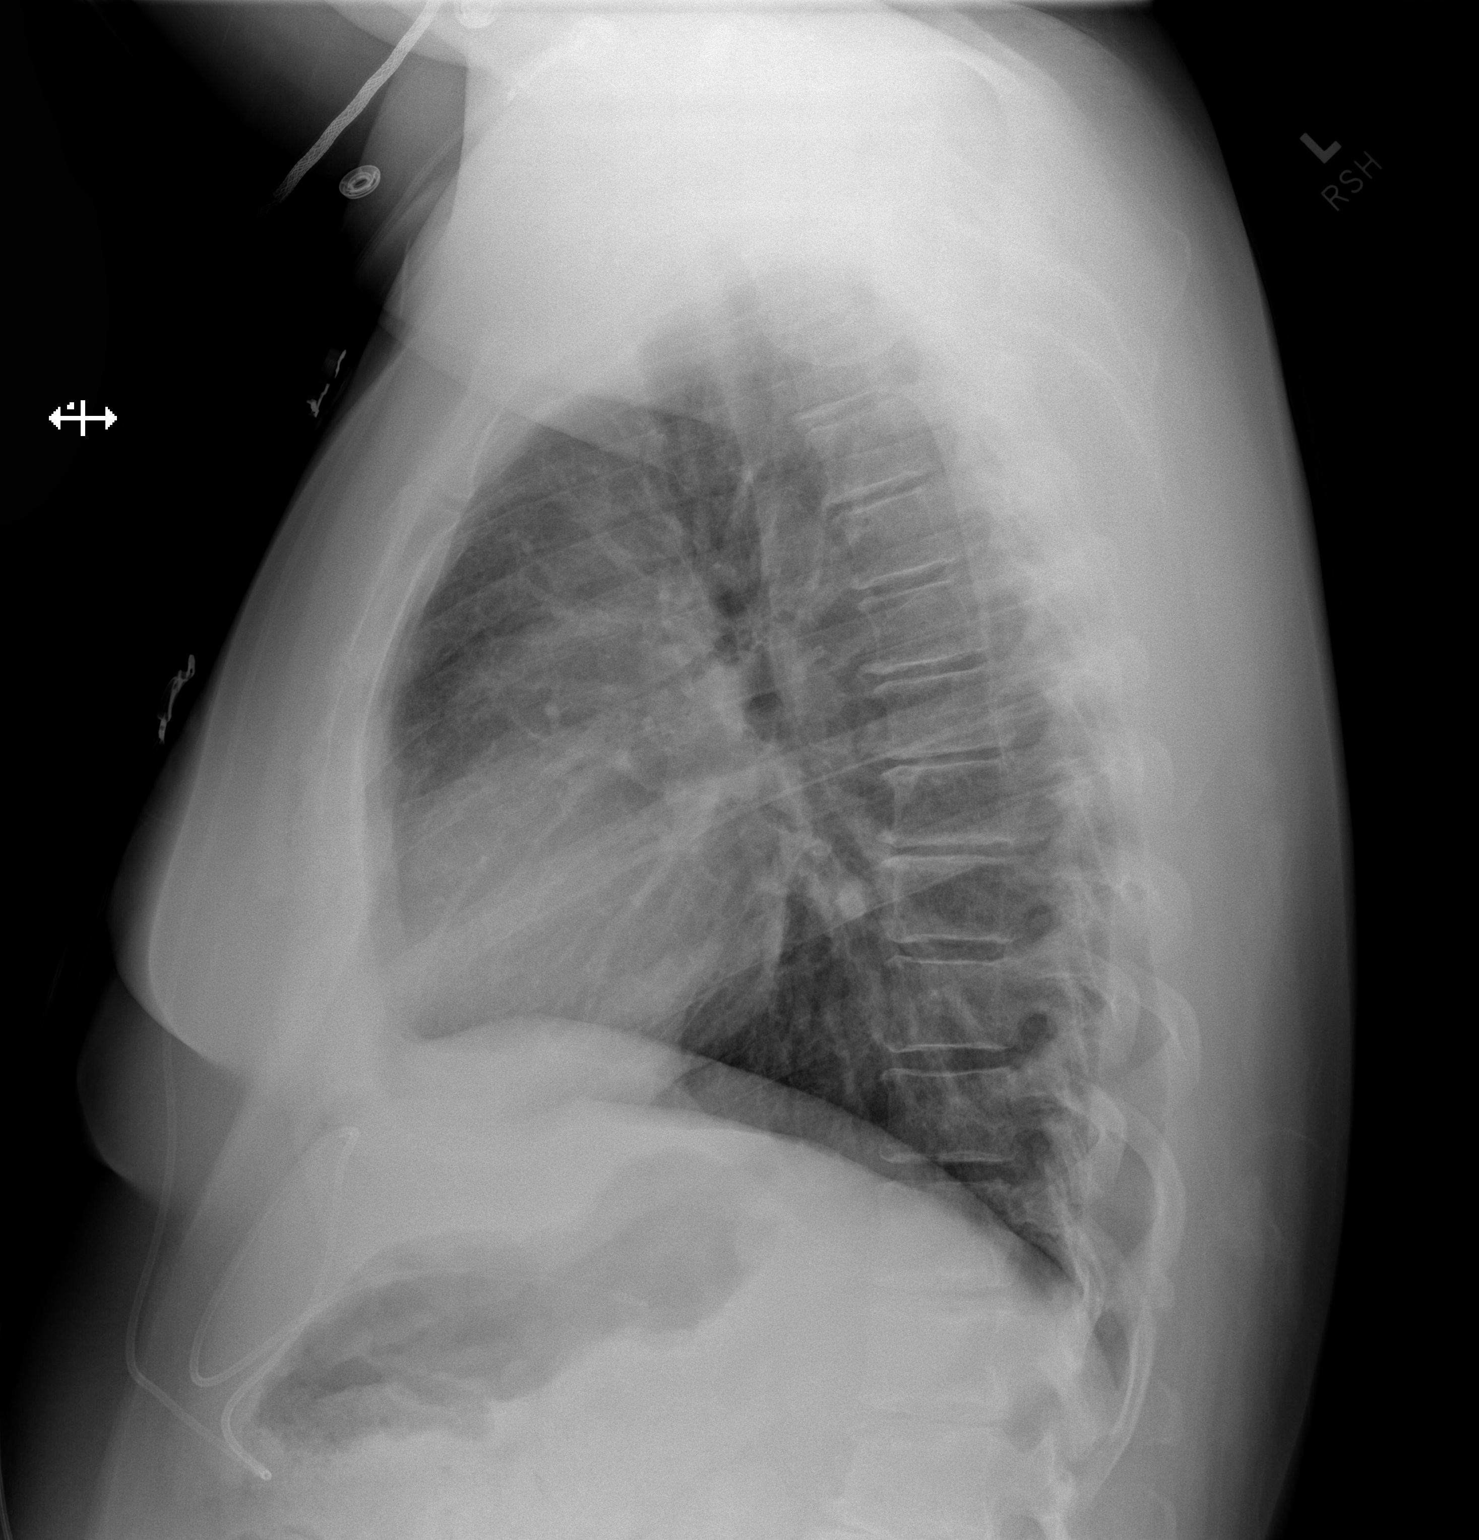

[2 of 2 positions shown; findings below may reference images not displayed]

FINDINGS: The heart size and mediastinal contours are within normal limits.
Both lungs are clear. Right sided ventriculoperitoneal shunt
catheter is again noted. There is a focal area of cortical
discontinuity along the mid body of sternum which may reflect
nondisplaced fracture status post CPR.
IMPRESSION: 1. No acute cardiopulmonary abnormalities.
2. Cortical discontinuity along the mid body of the sternum may
reflect nondisplaced fracture status post CPR. Correlate for any
focal tenderness along the sternum.

## 2022-08-25 DIAGNOSIS — I2089 Other forms of angina pectoris: Secondary | ICD-10-CM | POA: Diagnosis not present

## 2022-08-25 DIAGNOSIS — G4733 Obstructive sleep apnea (adult) (pediatric): Secondary | ICD-10-CM | POA: Diagnosis not present

## 2022-08-27 DIAGNOSIS — I2089 Other forms of angina pectoris: Secondary | ICD-10-CM | POA: Diagnosis not present

## 2022-08-28 ENCOUNTER — Other Ambulatory Visit: Payer: Self-pay | Admitting: Internal Medicine

## 2022-08-29 DIAGNOSIS — I2089 Other forms of angina pectoris: Secondary | ICD-10-CM | POA: Diagnosis not present

## 2022-09-01 DIAGNOSIS — I2089 Other forms of angina pectoris: Secondary | ICD-10-CM | POA: Diagnosis not present

## 2022-09-02 NOTE — Progress Notes (Unsigned)
Cardiology Office Note:    Date:  09/03/2022   ID:  Maria Ellis, DOB Feb 19, 1985, MRN [id number]  PCP:  Crist Fat, MD  Cardiologist:  Norman Herrlich, MD    Referring MD: Crist Fat, MD    ASSESSMENT:    1. Coronary artery disease of native artery of native heart with stable angina pectoris (HCC)   2. Ischemic cardiomyopathy   3. Chronic combined systolic and diastolic heart failure (HCC)   4. Familial hypercholesterolemia    PLAN:    In order of problems listed above:  It is good to see Yuneisy doing well again restarting cardiac rehab as her back in a good lifestyle and resolution of cardiovascular symptoms without angina or findings of heart failure.  Continue her current medical treatment with the aspirin lipid-lowering therapy with rosuvastatin and nitroglycerin if needed. Stable on good guideline directed therapy including SGLT2 inhibitor spironolactone and not on Entresto with previous symptomatic hypotension Well compensated continue her current loop diuretic Lipids at target continue high intensity statin Zetia   Next appointment: 3 months   Medication Adjustments/Labs and Tests Ordered: Current medicines are reviewed at length with the patient today.  Concerns regarding medicines are outlined above.  No orders of the defined types were placed in this encounter.  No orders of the defined types were placed in this encounter.    History of Present Illness:    Maria Ellis is a 37 y.o. female with a hx of coronary artery disease ischemic cardiomyopathy with systolic heart failure familial hyperlipidemia previous presentation out-of-hospital cardiac arrest VF and subsequent PCI and stent LAD March 2023 with initial severe reduction in ejection fraction that improved with guideline directed therapy and did not require an ICD prophylactically last seen 07/23/2022. She presented to [facility] chest pain underwent coronary angiography that showed patent stent  in LAD and no new obstructive coronary stenosis. She was felt to be mildly volume overloaded EF echo 40 to 45% and improved with IV Lasix. Her recent proBNP level remains quite low at 48 and her chest x-ray did not show findings of heart failure.  With ongoing angina she was referred to cardiac rehabilitation.  Compliance with diet, lifestyle and medications: Yes  Cardiac rehab has been therapeutic she feels much better confident her weight is down 7 pounds no edema shortness of breath chest pain palpitation or syncope She is a smart watch she has had no alerts for heart rate or atrial fibrillation Recent labs are exceptional LDL cholesterol 32 total 103 A1c 5.5 hemoglobin 14.0 creatinine 0.71 potassium 3.6 Past Medical History:  Diagnosis Date   Bell's palsy    Headache    High cholesterol    History of Chiari malformation    Hydrocephalus (HCC)    Myocardial infarct (HCC)     Current Medications: Current Meds  Medication Sig   AIMOVIG 70 MG/ML SOAJ Apply 70 mg topically every 30 (thirty) days.   albuterol (VENTOLIN HFA) 108 (90 Base) MCG/ACT inhaler Inhale 2 puffs into the lungs every 4 (four) hours as needed for shortness of breath.   aspirin 81 MG chewable tablet Chew 1 tablet (81 mg total) by mouth daily.   Cholecalciferol (VITAMIN D3) 1.25 MG (50000 UT) CAPS Take 1 capsule by mouth every 30 (thirty) days.   citalopram (CELEXA) 40 MG tablet TAKE 1 TABLET BY MOUTH EVERY DAY   dapagliflozin propanediol (FARXIGA) 10 MG TABS tablet Take 1 tablet (10 mg total) by mouth daily.  ezetimibe (ZETIA) 10 MG tablet TAKE 1 TABLET BY MOUTH EVERY DAY   fluticasone (FLONASE) 50 MCG/ACT nasal spray Place 1 spray into both nostrils in the morning and at bedtime.   furosemide (LASIX) 40 MG tablet Take 1 tablet (40 mg total) by mouth every other day.   hydrocortisone cream (HYDROCORTISONE ANTI-ITCH) 1 % Apply 1 Application topically 2 (two) times daily as needed for itching.   nitroGLYCERIN  (NITROSTAT) 0.4 MG SL tablet PLACE 1 TABLET UNDER THE TONGUE EVERY 5 MINUTES AS NEEDED FOR CHEST PAIN.   olopatadine (PATANOL) 0.1 % ophthalmic solution Place 1 drop into both eyes 2 (two) times daily as needed for allergies.   omeprazole (PRILOSEC) 20 MG capsule Take 1 capsule (20 mg total) by mouth daily.   ondansetron (ZOFRAN-ODT) 4 MG disintegrating tablet Take 4 mg by mouth 2 (two) times daily as needed for nausea/vomiting.   potassium chloride SA (KLOR-CON M) 20 MEQ tablet Take 1 tablet (20 mEq total) by mouth every other day.   PRESCRIPTION MEDICATION Take 1 tablet by mouth as needed (migraine). Nurtec - Samples from doctor   rOPINIRole (REQUIP) 1 MG tablet Take 1 mg by mouth at bedtime.   rosuvastatin (CRESTOR) 40 MG tablet Take 1 tablet (40 mg total) by mouth daily.   spironolactone (ALDACTONE) 25 MG tablet TAKE 0.5 TABLETS BY MOUTH AT BEDTIME.   SUBOXONE 8-2 MG FILM Place 1 strip under the tongue daily as needed (withdrawal).   topiramate (TOPAMAX) 50 MG tablet Take 1 tablet (50 mg total) by mouth daily.   TRELEGY ELLIPTA 100-62.5-25 MCG/ACT AEPB Inhale 1 puff into the lungs daily.      EKGs/Labs/Other Studies Reviewed:    The following studies were reviewed today:         Recent Labs: 02/19/2022: NT-Pro BNP 121 07/15/2022: ALT 23 07/16/2022: B Natriuretic Peptide 48.0 07/17/2022: Hemoglobin 14.0; Platelets 234 08/07/2022: BUN 9; Creatinine, Ser 0.71; Magnesium 1.9; Potassium 3.6; Sodium 140; TSH 1.160  Recent Lipid Panel    Component Value Date/Time   CHOL 103 08/07/2022 1034   TRIG 106 08/07/2022 1034   HDL 51 08/07/2022 1034   CHOLHDL 2.0 08/07/2022 1034   CHOLHDL 4.4 03/20/2021 0526   VLDL 7 03/20/2021 0526   LDLCALC 32 08/07/2022 1034    Physical Exam:    VS:  BP 118/60 (BP Location: Right Arm, Patient Position: Sitting, Cuff Size: Normal)   Pulse 66   Ht 5\' 3"  (1.6 m)   Wt 172 lb 12.8 oz (78.4 kg)   SpO2 96%   BMI 30.61 kg/m     Wt Readings from Last 3  Encounters:  09/03/22 172 lb 12.8 oz (78.4 kg)  07/23/22 175 lb 3.2 oz (79.5 kg)  07/16/22 179 lb 1.6 oz (81.2 kg)     GEN:  Well nourished, well developed in no acute distress HEENT: Normal NECK: No JVD; No carotid bruits LYMPHATICS: No lymphadenopathy CARDIAC: RRR, no murmurs, rubs, gallops RESPIRATORY:  Clear to auscultation without rales, wheezing or rhonchi  ABDOMEN: Soft, non-tender, non-distended MUSCULOSKELETAL:  No edema; No deformity  SKIN: Warm and dry NEUROLOGIC:  Alert and oriented x 3 PSYCHIATRIC:  Normal affect    Signed, Norman Herrlich, MD  09/03/2022 9:10 AM    [facility]

## 2022-09-03 ENCOUNTER — Encounter: Payer: Self-pay | Admitting: Cardiology

## 2022-09-03 ENCOUNTER — Ambulatory Visit: Payer: 59 | Attending: Cardiology | Admitting: Cardiology

## 2022-09-03 VITALS — BP 118/60 | HR 66 | Ht 63.0 in | Wt 172.8 lb

## 2022-09-03 DIAGNOSIS — I5042 Chronic combined systolic (congestive) and diastolic (congestive) heart failure: Secondary | ICD-10-CM

## 2022-09-03 DIAGNOSIS — I255 Ischemic cardiomyopathy: Secondary | ICD-10-CM

## 2022-09-03 DIAGNOSIS — I25118 Atherosclerotic heart disease of native coronary artery with other forms of angina pectoris: Secondary | ICD-10-CM

## 2022-09-03 DIAGNOSIS — E7801 Familial hypercholesterolemia: Secondary | ICD-10-CM | POA: Diagnosis not present

## 2022-09-03 DIAGNOSIS — I2089 Other forms of angina pectoris: Secondary | ICD-10-CM | POA: Diagnosis not present

## 2022-09-03 NOTE — Patient Instructions (Signed)
Medication Instructions:  Your physician recommends that you continue on your current medications as directed. Please refer to the Current Medication list given to you today.  *If you need a refill on your cardiac medications before your next appointment, please call your pharmacy*   Lab Work: NONE If you have labs (blood work) drawn today and your tests are completely normal, you will receive your results only by: MyChart Message (if you have MyChart) OR A paper copy in the mail If you have any lab test that is abnormal or we need to change your treatment, we will call you to review the results.   Testing/Procedures: NONE   Follow-Up: At [facility], you and your health needs are our priority.  As part of our continuing mission to provide you with exceptional heart care, we have created designated Provider Care Teams.  These Care Teams include your primary Cardiologist (physician) and Advanced Practice Providers (APPs -  Physician Assistants and Nurse Practitioners) who all work together to provide you with the care you need, when you need it.  We recommend signing up for the patient portal called "MyChart".  Sign up information is provided on this After Visit Summary.  MyChart is used to connect with patients for Virtual Visits (Telemedicine).  Patients are able to view lab/test results, encounter notes, upcoming appointments, etc.  Non-urgent messages can be sent to your provider as well.   To learn more about what you can do with MyChart, go to [email or web address].    Your next appointment:   3 month(s)  Provider:   Brian Munley, MD    Other Instructions   

## 2022-09-04 ENCOUNTER — Other Ambulatory Visit: Payer: Self-pay | Admitting: Physician Assistant

## 2022-09-04 DIAGNOSIS — J4530 Mild persistent asthma, uncomplicated: Secondary | ICD-10-CM | POA: Diagnosis not present

## 2022-09-04 DIAGNOSIS — G4733 Obstructive sleep apnea (adult) (pediatric): Secondary | ICD-10-CM | POA: Diagnosis not present

## 2022-09-04 DIAGNOSIS — G2581 Restless legs syndrome: Secondary | ICD-10-CM | POA: Diagnosis not present

## 2022-09-04 DIAGNOSIS — R5383 Other fatigue: Secondary | ICD-10-CM | POA: Diagnosis not present

## 2022-09-05 DIAGNOSIS — I2089 Other forms of angina pectoris: Secondary | ICD-10-CM | POA: Diagnosis not present

## 2022-09-09 ENCOUNTER — Other Ambulatory Visit: Payer: Self-pay | Admitting: Cardiology

## 2022-09-09 ENCOUNTER — Other Ambulatory Visit: Payer: Self-pay | Admitting: Internal Medicine

## 2022-09-09 DIAGNOSIS — Z79899 Other long term (current) drug therapy: Secondary | ICD-10-CM | POA: Diagnosis not present

## 2022-09-10 DIAGNOSIS — I2089 Other forms of angina pectoris: Secondary | ICD-10-CM | POA: Diagnosis not present

## 2022-09-15 DIAGNOSIS — I2089 Other forms of angina pectoris: Secondary | ICD-10-CM | POA: Diagnosis not present

## 2022-09-17 DIAGNOSIS — I2089 Other forms of angina pectoris: Secondary | ICD-10-CM | POA: Diagnosis not present

## 2022-09-19 DIAGNOSIS — I2089 Other forms of angina pectoris: Secondary | ICD-10-CM | POA: Diagnosis not present

## 2022-09-24 DIAGNOSIS — I2089 Other forms of angina pectoris: Secondary | ICD-10-CM | POA: Diagnosis not present

## 2022-09-29 ENCOUNTER — Other Ambulatory Visit: Payer: Self-pay | Admitting: Internal Medicine

## 2022-09-29 DIAGNOSIS — I2089 Other forms of angina pectoris: Secondary | ICD-10-CM | POA: Diagnosis not present

## 2022-10-03 DIAGNOSIS — I2089 Other forms of angina pectoris: Secondary | ICD-10-CM | POA: Diagnosis not present

## 2022-10-06 DIAGNOSIS — I2089 Other forms of angina pectoris: Secondary | ICD-10-CM | POA: Diagnosis not present

## 2022-10-07 DIAGNOSIS — Z79899 Other long term (current) drug therapy: Secondary | ICD-10-CM | POA: Diagnosis not present

## 2022-10-08 DIAGNOSIS — I2089 Other forms of angina pectoris: Secondary | ICD-10-CM | POA: Diagnosis not present

## 2022-10-10 DIAGNOSIS — I2089 Other forms of angina pectoris: Secondary | ICD-10-CM | POA: Diagnosis not present

## 2022-10-13 DIAGNOSIS — I2089 Other forms of angina pectoris: Secondary | ICD-10-CM | POA: Diagnosis not present

## 2022-10-15 DIAGNOSIS — I2089 Other forms of angina pectoris: Secondary | ICD-10-CM | POA: Diagnosis not present

## 2022-10-22 DIAGNOSIS — I2089 Other forms of angina pectoris: Secondary | ICD-10-CM | POA: Diagnosis not present

## 2022-10-22 DIAGNOSIS — I209 Angina pectoris, unspecified: Secondary | ICD-10-CM | POA: Diagnosis not present

## 2022-10-23 ENCOUNTER — Ambulatory Visit: Payer: 59 | Admitting: Cardiology

## 2022-10-24 DIAGNOSIS — I209 Angina pectoris, unspecified: Secondary | ICD-10-CM | POA: Diagnosis not present

## 2022-10-24 DIAGNOSIS — I2089 Other forms of angina pectoris: Secondary | ICD-10-CM | POA: Diagnosis not present

## 2022-10-27 DIAGNOSIS — I2089 Other forms of angina pectoris: Secondary | ICD-10-CM | POA: Diagnosis not present

## 2022-10-27 DIAGNOSIS — I209 Angina pectoris, unspecified: Secondary | ICD-10-CM | POA: Diagnosis not present

## 2022-10-29 DIAGNOSIS — I209 Angina pectoris, unspecified: Secondary | ICD-10-CM | POA: Diagnosis not present

## 2022-10-29 DIAGNOSIS — I2089 Other forms of angina pectoris: Secondary | ICD-10-CM | POA: Diagnosis not present

## 2022-10-31 DIAGNOSIS — I209 Angina pectoris, unspecified: Secondary | ICD-10-CM | POA: Diagnosis not present

## 2022-10-31 DIAGNOSIS — I2089 Other forms of angina pectoris: Secondary | ICD-10-CM | POA: Diagnosis not present

## 2022-11-04 DIAGNOSIS — Z79899 Other long term (current) drug therapy: Secondary | ICD-10-CM | POA: Diagnosis not present

## 2022-11-05 DIAGNOSIS — I2089 Other forms of angina pectoris: Secondary | ICD-10-CM | POA: Diagnosis not present

## 2022-11-05 DIAGNOSIS — I209 Angina pectoris, unspecified: Secondary | ICD-10-CM | POA: Diagnosis not present

## 2022-11-07 ENCOUNTER — Other Ambulatory Visit: Payer: Self-pay

## 2022-11-07 DIAGNOSIS — I209 Angina pectoris, unspecified: Secondary | ICD-10-CM | POA: Diagnosis not present

## 2022-11-07 DIAGNOSIS — I2089 Other forms of angina pectoris: Secondary | ICD-10-CM | POA: Diagnosis not present

## 2022-11-07 MED ORDER — AIMOVIG 70 MG/ML ~~LOC~~ SOAJ: 70.0000 mg | SUBCUTANEOUS | 1 refills | Status: DC

## 2022-11-07 NOTE — Progress Notes (Signed)
Rx Refill

## 2022-11-10 DIAGNOSIS — I209 Angina pectoris, unspecified: Secondary | ICD-10-CM | POA: Diagnosis not present

## 2022-11-10 DIAGNOSIS — I2089 Other forms of angina pectoris: Secondary | ICD-10-CM | POA: Diagnosis not present

## 2022-11-10 NOTE — Progress Notes (Signed)
 " Cardiology Office Note:  .   Date:  11/11/2022  ID:  Maria Ellis, DOB [birth year], MRN [id number] PCP: Fleeta Valeria Mayo, MD  [facility] Providers Cardiologist:  None    History of Present Illness: Maria Ellis is a 37 y.o. female with a past medical history of V-fib, cardiac arrest, CAD, HFrEF, spina bifida with hydrocephalus, dyslipidemia, history of Chiari malformation.  07/16/2022 echo EF 40 to 45%, LV mildly decreased function, positive RWMA, trivial MR 07/16/2022 left heart cath previously placed M LAD stent widely patent, EF 35 to 45%, nonobstructive disease in the circumflex or RCA 03/21/2021 echo EF 35 to 40%, moderately decreased LV function, grade 1 DD, moderate hypokinesis of the LV, entire septal wall, aneurysm septal wall and apical segment 03/20/2021 cardiac catheterization severe single-vessel CAD with 100% thrombotic occlusion of the LAD just after D1  She previously had an out-of-hospital cardiac arrest followed by PCI and DES to LAD in March 2023.  She was most recently evaluated by Dr. Monetta on 09/03/2022, was doing well from a cardiac perspective, participating in cardiac rehab, advised to follow-up in 3 months.  She presents today for follow-up for CAD and ischemic cardiomyopathy.  She has been doing good from a cardiac perspective, has been bothered by hypotension.  We reviewed her fluid intake she is drinking approximately 64 ounces of fluid per day, discussed wearing compression socks however she does not think she would be able to tolerate this.  She continues to workout with cardiac rehab her last appointment is coming up at the end of this month she then plans to switch to another wellness program.  She weighs herself frequently throughout the week, she is well versed  on what to do if she gains weight. She denies chest pain, palpitations, dyspnea, pnd, orthopnea, n, v, dizziness, syncope, edema, weight gain, or early satiety.   ROS: Review of Systems   Constitutional:  Positive for malaise/fatigue (when BP is low).  All other systems reviewed and are negative.    Studies Reviewed: .        Cardiac Studies & Procedures   CARDIAC CATHETERIZATION  CARDIAC CATHETERIZATION 07/16/2022  Narrative   Previously placed Mid LAD stent of unknown type is  widely patent.   There is mild left ventricular systolic dysfunction.   LV end diastolic pressure is moderately elevated.   The left ventricular ejection fraction is 35-45% by visual estimate.  Patent mid LAD stent without restenosis No obstructive disease in the Circumflex or RCA LVEDP 24 mmHg  Recommendations: medical management of CAD. Given elevated LV filling pressure, she may benefit from additional diuresis. Will give another dose of IV Lasix  today. Echo pending.  Findings Coronary Findings Diagnostic  Dominance: Right  Left Anterior Descending Previously placed Mid LAD stent of unknown type is  widely patent.  Intervention  No interventions have been documented.   CARDIAC CATHETERIZATION  CARDIAC CATHETERIZATION 03/20/2021  Narrative   CULPRIT LESION: Prox LAD to Mid LAD lesion is 100% stenosed.   A drug-eluting stent was successfully placed using a SYNERGY XD 2.50X28.  Postdilated to 3.1 mm.   Post intervention, there is a 0% residual stenosis.   --------------------------------------   Otherwise angiographically normal RCA, RI and LCx   --------------------------------------   There is moderate left ventricular systolic dysfunction.  The left ventricular ejection fraction is 35-45% by visual estimate.   LV end diastolic pressure is severely elevated.   There is no aortic valve stenosis.  SUMMARY Cardiac arrest with ventricular fibrillation and ROSC secondary to Anterior STEMI Severe single-vessel CAD with 100% thrombotic occlusion of the LAD just after D1 Successful DES PCI of LAD restoring TIMI-3 flow using Synergy XD DES 2.5 x 28 mm stent postdilated to 3.1  mm. Acute combined systolic and diastolic heart failure: EF estimated roughly 40% by LV gram with EDP of 28 to 30 mmHg.   RECOMMENDATIONS We will continue lidocaine  and Aggrastat  drip for 2 to 3 hours post PCI to allow for further stabilization Check 2D echocardiogram on March 2 to allow time for stabilization Given 4 mg IV Lasix  post-cath-reassess Start low-dose beta-blocker-carvedilol  3.125 mg twice daily.  We will need to titrate as blood pressure tolerates and consider addition of ARB/ARNI and spironolactone . Increase statin dose to 80 mg daily Smoking station counseling    Alm Clay, MD  Findings Coronary Findings Diagnostic  Dominance: Right  Left Anterior Descending Prox LAD to Mid LAD lesion is 100% stenosed. Vessel is the culprit lesion. The lesion is type A, located at the major branch, segmental, thrombotic and ulcerative.  First Diagonal Branch Vessel is small in size.  Second Diagonal Branch Vessel is small in size.  Second Septal Branch Vessel is small in size.  Third Diagonal Branch Vessel is small in size.  Ramus Intermedius Vessel is large.  Left Circumflex Vessel is moderate in size.  First Obtuse Marginal Branch Vessel is small in size.  Right Coronary Artery Vessel is large.  Acute Marginal Branch Vessel is small in size.  Right Ventricular Branch Vessel is small in size.  Right Posterior Descending Artery Vessel is moderate in size.  First Right Posterolateral Branch Vessel is small in size.  Second Right Posterolateral Branch Vessel is moderate in size.  Third Right Posterolateral Branch Vessel is small in size.  Intervention  Prox LAD to Mid LAD lesion Stent Lesion length:  26 mm. CATH VISTA GUIDE 6FR XBLAD3.5 guide catheter was inserted. Lesion crossed with guidewire using a WIRE ASAHI PROWATER 180CM. Pre-stent angioplasty was performed using a BALLN SAPPHIRE 2.0X12. Maximum pressure:  12 atm. Inflation time:  20 sec. A  drug-eluting stent was successfully placed using a SYNERGY XD 2.50X28. Maximum pressure: 16 atm. Inflation time: 30 sec. Stent strut is well apposed. Postdilated to 3.1 mm Post-stent angioplasty was performed using a BALLN SAPPHIRE [place] 3.0X18. Maximum pressure:  18 atm. Inflation time:  20 sec. Post-Intervention Lesion Assessment The intervention was successful. Pre-interventional TIMI flow is 0. Post-intervention TIMI flow is 3. Treated lesion length:  28 mm. No complications occurred at this lesion. There is a 0% residual stenosis post intervention.     ECHOCARDIOGRAM  ECHOCARDIOGRAM COMPLETE 07/16/2022  Narrative ECHOCARDIOGRAM REPORT    Patient Name:   Maria Ellis Date of Exam: 07/16/2022 Medical Rec #:  [id number]     Height:       63.0 in Accession #:    [id number]    Weight:       179.1 lb Date of Birth:  1985-06-27     BSA:          1.845 m Patient Age:    37 years      BP:           107/61 mmHg Patient Gender: F             HR:           64 bpm. Exam Location:  Inpatient  Procedure: 2D Echo, Cardiac Doppler, Color Doppler  and Intracardiac Opacification Agent  Indications:    I25.110 Atherosclerotic heart disease of native coronary artery with unstable angina pectoris  History:        Patient has prior history of Echocardiogram examinations, most recent 03/03/2022. Previous Myocardial Infarction and CAD, Abnormal ECG, Arrythmias:Cardiac Arrest and Ventricular Fibrillation, Signs/Symptoms:Chest Pain and Syncope; Risk Factors:Hypertension, Current Smoker and Dyslipidemia. VP shunt.  Sonographer:    Ellouise Mose RDCS Referring Phys: [id number] DENNIS NARCISSE JR   Sonographer Comments: Technically difficult study due to poor echo windows. Image acquisition challenging due to patient body habitus. IMPRESSIONS   1. Left ventricular ejection fraction, by estimation, is 40 to 45%. The left ventricle has mildly decreased function. The left ventricle demonstrates regional wall motion  abnormalities (see scoring diagram/findings for description). Left ventricular diastolic parameters were normal. 2. Right ventricular systolic function is normal. The right ventricular size is normal. Tricuspid regurgitation signal is inadequate for assessing PA pressure. 3. The mitral valve is grossly normal. Trivial mitral valve regurgitation. No evidence of mitral stenosis. 4. The aortic valve was not well visualized. Aortic valve regurgitation is not visualized. No aortic stenosis is present. 5. The inferior vena cava is normal in size with greater than 50% respiratory variability, suggesting right atrial pressure of 3 mmHg.  Comparison(s): No significant change from prior study.  Conclusion(s)/Recommendation(s): Findings consistent with ischemic cardiomyopathy.  FINDINGS Left Ventricle: Left ventricular ejection fraction, by estimation, is 40 to 45%. The left ventricle has mildly decreased function. The left ventricle demonstrates regional wall motion abnormalities. Definity  contrast agent was given IV to delineate the left ventricular endocardial borders. The left ventricular internal cavity size was normal in size. There is no left ventricular hypertrophy. Left ventricular diastolic parameters were normal.   LV Wall Scoring: The mid inferoseptal segment, apical septal segment, apical inferior segment, and apex are hypokinetic.  Right Ventricle: The right ventricular size is normal. No increase in right ventricular wall thickness. Right ventricular systolic function is normal. Tricuspid regurgitation signal is inadequate for assessing PA pressure.  Left Atrium: Left atrial size was normal in size.  Right Atrium: Right atrial size was normal in size.  Pericardium: There is no evidence of pericardial effusion.  Mitral Valve: The mitral valve is grossly normal. Trivial mitral valve regurgitation. No evidence of mitral valve stenosis.  Tricuspid Valve: The tricuspid valve is grossly  normal. Tricuspid valve regurgitation is trivial. No evidence of tricuspid stenosis.  Aortic Valve: The aortic valve was not well visualized. Aortic valve regurgitation is not visualized. No aortic stenosis is present.  Pulmonic Valve: The pulmonic valve was grossly normal. Pulmonic valve regurgitation is not visualized. No evidence of pulmonic stenosis.  Aorta: The aortic root and ascending aorta are structurally normal, with no evidence of dilitation.  Venous: The inferior vena cava is normal in size with greater than 50% respiratory variability, suggesting right atrial pressure of 3 mmHg.  IAS/Shunts: The atrial septum is grossly normal.   LEFT VENTRICLE PLAX 2D LVIDd:         5.40 cm      Diastology LVIDs:         4.30 cm      LV e' medial:    9.57 cm/s LV PW:         0.90 cm      LV E/e' medial:  10.9 LV IVS:        0.70 cm      LV e' lateral:   12.60 cm/s LVOT diam:  2.00 cm      LV E/e' lateral: 8.3 LV SV:         79 LV SV Index:   43 LVOT Area:     3.14 cm  LV Volumes (MOD) LV vol d, MOD A2C: 73.8 ml LV vol d, MOD A4C: 139.0 ml LV vol s, MOD A2C: 42.0 ml LV vol s, MOD A4C: 76.6 ml LV SV MOD A2C:     31.8 ml LV SV MOD A4C:     139.0 ml LV SV MOD BP:      42.4 ml  RIGHT VENTRICLE             IVC RV S prime:     15.60 cm/s  IVC diam: 1.80 cm TAPSE (M-mode): 3.1 cm  LEFT ATRIUM             Index        RIGHT ATRIUM           Index LA diam:        2.50 cm 1.35 cm/m   RA Area:     11.90 cm LA Vol (A2C):   29.7 ml 16.10 ml/m  RA Volume:   25.10 ml  13.60 ml/m LA Vol (A4C):   37.0 ml 20.05 ml/m LA Biplane Vol: 34.2 ml 18.54 ml/m AORTIC VALVE LVOT Vmax:   132.00 cm/s LVOT Vmean:  84.000 cm/s LVOT VTI:    0.253 m  AORTA Ao Root diam: 3.00 cm Ao Asc diam:  2.60 cm  MITRAL VALVE MV Area (PHT): 4.79 cm     SHUNTS MV Decel Time: 159 msec     Systemic VTI:  0.25 m MV E velocity: 104.60 cm/s  Systemic Diam: 2.00 cm MV A velocity: 55.10 cm/s MV E/A ratio:   1.90  Darryle Decent MD Electronically signed by Darryle Decent MD Signature Date/Time: 07/16/2022/3:48:21 PM    Final    MONITORS  LONG TERM MONITOR-LIVE TELEMETRY (3-14 DAYS) 06/03/2021  Narrative Patch Wear Time:  13 days and 14 hours (2023-04-19T15:56:23-0400 to 2023-05-03T06:38:18-0400)  Patient had a min HR of 43 bpm, max HR of 120 bpm, and avg HR of 63 bpm. Predominant underlying rhythm was Sinus Rhythm.  Isolated SVEs were rare (<1.0%), SVE Couplets were rare (<1.0%), and no SVE Triplets were present.  Isolated VEs were rare (<1.0%), VE Couplets were rare (<1.0%), and no VE Triplets were present. Ventricular Bigeminy and Trigeminy were present.  There were 2 triggered and 4 diary events, all SRTH 1 with a single PVC.           Risk Assessment/Calculations:             Physical Exam:   VS:  BP 104/70 (BP Location: Right Arm, Patient Position: Sitting, Cuff Size: Normal)   Pulse 60   Ht 5' 3 (1.6 m)   Wt 172 lb (78 kg)   SpO2 97%   BMI 30.47 kg/m    Wt Readings from Last 3 Encounters:  11/11/22 172 lb (78 kg)  09/03/22 172 lb 12.8 oz (78.4 kg)  07/23/22 175 lb 3.2 oz (79.5 kg)    GEN: Well nourished, well developed in no acute distress NECK: No JVD; No carotid bruits CARDIAC: RRR, no murmurs, rubs, gallops RESPIRATORY:  Clear to auscultation without rales, wheezing or rhonchi  ABDOMEN: Soft, non-tender, non-distended EXTREMITIES:  No edema; No deformity   ASSESSMENT AND PLAN: .   CAD-s/p DES to M LAD in 2023.  She continues to participate in  cardiac rehab, her last visit is coming up.  She does plan to enroll in a wellness program. She denies chest pain, palpitations, dyspnea, pnd, orthopnea, n, v, dizziness, syncope, edema, weight gain, or early satiety.  Continue aspirin  81 mg daily, continue Zetia  10 mg daily, continue nitroglycerin  as needed-as she has not needed, continue Crestor  40 mg daily. HFrEF-most recent EF was 40 to 45%, NYHA class I, euvolemic.   GDMT has been prohibited secondary to hypotension.  Continue spironolactone  12.5 mg every other day, continue Farxiga  10 mg daily.  Continue Lasix  but we will change this to as needed for weight gain of 3 pounds in 1 day or 5 pounds in 1 week. Hypotension-blood pressure in the office today is 104/70, she checks it at home has been in the 80s systolic, this is accompanied by profound fatigue.  Will change her Lasix  to as needed for weight gain of 3 pounds in 1 day or 5 pounds in 1 week.  It appears she is staying well-hydrated, we reviewed her fluid intake and she is drinking approximately 64 ounces of fluid a day.  Discussed wearing compression socks however she does not feel that this will be achievable for her. Dyslipidemia- LDL is currently well-controlled at 32 on 08/07/2022, continue Crestor  40 mg daily, continue Zetia  10 mg daily.       Dispo: Change Lasix  to as needed for weight gain of 3 pounds in 1 day or 5 pounds in 1 week.  Signed, Delon JAYSON Hoover, NP  "

## 2022-11-11 ENCOUNTER — Ambulatory Visit: Payer: 59 | Attending: Cardiology | Admitting: Cardiology

## 2022-11-11 ENCOUNTER — Other Ambulatory Visit: Payer: Self-pay | Admitting: Internal Medicine

## 2022-11-11 ENCOUNTER — Encounter: Payer: Self-pay | Admitting: Cardiology

## 2022-11-11 VITALS — BP 104/70 | HR 60 | Ht 63.0 in | Wt 172.0 lb

## 2022-11-11 DIAGNOSIS — E78019 Familial hypercholesterolemia, unspecified: Secondary | ICD-10-CM

## 2022-11-11 DIAGNOSIS — I5042 Chronic combined systolic (congestive) and diastolic (congestive) heart failure: Secondary | ICD-10-CM

## 2022-11-11 DIAGNOSIS — I2510 Atherosclerotic heart disease of native coronary artery without angina pectoris: Secondary | ICD-10-CM | POA: Diagnosis not present

## 2022-11-11 DIAGNOSIS — Z9861 Coronary angioplasty status: Secondary | ICD-10-CM | POA: Diagnosis not present

## 2022-11-11 DIAGNOSIS — I25118 Atherosclerotic heart disease of native coronary artery with other forms of angina pectoris: Secondary | ICD-10-CM | POA: Diagnosis not present

## 2022-11-11 DIAGNOSIS — I255 Ischemic cardiomyopathy: Secondary | ICD-10-CM | POA: Diagnosis not present

## 2022-11-11 DIAGNOSIS — I4901 Ventricular fibrillation: Secondary | ICD-10-CM

## 2022-11-11 DIAGNOSIS — I959 Hypotension, unspecified: Secondary | ICD-10-CM

## 2022-11-11 DIAGNOSIS — E7801 Familial hypercholesterolemia: Secondary | ICD-10-CM | POA: Diagnosis not present

## 2022-11-11 MED ORDER — POTASSIUM CHLORIDE CRYS ER 20 MEQ PO TBCR: 20.0000 meq | EXTENDED_RELEASE_TABLET | ORAL | 3 refills | Status: DC

## 2022-11-11 MED ORDER — FUROSEMIDE 40 MG PO TABS: 40.0000 mg | ORAL_TABLET | Freq: Every day | ORAL | 3 refills | Status: DC | PRN

## 2022-11-11 NOTE — Patient Instructions (Signed)
Medication Instructions: Your physician has recommended you make the following change in your medication:  Change Lasix to as needed for wt gain of 3 pds in one day or 5 pds in 1 week   *If you need a refill on your cardiac medications before your next appointment, please call your pharmacy*   Lab Work: NONE  If you have labs (blood work) drawn today and your tests are completely normal, you will receive your results only by: MyChart Message (if you have MyChart) OR A paper copy in the mail If you have any lab test that is abnormal or we need to change your treatment, we will call you to review the results.   Testing/Procedures: NONE   Follow-Up: At [facility], you and your health needs are our priority.  As part of our continuing mission to provide you with exceptional heart care, we have created designated Provider Care Teams.  These Care Teams include your primary Cardiologist (physician) and Advanced Practice Providers (APPs -  Physician Assistants and Nurse Practitioners) who all work together to provide you with the care you need, when you need it.  We recommend signing up for the patient portal called "MyChart".  Sign up information is provided on this After Visit Summary.  MyChart is used to connect with patients for Virtual Visits (Telemedicine).  Patients are able to view lab/test results, encounter notes, upcoming appointments, etc.  Non-urgent messages can be sent to your provider as well.   To learn more about what you can do with MyChart, go to [email or web address].    Your next appointment:   3 month(s)  Provider:   Norman Herrlich, MD    Other Instructions

## 2022-11-17 DIAGNOSIS — G4733 Obstructive sleep apnea (adult) (pediatric): Secondary | ICD-10-CM | POA: Diagnosis not present

## 2022-11-17 DIAGNOSIS — J45909 Unspecified asthma, uncomplicated: Secondary | ICD-10-CM | POA: Diagnosis not present

## 2022-11-18 ENCOUNTER — Telehealth: Payer: Self-pay | Admitting: Cardiology

## 2022-11-18 MED ORDER — EZETIMIBE 10 MG PO TABS: 10.0000 mg | ORAL_TABLET | Freq: Every day | ORAL | 2 refills | Status: DC

## 2022-11-18 NOTE — Telephone Encounter (Signed)
*  STAT* If patient is at the pharmacy, call can be transferred to refill team.   1. Which medications need to be refilled? (please list name of each medication and dose if known)  ezetimibe (ZETIA) 10 MG tablet  2. Which pharmacy/location (including street and city if local pharmacy) is medication to be sent to? [facility] - [place], [place] - [address]  3. Do they need a 30 day or 90 day supply?   90 day supply  Per Nallely, patient is completely out of medication.

## 2022-11-19 DIAGNOSIS — I209 Angina pectoris, unspecified: Secondary | ICD-10-CM | POA: Diagnosis not present

## 2022-11-19 DIAGNOSIS — I2089 Other forms of angina pectoris: Secondary | ICD-10-CM | POA: Diagnosis not present

## 2022-11-21 DIAGNOSIS — I2089 Other forms of angina pectoris: Secondary | ICD-10-CM | POA: Diagnosis not present

## 2022-11-24 ENCOUNTER — Other Ambulatory Visit: Payer: Self-pay | Admitting: Internal Medicine

## 2022-11-26 ENCOUNTER — Other Ambulatory Visit: Payer: Self-pay | Admitting: *Deleted

## 2022-11-26 DIAGNOSIS — I2089 Other forms of angina pectoris: Secondary | ICD-10-CM | POA: Diagnosis not present

## 2022-11-26 MED ORDER — POTASSIUM CHLORIDE CRYS ER 20 MEQ PO TBCR: 20.0000 meq | EXTENDED_RELEASE_TABLET | ORAL | 2 refills | Status: DC

## 2022-12-03 DIAGNOSIS — I2089 Other forms of angina pectoris: Secondary | ICD-10-CM | POA: Diagnosis not present

## 2022-12-04 ENCOUNTER — Ambulatory Visit: Payer: 59 | Admitting: Cardiology

## 2022-12-09 DIAGNOSIS — Z79899 Other long term (current) drug therapy: Secondary | ICD-10-CM | POA: Diagnosis not present

## 2022-12-16 DIAGNOSIS — H66003 Acute suppurative otitis media without spontaneous rupture of ear drum, bilateral: Secondary | ICD-10-CM | POA: Diagnosis not present

## 2022-12-28 DIAGNOSIS — Z23 Encounter for immunization: Secondary | ICD-10-CM | POA: Diagnosis not present

## 2022-12-28 DIAGNOSIS — S50811A Abrasion of right forearm, initial encounter: Secondary | ICD-10-CM | POA: Diagnosis not present

## 2022-12-28 DIAGNOSIS — W540XXA Bitten by dog, initial encounter: Secondary | ICD-10-CM | POA: Diagnosis not present

## 2022-12-28 DIAGNOSIS — S51851A Open bite of right forearm, initial encounter: Secondary | ICD-10-CM | POA: Diagnosis not present

## 2022-12-28 DIAGNOSIS — Z203 Contact with and (suspected) exposure to rabies: Secondary | ICD-10-CM | POA: Diagnosis not present

## 2022-12-31 DIAGNOSIS — Z23 Encounter for immunization: Secondary | ICD-10-CM | POA: Diagnosis not present

## 2022-12-31 DIAGNOSIS — Z2914 Encounter for prophylactic rabies immune globin: Secondary | ICD-10-CM | POA: Diagnosis not present

## 2022-12-31 DIAGNOSIS — Z203 Contact with and (suspected) exposure to rabies: Secondary | ICD-10-CM | POA: Diagnosis not present

## 2023-01-02 DIAGNOSIS — Z9622 Myringotomy tube(s) status: Secondary | ICD-10-CM | POA: Diagnosis not present

## 2023-01-27 ENCOUNTER — Telehealth: Payer: Self-pay | Admitting: Cardiology

## 2023-01-27 NOTE — Telephone Encounter (Signed)
 Pt c/o BP issue: STAT if pt c/o blurred vision, one-sided weakness or slurred speech  1. What are your last 5 BP readings?   92/46 (today around 12:30 pm)  2. Are you having any other symptoms (ex. Dizziness, headache, blurred vision, passed out)?   Fatigue, dizziness  3. What is your BP issue?   Patient stated she is concerned about her low BP readings and sluggish feeling.  Patient stated she has had restless leg syndrome but in her arms.

## 2023-01-28 ENCOUNTER — Other Ambulatory Visit: Payer: Self-pay

## 2023-01-28 DIAGNOSIS — I5022 Chronic systolic (congestive) heart failure: Secondary | ICD-10-CM

## 2023-01-28 NOTE — Telephone Encounter (Signed)
 Called the patient and informed her of Dr. Posey recommendations below:  Hold off on the spironolactone  for a day or 2; increase salt and water in the diet just a little bit.  Keep a track of blood pressures.  Good to get a Chem-7 now.  If she feels worse she needs to go to urgent care.  Copy primary  Patient verbalized understanding and had no further questions at this time.  Labs were ordered via Epic

## 2023-01-28 NOTE — Telephone Encounter (Signed)
 Called patient and she stated that she had recently started having low blood pressures. Her recent blood pressures are below:  84/58 86/52 87/54  94/56  She has not started any new medications recently and states that she does not take any medication for her blood pressure. Patient states that she also feels fatigued, dizzy and very sluggish.

## 2023-01-29 LAB — BASIC METABOLIC PANEL
BUN/Creatinine Ratio: 15 (ref 9–23)
BUN: 10 mg/dL (ref 6–20)
CO2: 22 mmol/L (ref 20–29)
Calcium: 9.4 mg/dL (ref 8.7–10.2)
Chloride: 105 mmol/L (ref 96–106)
Creatinine, Ser: 0.67 mg/dL (ref 0.57–1.00)
Glucose: 93 mg/dL (ref 70–99)
Potassium: 4.4 mmol/L (ref 3.5–5.2)
Sodium: 142 mmol/L (ref 134–144)
eGFR: 115 mL/min/{1.73_m2} (ref >59)

## 2023-02-01 ENCOUNTER — Encounter: Payer: Self-pay | Admitting: Cardiology

## 2023-02-10 ENCOUNTER — Encounter: Payer: Self-pay | Admitting: Cardiology

## 2023-02-10 NOTE — Progress Notes (Deleted)
Cardiology Office Note:    Date:  02/10/2023   ID:  Maria Ellis, DOB 1985-08-05, MRN [id number]  PCP:  Crist Fat, MD  Cardiologist:  Norman Herrlich, MD    Referring MD: Leonia Reader, Barbara Cower, MD    ASSESSMENT:    No diagnosis found. PLAN:    In order of problems listed above:  ***   Next appointment: ***   Medication Adjustments/Labs and Tests Ordered: Current medicines are reviewed at length with the patient today.  Concerns regarding medicines are outlined above.  No orders of the defined types were placed in this encounter.  No orders of the defined types were placed in this encounter.    History of Present Illness:    Maria Ellis is a 38 y.o. female with a hx of coronary artery disease ischemic cardiomyopathy with systolic heart failure familial hyperlipidemia previous presentation out-of-hospital cardiac arrest VF and subsequent PCI and stent LAD March 2023 with initial severe reduction in ejection fraction that improved with guideline directed therapy and did not require an ICD prophylactically last seen 09/03/2022.  Recently has been multiple calls to the office regarding low blood pressure and I asked for a nurse visit to be arranged to do simultaneous office and device blood pressure. Compliance with diet, lifestyle and medications: *** Past Medical History:  Diagnosis Date   Acute appendicitis 05/18/2013   Acute on chronic systolic heart failure (HCC) 07/17/2022   Anterior ST segment elevation (HCC) 03/20/2021   Bell's palsy    Cardiac arrest with ventricular fibrillation (HCC) 03/20/2021   Chest pain 07/15/2022   Depression with anxiety 04/06/2017   Elevated troponin 03/20/2021   Headache    High cholesterol    History of Chiari malformation    Hydrocephalus (HCC)    Hypotension 08/25/2021   Left-sided Bell's palsy 08/21/2016   Myocardial infarct [facility])    Near syncope    Right-sided chest pain 03/20/2021   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      EKGs/Labs/Other Studies Reviewed:    The following studies were reviewed today:  Cardiac Studies & Procedures   CARDIAC CATHETERIZATION  CARDIAC CATHETERIZATION 07/16/2022  Narrative   Previously placed Mid LAD stent of unknown type is  widely patent.   There is mild left ventricular systolic dysfunction.   LV end diastolic pressure is moderately elevated.   The left ventricular ejection fraction is 35-45% by visual estimate.  Patent mid LAD stent without restenosis No obstructive disease in the Circumflex or RCA LVEDP 24 mmHg  Recommendations: medical management of CAD. Given elevated LV filling pressure, she may benefit from additional diuresis. Will give another dose of IV Lasix today. Echo pending.  Findings Coronary Findings Diagnostic  Dominance: Right  Left Anterior Descending Previously placed Mid LAD stent of unknown type is  widely patent.  Intervention  No interventions have been documented.   CARDIAC CATHETERIZATION  CARDIAC CATHETERIZATION 03/20/2021  Narrative   CULPRIT LESION: Prox LAD to Mid LAD lesion is 100% stenosed.   A drug-eluting stent was successfully placed using a SYNERGY XD 2.50X28.  Postdilated to 3.1 mm.   Post intervention, there is a 0% residual stenosis.   --------------------------------------  Otherwise angiographically normal RCA, RI and LCx   --------------------------------------   There is moderate left ventricular systolic dysfunction.  The left ventricular ejection fraction is 35-45% by visual estimate.   LV end diastolic pressure is severely elevated.   There is no aortic valve  stenosis.  SUMMARY Cardiac arrest with ventricular fibrillation and ROSC secondary to Anterior STEMI Severe single-vessel CAD with 100% thrombotic occlusion of the LAD just after D1 Successful DES PCI of LAD restoring TIMI-3 flow using Synergy XD DES 2.5 x 28 mm stent postdilated to 3.1 mm. Acute combined systolic and diastolic heart failure: EF estimated roughly 40% by LV gram with EDP of 28 to 30 mmHg.   RECOMMENDATIONS We will continue lidocaine and Aggrastat drip for 2 to 3 hours post PCI to allow for further stabilization Check 2D echocardiogram on March 2 to allow time for stabilization Given 4 mg IV Lasix post-cath-reassess Start low-dose beta-blocker-carvedilol 3.125 mg twice daily.  We will need to titrate as blood pressure tolerates and consider addition of ARB/ARNI and spironolactone. Increase statin dose to 80 mg daily Smoking station counseling    Bryan Lemma, MD  Findings Coronary Findings Diagnostic  Dominance: Right  Left Anterior Descending Prox LAD to Mid LAD lesion is 100% stenosed. Vessel is the culprit lesion. The lesion is type A, located at the major branch, segmental, thrombotic and ulcerative.  First Diagonal Branch Vessel is small in size.  Second Diagonal Branch Vessel is small in size.  Second Septal Branch Vessel is small in size.  Third Diagonal Branch Vessel is small in size.  Ramus Intermedius Vessel is large.  Left Circumflex Vessel is moderate in size.  First Obtuse Marginal Branch Vessel is small in size.  Right Coronary Artery Vessel is large.  Acute Marginal Branch Vessel is small in size.  Right Ventricular Branch Vessel is small in size.  Right Posterior Descending Artery Vessel is moderate in size.  First Right Posterolateral Branch Vessel is small in size.  Second Right Posterolateral Branch Vessel is moderate in size.  Third Right Posterolateral Branch Vessel is small in size.  Intervention  Prox LAD  to Mid LAD lesion Stent Lesion length:  26 mm. CATH VISTA GUIDE 6FR XBLAD3.5 guide catheter was inserted. Lesion crossed with guidewire using a WIRE ASAHI PROWATER 180CM. Pre-stent angioplasty was performed using a BALLN SAPPHIRE 2.0X12. Maximum pressure:  12 atm. Inflation time:  20 sec. A drug-eluting stent was successfully placed using a SYNERGY XD 2.50X28. Maximum pressure: 16 atm. Inflation time: 30 sec. Stent strut is well apposed. Postdilated to 3.1 mm Post-stent angioplasty was performed using a BALLN SAPPHIRE [place] 3.0X18. Maximum pressure:  18 atm. Inflation time:  20 sec. Post-Intervention Lesion Assessment The intervention was successful. Pre-interventional TIMI flow is 0. Post-intervention TIMI flow is 3. Treated lesion length:  28 mm. No complications occurred at this lesion. There is a 0% residual stenosis post intervention.    ECHOCARDIOGRAM  ECHOCARDIOGRAM COMPLETE 07/16/2022  Narrative ECHOCARDIOGRAM REPORT    Patient Name:   TELISSA SERFASS Dutil Date of Exam: 07/16/2022 Medical Rec #:  [id number]     Height:       63.0 in Accession #:    [id number]    Weight:       179.1 lb Date of Birth:  1985-04-19     BSA:          1.845 m Patient Age:    37 years      BP:  107/61 mmHg Patient Gender: F             HR:           64 bpm. Exam Location:  Inpatient  Procedure: 2D Echo, Cardiac Doppler, Color Doppler and Intracardiac Opacification Agent  Indications:    I25.110 Atherosclerotic heart disease of native coronary artery with unstable angina pectoris  History:        Patient has prior history of Echocardiogram examinations, most recent 03/03/2022. Previous Myocardial Infarction and CAD, Abnormal ECG, Arrythmias:Cardiac Arrest and Ventricular Fibrillation, Signs/Symptoms:Chest Pain and Syncope; Risk Factors:Hypertension, Current Smoker and Dyslipidemia. VP shunt.  Sonographer:    Sheralyn Boatman RDCS Referring Phys: [id number] DENNIS NARCISSE JR   Sonographer Comments:  Technically difficult study due to poor echo windows. Image acquisition challenging due to patient body habitus. IMPRESSIONS   1. Left ventricular ejection fraction, by estimation, is 40 to 45%. The left ventricle has mildly decreased function. The left ventricle demonstrates regional wall motion abnormalities (see scoring diagram/findings for description). Left ventricular diastolic parameters were normal. 2. Right ventricular systolic function is normal. The right ventricular size is normal. Tricuspid regurgitation signal is inadequate for assessing PA pressure. 3. The mitral valve is grossly normal. Trivial mitral valve regurgitation. No evidence of mitral stenosis. 4. The aortic valve was not well visualized. Aortic valve regurgitation is not visualized. No aortic stenosis is present. 5. The inferior vena cava is normal in size with greater than 50% respiratory variability, suggesting right atrial pressure of 3 mmHg.  Comparison(s): No significant change from prior study.  Conclusion(s)/Recommendation(s): Findings consistent with ischemic cardiomyopathy.  FINDINGS Left Ventricle: Left ventricular ejection fraction, by estimation, is 40 to 45%. The left ventricle has mildly decreased function. The left ventricle demonstrates regional wall motion abnormalities. Definity contrast agent was given IV to delineate the left ventricular endocardial borders. The left ventricular internal cavity size was normal in size. There is no left ventricular hypertrophy. Left ventricular diastolic parameters were normal.   LV Wall Scoring: The mid inferoseptal segment, apical septal segment, apical inferior segment, and apex are hypokinetic.  Right Ventricle: The right ventricular size is normal. No increase in right ventricular wall thickness. Right ventricular systolic function is normal. Tricuspid regurgitation signal is inadequate for assessing PA pressure.  Left Atrium: Left atrial size was normal in  size.  Right Atrium: Right atrial size was normal in size.  Pericardium: There is no evidence of pericardial effusion.  Mitral Valve: The mitral valve is grossly normal. Trivial mitral valve regurgitation. No evidence of mitral valve stenosis.  Tricuspid Valve: The tricuspid valve is grossly normal. Tricuspid valve regurgitation is trivial. No evidence of tricuspid stenosis.  Aortic Valve: The aortic valve was not well visualized. Aortic valve regurgitation is not visualized. No aortic stenosis is present.  Pulmonic Valve: The pulmonic valve was grossly normal. Pulmonic valve regurgitation is not visualized. No evidence of pulmonic stenosis.  Aorta: The aortic root and ascending aorta are structurally normal, with no evidence of dilitation.  Venous: The inferior vena cava is normal in size with greater than 50% respiratory variability, suggesting right atrial pressure of 3 mmHg.  IAS/Shunts: The atrial septum is grossly normal.   LEFT VENTRICLE PLAX 2D LVIDd:         5.40 cm      Diastology LVIDs:         4.30 cm      LV e' medial:    9.57 cm/s LV PW:  0.90 cm      LV E/e' medial:  10.9 LV IVS:        0.70 cm      LV e' lateral:   12.60 cm/s LVOT diam:     2.00 cm      LV E/e' lateral: 8.3 LV SV:         79 LV SV Index:   43 LVOT Area:     3.14 cm  LV Volumes (MOD) LV vol d, MOD A2C: 73.8 ml LV vol d, MOD A4C: 139.0 ml LV vol s, MOD A2C: 42.0 ml LV vol s, MOD A4C: 76.6 ml LV SV MOD A2C:     31.8 ml LV SV MOD A4C:     139.0 ml LV SV MOD BP:      42.4 ml  RIGHT VENTRICLE             IVC RV S prime:     15.60 cm/s  IVC diam: 1.80 cm TAPSE (M-mode): 3.1 cm  LEFT ATRIUM             Index        RIGHT ATRIUM           Index LA diam:        2.50 cm 1.35 cm/m   RA Area:     11.90 cm LA Vol (A2C):   29.7 ml 16.10 ml/m  RA Volume:   25.10 ml  13.60 ml/m LA Vol (A4C):   37.0 ml 20.05 ml/m LA Biplane Vol: 34.2 ml 18.54 ml/m AORTIC VALVE LVOT Vmax:   132.00  cm/s LVOT Vmean:  84.000 cm/s LVOT VTI:    0.253 m  AORTA Ao Root diam: 3.00 cm Ao Asc diam:  2.60 cm  MITRAL VALVE MV Area (PHT): 4.79 cm     SHUNTS MV Decel Time: 159 msec     Systemic VTI:  0.25 m MV E velocity: 104.60 cm/s  Systemic Diam: 2.00 cm MV A velocity: 55.10 cm/s MV E/A ratio:  1.90  Lennie Odor MD Electronically signed by Lennie Odor MD Signature Date/Time: 07/16/2022/3:48:21 PM    Final   MONITORS  LONG TERM MONITOR-LIVE TELEMETRY (3-14 DAYS) 06/03/2021  Narrative Patch Wear Time:  13 days and 14 hours (2023-04-19T15:56:23-0400 to 2023-05-03T06:38:18-0400)  Patient had a min HR of 43 bpm, max HR of 120 bpm, and avg HR of 63 bpm. Predominant underlying rhythm was Sinus Rhythm.  Isolated SVEs were rare (<1.0%), SVE Couplets were rare (<1.0%), and no SVE Triplets were present.  Isolated VEs were rare (<1.0%), VE Couplets were rare (<1.0%), and no VE Triplets were present. Ventricular Bigeminy and Trigeminy were present.  There were 2 triggered and 4 diary events, all SRTH 1 with a single PVC.               Recent Labs: 02/19/2022: NT-Pro BNP 121 07/15/2022: ALT 23 07/16/2022: B Natriuretic Peptide 48.0 07/17/2022: Hemoglobin 14.0; Platelets 234 08/07/2022: Magnesium 1.9; TSH 1.160 01/28/2023: BUN 10; Creatinine, Ser 0.67; Potassium 4.4; Sodium 142  Recent Lipid Panel    Component Value Date/Time   CHOL 103 08/07/2022 1034   TRIG 106 08/07/2022 1034   HDL 51 08/07/2022 1034   CHOLHDL 2.0 08/07/2022 1034   CHOLHDL 4.4 03/20/2021 0526   VLDL 7 03/20/2021 0526   LDLCALC 32 08/07/2022 1034    Physical Exam:    VS:  There were no vitals taken for this visit.    Wt Readings from Last 3  Encounters:  11/11/22 172 lb (78 kg)  09/03/22 172 lb 12.8 oz (78.4 kg)  07/23/22 175 lb 3.2 oz (79.5 kg)     GEN: *** Well nourished, well developed in no acute distress HEENT: Normal NECK: No JVD; No carotid bruits LYMPHATICS: No lymphadenopathy CARDIAC:  ***RRR, no murmurs, rubs, gallops RESPIRATORY:  Clear to auscultation without rales, wheezing or rhonchi  ABDOMEN: Soft, non-tender, non-distended MUSCULOSKELETAL:  No edema; No deformity  SKIN: Warm and dry NEUROLOGIC:  Alert and oriented x 3 PSYCHIATRIC:  Normal affect    Signed, Norman Herrlich, MD  02/10/2023 7:22 PM    [facility]

## 2023-02-11 ENCOUNTER — Ambulatory Visit: Payer: 59 | Admitting: Cardiology

## 2023-02-11 NOTE — Progress Notes (Unsigned)
Cardiology Office Note:    Date:  02/12/2023   ID:  Maria Ellis, DOB Jun 19, 1985, MRN [id number]  PCP:  Crist Fat, MD  Cardiologist:  Norman Herrlich, MD    Referring MD: Crist Fat, MD    ASSESSMENT:    1. Coronary artery disease of native artery of native heart with stable angina pectoris (HCC)   2. Chronic systolic heart failure (HCC)   3. Familial hypercholesterolemia   4. Hypotension, unspecified hypotension type    PLAN:    In order of problems listed above:  Stable CAD continue medical treatment her last echocardiogram EF of 45 to 50% I will ask that we repeat her test Compensated not on Entresto beta-blocker will stop for stage and try to maintain a minimum dose of spironolactone Continue current lipid-lowering treatment   Next appointment: 6 months   Medication Adjustments/Labs and Tests Ordered: Current medicines are reviewed at length with the patient today.  Concerns regarding medicines are outlined above.  No orders of the defined types were placed in this encounter.  No orders of the defined types were placed in this encounter.    History of Present Illness:    Maria Ellis is a 38 y.o. female with a hx of coronary artery disease ischemic cardiomyopathy with systolic heart failure familial hyperlipidemia previous presentation out-of-hospital cardiac arrest VF and subsequent PCI and stent LAD March 2023 with initial severe reduction in ejection fraction that improved with guideline directed therapy and did not require an ICD prophylactically last seen 09/03/2022.  Recently has been multiple calls to the office regarding low blood pressure and I asked for a nurse visit to be arranged to do simultaneous office and device blood pressure.  Compliance with diet, lifestyle and medications: Yes  Recently did not feel well weak fatigued checked home blood pressures and got numbers of 90 or less systolic I asked her to come to the office she was to bring her  cuff she forgot we rechecked her blood pressures I do the myself I get 90/50 right arm sitting 96/50 standing and 92/50 in the left arm sitting Only medication she takes affecting blood pressure is [place] and a minuscule dose of spironolactone She will stop for Ceja let me know if her blood pressures remain less than 100 systolic in 1 week He has not taken high-dose steroids but for completeness we will check a cortisol level TSH CMP and a CBC.  Fortunately she is not having chest pain edema shortness of breath or syncope Past Medical History:  Diagnosis Date   Acute appendicitis 05/18/2013   Acute on chronic systolic heart failure (HCC) 07/17/2022   Anterior ST segment elevation (HCC) 03/20/2021   Bell's palsy    Cardiac arrest with ventricular fibrillation (HCC) 03/20/2021   Chest pain 07/15/2022   Depression with anxiety 04/06/2017   Elevated troponin 03/20/2021   Headache    High cholesterol    History of Chiari malformation    Hydrocephalus (HCC)    Hypotension 08/25/2021   Left-sided Bell's palsy 08/21/2016   Myocardial infarct [facility])    Near syncope    Right-sided chest pain 03/20/2021   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   aspirin 81 MG chewable tablet Chew 1 tablet (81 mg total) by mouth daily.   Cholecalciferol (VITAMIN D3) 1.25 MG (50000 UT) CAPS Take 1 capsule by mouth every 30 (thirty) days.   citalopram (CELEXA) 40 MG tablet TAKE 1 TABLET BY MOUTH DAILY   ezetimibe (ZETIA) 10 MG tablet Take 1  tablet (10 mg total) by mouth daily.   fluticasone (FLONASE) 50 MCG/ACT nasal spray USE 1-2 SPRAYS IN EACH NOSTRIL ONCE DAILY AS NEEDED   hydrocortisone cream (HYDROCORTISONE ANTI-ITCH) 1 % Apply 1 Application topically 2 (two) times daily as needed for itching.   nitroGLYCERIN (NITROSTAT) 0.4 MG SL tablet PLACE 1 TABLET UNDER THE TONGUE EVERY 5 MINUTES AS NEEDED FOR CHEST PAIN.   olopatadine (PATANOL) 0.1 % ophthalmic solution Place 1 drop into both eyes 2 (two) times daily as needed for allergies.   omeprazole (PRILOSEC) 20 MG capsule TAKE 1 CAPSULE BY MOUTH DAILY   ondansetron (ZOFRAN-ODT) 4 MG disintegrating tablet Take 4 mg by mouth 2 (two) times daily as needed for nausea/vomiting.   potassium chloride SA (KLOR-CON M) 20 MEQ tablet Take 1 tablet (20 mEq total) by mouth every other day.   PRESCRIPTION MEDICATION Take 1 tablet by mouth as needed (migraine). Nurtec - Samples from doctor   rOPINIRole (REQUIP) 1 MG tablet Take 1 mg by mouth at bedtime.   rosuvastatin (CRESTOR) 40 MG tablet Take 1 tablet (40 mg total) by mouth daily.   spironolactone (ALDACTONE) 25 MG tablet TAKE 0.5 TABLETS BY MOUTH AT BEDTIME.   SUBOXONE 8-2 MG FILM Place 1 strip under the tongue daily as needed (withdrawal).   topiramate (TOPAMAX) 50 MG tablet TAKE 1 TABLET BY MOUTH DAILY   TRELEGY ELLIPTA 100-62.5-25 MCG/ACT AEPB USE 1 INHALATION BY MOUTH DAILY   [DISCONTINUED] dapagliflozin propanediol (FARXIGA) 10 MG TABS tablet Take 1 tablet (10 mg total) by mouth daily.      EKGs/Labs/Other Studies Reviewed:    The following studies were reviewed today:  Cardiac Studies & Procedures   CARDIAC CATHETERIZATION  CARDIAC CATHETERIZATION 07/16/2022  Narrative   Previously placed Mid LAD stent of unknown type is  widely patent.   There is mild left ventricular systolic dysfunction.   LV end diastolic pressure is moderately elevated.   The left ventricular ejection fraction is 35-45% by visual estimate.  Patent mid  LAD stent without restenosis No obstructive disease in the Circumflex or RCA LVEDP 24 mmHg  Recommendations: medical management of CAD. Given elevated LV filling pressure, she may benefit from additional diuresis. Will give another dose of IV Lasix today. Echo pending.  Findings Coronary Findings Diagnostic  Dominance: Right  Left Anterior Descending Previously placed Mid LAD stent of unknown type is  widely patent.  Intervention  No interventions have been documented.   CARDIAC CATHETERIZATION  CARDIAC CATHETERIZATION 03/20/2021  Narrative   CULPRIT LESION: Prox LAD to Mid LAD lesion is 100% stenosed.   A drug-eluting stent was successfully placed using a SYNERGY XD 2.50X28.  Postdilated to 3.1 mm.   Post intervention, there is a 0% residual stenosis.   --------------------------------------   Otherwise angiographically normal RCA, RI and LCx   --------------------------------------   There is moderate left ventricular systolic dysfunction.  The left ventricular ejection fraction is 35-45% by visual estimate.   LV  end diastolic pressure is severely elevated.   There is no aortic valve stenosis.  SUMMARY Cardiac arrest with ventricular fibrillation and ROSC secondary to Anterior STEMI Severe single-vessel CAD with 100% thrombotic occlusion of the LAD just after D1 Successful DES PCI of LAD restoring TIMI-3 flow using Synergy XD DES 2.5 x 28 mm stent postdilated to 3.1 mm. Acute combined systolic and diastolic heart failure: EF estimated roughly 40% by LV gram with EDP of 28 to 30 mmHg.   RECOMMENDATIONS We will continue lidocaine and Aggrastat drip for 2 to 3 hours post PCI to allow for further stabilization Check 2D echocardiogram on March 2 to allow time for stabilization Given 4 mg IV Lasix post-cath-reassess Start low-dose beta-blocker-carvedilol 3.125 mg twice daily.  We will need to titrate as blood pressure tolerates and consider addition of ARB/ARNI and  spironolactone. Increase statin dose to 80 mg daily Smoking station counseling    Bryan Lemma, MD  Findings Coronary Findings Diagnostic  Dominance: Right  Left Anterior Descending Prox LAD to Mid LAD lesion is 100% stenosed. Vessel is the culprit lesion. The lesion is type A, located at the major branch, segmental, thrombotic and ulcerative.  First Diagonal Branch Vessel is small in size.  Second Diagonal Branch Vessel is small in size.  Second Septal Branch Vessel is small in size.  Third Diagonal Branch Vessel is small in size.  Ramus Intermedius Vessel is large.  Left Circumflex Vessel is moderate in size.  First Obtuse Marginal Branch Vessel is small in size.  Right Coronary Artery Vessel is large.  Acute Marginal Branch Vessel is small in size.  Right Ventricular Branch Vessel is small in size.  Right Posterior Descending Artery Vessel is moderate in size.  First Right Posterolateral Branch Vessel is small in size.  Second Right Posterolateral Branch Vessel is moderate in size.  Third Right Posterolateral Branch Vessel is small in size.  Intervention  Prox LAD to Mid LAD lesion Stent Lesion length:  26 mm. CATH VISTA GUIDE 6FR XBLAD3.5 guide catheter was inserted. Lesion crossed with guidewire using a WIRE ASAHI PROWATER 180CM. Pre-stent angioplasty was performed using a BALLN SAPPHIRE 2.0X12. Maximum pressure:  12 atm. Inflation time:  20 sec. A drug-eluting stent was successfully placed using a SYNERGY XD 2.50X28. Maximum pressure: 16 atm. Inflation time: 30 sec. Stent strut is well apposed. Postdilated to 3.1 mm Post-stent angioplasty was performed using a BALLN SAPPHIRE [place] 3.0X18. Maximum pressure:  18 atm. Inflation time:  20 sec. Post-Intervention Lesion Assessment The intervention was successful. Pre-interventional TIMI flow is 0. Post-intervention TIMI flow is 3. Treated lesion length:  28 mm. No complications occurred at this  lesion. There is a 0% residual stenosis post intervention.    ECHOCARDIOGRAM  ECHOCARDIOGRAM COMPLETE 07/16/2022  Narrative ECHOCARDIOGRAM REPORT    Patient Name:   Maria Ellis Wesby Date of Exam: 07/16/2022 Medical Rec #:  [id number]     Height:       63.0 in Accession #:    [id number]    Weight:       179.1 lb Date of Birth:  Sep 05, 1985     BSA:          1.845 m Patient Age:    37 years      BP:           107/61 mmHg Patient Gender: F             HR:  64 bpm. Exam Location:  Inpatient  Procedure: 2D Echo, Cardiac Doppler, Color Doppler and Intracardiac Opacification Agent  Indications:    I25.110 Atherosclerotic heart disease of native coronary artery with unstable angina pectoris  History:        Patient has prior history of Echocardiogram examinations, most recent 03/03/2022. Previous Myocardial Infarction and CAD, Abnormal ECG, Arrythmias:Cardiac Arrest and Ventricular Fibrillation, Signs/Symptoms:Chest Pain and Syncope; Risk Factors:Hypertension, Current Smoker and Dyslipidemia. VP shunt.  Sonographer:    Sheralyn Boatman RDCS Referring Phys: [id number] DENNIS NARCISSE JR   Sonographer Comments: Technically difficult study due to poor echo windows. Image acquisition challenging due to patient body habitus. IMPRESSIONS   1. Left ventricular ejection fraction, by estimation, is 40 to 45%. The left ventricle has mildly decreased function. The left ventricle demonstrates regional wall motion abnormalities (see scoring diagram/findings for description). Left ventricular diastolic parameters were normal. 2. Right ventricular systolic function is normal. The right ventricular size is normal. Tricuspid regurgitation signal is inadequate for assessing PA pressure. 3. The mitral valve is grossly normal. Trivial mitral valve regurgitation. No evidence of mitral stenosis. 4. The aortic valve was not well visualized. Aortic valve regurgitation is not visualized. No aortic stenosis is  present. 5. The inferior vena cava is normal in size with greater than 50% respiratory variability, suggesting right atrial pressure of 3 mmHg.  Comparison(s): No significant change from prior study.  Conclusion(s)/Recommendation(s): Findings consistent with ischemic cardiomyopathy.  FINDINGS Left Ventricle: Left ventricular ejection fraction, by estimation, is 40 to 45%. The left ventricle has mildly decreased function. The left ventricle demonstrates regional wall motion abnormalities. Definity contrast agent was given IV to delineate the left ventricular endocardial borders. The left ventricular internal cavity size was normal in size. There is no left ventricular hypertrophy. Left ventricular diastolic parameters were normal.   LV Wall Scoring: The mid inferoseptal segment, apical septal segment, apical inferior segment, and apex are hypokinetic.  Right Ventricle: The right ventricular size is normal. No increase in right ventricular wall thickness. Right ventricular systolic function is normal. Tricuspid regurgitation signal is inadequate for assessing PA pressure.  Left Atrium: Left atrial size was normal in size.  Right Atrium: Right atrial size was normal in size.  Pericardium: There is no evidence of pericardial effusion.  Mitral Valve: The mitral valve is grossly normal. Trivial mitral valve regurgitation. No evidence of mitral valve stenosis.  Tricuspid Valve: The tricuspid valve is grossly normal. Tricuspid valve regurgitation is trivial. No evidence of tricuspid stenosis.  Aortic Valve: The aortic valve was not well visualized. Aortic valve regurgitation is not visualized. No aortic stenosis is present.  Pulmonic Valve: The pulmonic valve was grossly normal. Pulmonic valve regurgitation is not visualized. No evidence of pulmonic stenosis.  Aorta: The aortic root and ascending aorta are structurally normal, with no evidence of dilitation.  Venous: The inferior vena cava  is normal in size with greater than 50% respiratory variability, suggesting right atrial pressure of 3 mmHg.  IAS/Shunts: The atrial septum is grossly normal.   LEFT VENTRICLE PLAX 2D LVIDd:         5.40 cm      Diastology LVIDs:         4.30 cm      LV e' medial:    9.57 cm/s LV PW:         0.90 cm      LV E/e' medial:  10.9 LV IVS:        0.70 cm  LV e' lateral:   12.60 cm/s LVOT diam:     2.00 cm      LV E/e' lateral: 8.3 LV SV:         79 LV SV Index:   43 LVOT Area:     3.14 cm  LV Volumes (MOD) LV vol d, MOD A2C: 73.8 ml LV vol d, MOD A4C: 139.0 ml LV vol s, MOD A2C: 42.0 ml LV vol s, MOD A4C: 76.6 ml LV SV MOD A2C:     31.8 ml LV SV MOD A4C:     139.0 ml LV SV MOD BP:      42.4 ml  RIGHT VENTRICLE             IVC RV S prime:     15.60 cm/s  IVC diam: 1.80 cm TAPSE (M-mode): 3.1 cm  LEFT ATRIUM             Index        RIGHT ATRIUM           Index LA diam:        2.50 cm 1.35 cm/m   RA Area:     11.90 cm LA Vol (A2C):   29.7 ml 16.10 ml/m  RA Volume:   25.10 ml  13.60 ml/m LA Vol (A4C):   37.0 ml 20.05 ml/m LA Biplane Vol: 34.2 ml 18.54 ml/m AORTIC VALVE LVOT Vmax:   132.00 cm/s LVOT Vmean:  84.000 cm/s LVOT VTI:    0.253 m  AORTA Ao Root diam: 3.00 cm Ao Asc diam:  2.60 cm  MITRAL VALVE MV Area (PHT): 4.79 cm     SHUNTS MV Decel Time: 159 msec     Systemic VTI:  0.25 m MV E velocity: 104.60 cm/s  Systemic Diam: 2.00 cm MV A velocity: 55.10 cm/s MV E/A ratio:  1.90  Lennie Odor MD Electronically signed by Lennie Odor MD Signature Date/Time: 07/16/2022/3:48:21 PM    Final   MONITORS  LONG TERM MONITOR-LIVE TELEMETRY (3-14 DAYS) 06/03/2021  Narrative Patch Wear Time:  13 days and 14 hours (2023-04-19T15:56:23-0400 to 2023-05-03T06:38:18-0400)  Patient had a min HR of 43 bpm, max HR of 120 bpm, and avg HR of 63 bpm. Predominant underlying rhythm was Sinus Rhythm.  Isolated SVEs were rare (<1.0%), SVE Couplets were rare (<1.0%), and no  SVE Triplets were present.  Isolated VEs were rare (<1.0%), VE Couplets were rare (<1.0%), and no VE Triplets were present. Ventricular Bigeminy and Trigeminy were present.  There were 2 triggered and 4 diary events, all SRTH 1 with a single PVC.               Recent Labs: 02/19/2022: NT-Pro BNP 121 07/15/2022: ALT 23 07/16/2022: B Natriuretic Peptide 48.0 07/17/2022: Hemoglobin 14.0; Platelets 234 08/07/2022: Magnesium 1.9; TSH 1.160 01/28/2023: BUN 10; Creatinine, Ser 0.67; Potassium 4.4; Sodium 142  Recent Lipid Panel    Component Value Date/Time   CHOL 103 08/07/2022 1034   TRIG 106 08/07/2022 1034   HDL 51 08/07/2022 1034   CHOLHDL 2.0 08/07/2022 1034   CHOLHDL 4.4 03/20/2021 0526   VLDL 7 03/20/2021 0526   LDLCALC 32 08/07/2022 1034    Physical Exam:    VS:  BP 96/66 Comment: right arm  Pulse 71   Ht 5\' 3"  (1.6 m)   Wt 169 lb 3.2 oz (76.7 kg)   SpO2 97%   BMI 29.97 kg/m     Wt Readings from Last 3 Encounters:  02/12/23 169 lb 3.2 oz (76.7 kg)  11/11/22 172 lb (78 kg)  09/03/22 172 lb 12.8 oz (78.4 kg)     GEN:  Well nourished, well developed in no acute distress HEENT: Normal NECK: No JVD; No carotid bruits LYMPHATICS: No lymphadenopathy CARDIAC: RRR, no murmurs, rubs, gallops RESPIRATORY:  Clear to auscultation without rales, wheezing or rhonchi  ABDOMEN: Soft, non-tender, non-distended MUSCULOSKELETAL:  No edema; No deformity  SKIN: Warm and dry NEUROLOGIC:  Alert and oriented x 3 PSYCHIATRIC:  Normal affect    Signed, Norman Herrlich, MD  02/12/2023 3:24 PM    [facility]

## 2023-02-12 ENCOUNTER — Ambulatory Visit: Payer: 59 | Attending: Cardiology | Admitting: Cardiology

## 2023-02-12 ENCOUNTER — Encounter: Payer: Self-pay | Admitting: Cardiology

## 2023-02-12 VITALS — BP 96/66 | HR 71 | Ht 63.0 in | Wt 169.2 lb

## 2023-02-12 DIAGNOSIS — I959 Hypotension, unspecified: Secondary | ICD-10-CM

## 2023-02-12 DIAGNOSIS — E7801 Familial hypercholesterolemia: Secondary | ICD-10-CM

## 2023-02-12 DIAGNOSIS — I5022 Chronic systolic (congestive) heart failure: Secondary | ICD-10-CM

## 2023-02-12 DIAGNOSIS — I25118 Atherosclerotic heart disease of native coronary artery with other forms of angina pectoris: Secondary | ICD-10-CM | POA: Diagnosis not present

## 2023-02-12 NOTE — Patient Instructions (Signed)
Medication Instructions:  Your physician has recommended you make the following change in your medication:   STOP: Marcelline Deist  *If you need a refill on your cardiac medications before your next appointment, please call your pharmacy*   Lab Work: Your physician recommends that you return for lab work in:   Labs today: CMP, TSH, Cortisol, CBC  If you have labs (blood work) drawn today and your tests are completely normal, you will receive your results only by: MyChart Message (if you have MyChart) OR A paper copy in the mail If you have any lab test that is abnormal or we need to change your treatment, we will call you to review the results.   Testing/Procedures: Your physician has requested that you have an echocardiogram. Echocardiography is a painless test that uses sound waves to create images of your heart. It provides your doctor with information about the size and shape of your heart and how well your heart's chambers and valves are working. This procedure takes approximately one hour. There are no restrictions for this procedure. Please do NOT wear cologne, perfume, aftershave, or lotions (deodorant is allowed). Please arrive 15 minutes prior to your appointment time.  Please note: We ask at that you not bring children with you during ultrasound (echo/ vascular) testing. Due to room size and safety concerns, children are not allowed in the ultrasound rooms during exams. Our front office staff cannot provide observation of children in our lobby area while testing is being conducted. An adult accompanying a patient to their appointment will only be allowed in the ultrasound room at the discretion of the ultrasound technician under special circumstances. We apologize for any inconvenience.    Follow-Up: At [facility], you and your health needs are our priority.  As part of our continuing mission to provide you with exceptional heart care, we have created designated Provider  Care Teams.  These Care Teams include your primary Cardiologist (physician) and Advanced Practice Providers (APPs -  Physician Assistants and Nurse Practitioners) who all work together to provide you with the care you need, when you need it.  We recommend signing up for the patient portal called "MyChart".  Sign up information is provided on this After Visit Summary.  MyChart is used to connect with patients for Virtual Visits (Telemedicine).  Patients are able to view lab/test results, encounter notes, upcoming appointments, etc.  Non-urgent messages can be sent to your provider as well.   To learn more about what you can do with MyChart, go to [email or web address].    Your next appointment:   6 month(s)  Provider:   Norman Herrlich, MD    Other Instructions If blood pressure remains less than 100 after 1 week contact Dr. Dulce Sellar

## 2023-02-13 DIAGNOSIS — J45909 Unspecified asthma, uncomplicated: Secondary | ICD-10-CM | POA: Diagnosis not present

## 2023-02-13 DIAGNOSIS — G4733 Obstructive sleep apnea (adult) (pediatric): Secondary | ICD-10-CM | POA: Diagnosis not present

## 2023-02-13 LAB — COMPREHENSIVE METABOLIC PANEL
ALT: 19 [IU]/L (ref 0–32)
AST: 18 [IU]/L (ref 0–40)
Albumin: 4.6 g/dL (ref 3.9–4.9)
Alkaline Phosphatase: 86 [IU]/L (ref 44–121)
BUN/Creatinine Ratio: 15 (ref 9–23)
BUN: 11 mg/dL (ref 6–20)
Bilirubin Total: 0.3 mg/dL (ref 0.0–1.2)
CO2: 20 mmol/L (ref 20–29)
Calcium: 9 mg/dL (ref 8.7–10.2)
Chloride: 107 mmol/L — ABNORMAL HIGH (ref 96–106)
Creatinine, Ser: 0.72 mg/dL (ref 0.57–1.00)
Globulin, Total: 1.9 g/dL (ref 1.5–4.5)
Glucose: 107 mg/dL — ABNORMAL HIGH (ref 70–99)
Potassium: 4 mmol/L (ref 3.5–5.2)
Sodium: 143 mmol/L (ref 134–144)
Total Protein: 6.5 g/dL (ref 6.0–8.5)
eGFR: 110 mL/min/{1.73_m2} (ref >59)

## 2023-02-13 LAB — TSH: TSH: 0.568 u[IU]/mL (ref 0.450–4.500)

## 2023-02-13 LAB — CBC
Hematocrit: 43.1 % (ref 34.0–46.6)
Hemoglobin: 14.5 g/dL (ref 11.1–15.9)
MCH: 28.8 pg (ref 26.6–33.0)
MCHC: 33.6 g/dL (ref 31.5–35.7)
MCV: 86 fL (ref 79–97)
Platelets: 265 10*3/uL (ref 150–450)
RBC: 5.03 x10E6/uL (ref 3.77–5.28)
RDW: 12.3 % (ref 11.7–15.4)
WBC: 7.5 10*3/uL (ref 3.4–10.8)

## 2023-02-13 LAB — CORTISOL: Cortisol: 6.2 ug/dL (ref 6.2–19.4)

## 2023-02-17 DIAGNOSIS — Z79899 Other long term (current) drug therapy: Secondary | ICD-10-CM | POA: Diagnosis not present

## 2023-02-19 DIAGNOSIS — H9213 Otorrhea, bilateral: Secondary | ICD-10-CM | POA: Diagnosis not present

## 2023-02-19 DIAGNOSIS — H6693 Otitis media, unspecified, bilateral: Secondary | ICD-10-CM | POA: Diagnosis not present

## 2023-02-22 ENCOUNTER — Emergency Department (HOSPITAL_BASED_OUTPATIENT_CLINIC_OR_DEPARTMENT_OTHER): Payer: 59

## 2023-02-22 ENCOUNTER — Encounter (HOSPITAL_BASED_OUTPATIENT_CLINIC_OR_DEPARTMENT_OTHER): Payer: Self-pay | Admitting: Emergency Medicine

## 2023-02-22 ENCOUNTER — Other Ambulatory Visit: Payer: Self-pay

## 2023-02-22 DIAGNOSIS — S46911A Strain of unspecified muscle, fascia and tendon at shoulder and upper arm level, right arm, initial encounter: Secondary | ICD-10-CM | POA: Diagnosis not present

## 2023-02-22 DIAGNOSIS — Y93E5 Activity, floor mopping and cleaning: Secondary | ICD-10-CM | POA: Insufficient documentation

## 2023-02-22 DIAGNOSIS — S46011A Strain of muscle(s) and tendon(s) of the rotator cuff of right shoulder, initial encounter: Secondary | ICD-10-CM | POA: Insufficient documentation

## 2023-02-22 DIAGNOSIS — X509XXA Other and unspecified overexertion or strenuous movements or postures, initial encounter: Secondary | ICD-10-CM | POA: Diagnosis not present

## 2023-02-22 DIAGNOSIS — Z7982 Long term (current) use of aspirin: Secondary | ICD-10-CM | POA: Insufficient documentation

## 2023-02-22 DIAGNOSIS — M25511 Pain in right shoulder: Secondary | ICD-10-CM | POA: Diagnosis not present

## 2023-02-22 NOTE — ED Triage Notes (Signed)
Pt c/o pain to RT shoulder since she heard a pop yesterday while washing the car

## 2023-02-23 ENCOUNTER — Emergency Department (HOSPITAL_BASED_OUTPATIENT_CLINIC_OR_DEPARTMENT_OTHER)
Admission: EM | Admit: 2023-02-23 | Discharge: 2023-02-23 | Disposition: A | Discharge status: Home / Self Care | Payer: 59 | Attending: Emergency Medicine | Admitting: Emergency Medicine

## 2023-02-23 DIAGNOSIS — S46011A Strain of muscle(s) and tendon(s) of the rotator cuff of right shoulder, initial encounter: Secondary | ICD-10-CM | POA: Diagnosis not present

## 2023-02-23 DIAGNOSIS — S46911A Strain of unspecified muscle, fascia and tendon at shoulder and upper arm level, right arm, initial encounter: Secondary | ICD-10-CM

## 2023-02-23 MED ORDER — TRAMADOL HCL 50 MG PO TABS: 50.0000 mg | ORAL_TABLET | Freq: Four times a day (QID) | ORAL | 0 refills | Status: DC | PRN

## 2023-02-23 MED ORDER — TRAMADOL HCL 50 MG PO TABS
50.0000 mg | ORAL_TABLET | Freq: Once | ORAL | Status: AC
  Administered 2023-02-23: 50 mg via ORAL
  Filled 2023-02-23: qty 1

## 2023-02-23 NOTE — ED Provider Notes (Signed)
[place] [facility] AT [facility] Provider Note   CSN: [id number] Arrival date & time: 02/22/23  2057     History  Chief Complaint  Patient presents with   Shoulder Pain    Maria Ellis is a 38 y.o. female.  Patient is a 38 year old female presenting with right shoulder pain.  She reports washing her car yesterday when she felt a pop in her right shoulder.  It has been hurting since.  She has pain with any range of motion.  She has been taking naproxen with little relief.  The history is provided by the patient.       Home Medications Prior to Admission medications   Medication Sig Start Date End Date Taking? Authorizing Provider  AIMOVIG 70 MG/ML [facility] THE CONTENTS OF 1 PEN  SUBCUTANEOUSLY ONCE MONTHLY 11/11/22   Crist Fat, MD  albuterol (VENTOLIN HFA) 108 (90 Base) MCG/ACT inhaler Inhale 2 puffs into the lungs every 4 (four) hours as needed for shortness of breath.    [provider]  aspirin 81 MG chewable tablet Chew 1 tablet (81 mg total) by mouth daily. 03/25/21   Azalee Course, PA  Cholecalciferol (VITAMIN D3) 1.25 MG (50000 UT) CAPS Take 1 capsule by mouth every 30 (thirty) days. 07/15/21   [provider]  citalopram (CELEXA) 40 MG tablet TAKE 1 TABLET BY MOUTH DAILY 09/09/22   Leonia Reader, Barbara Cower, MD  ezetimibe (ZETIA) 10 MG tablet Take 1 tablet (10 mg total) by mouth daily. 11/18/22   Baldo Daub, MD  fluticasone (FLONASE) 50 MCG/ACT nasal spray USE 1-2 SPRAYS IN EACH NOSTRIL ONCE DAILY AS NEEDED 11/24/22   Crist Fat, MD  furosemide (LASIX) 40 MG tablet Take 1 tablet (40 mg total) by mouth daily as needed (wt gain of 3 pds in 1 day or 5 pds in 1 week). 3 pds in 1 day or 5 pds in 1 week 11/11/22 02/09/23  Flossie Dibble, NP  hydrocortisone cream (HYDROCORTISONE ANTI-ITCH) 1 % Apply 1 Application topically 2 (two) times daily as needed for itching.    [provider]  nitroGLYCERIN (NITROSTAT) 0.4 MG SL tablet  PLACE 1 TABLET UNDER THE TONGUE EVERY 5 MINUTES AS NEEDED FOR CHEST PAIN. 08/18/22   Azalee Course, PA  olopatadine (PATANOL) 0.1 % ophthalmic solution Place 1 drop into both eyes 2 (two) times daily as needed for allergies.    [provider]  omeprazole (PRILOSEC) 20 MG capsule TAKE 1 CAPSULE BY MOUTH DAILY 09/09/22   Leonia Reader, Barbara Cower, MD  ondansetron (ZOFRAN-ODT) 4 MG disintegrating tablet Take 4 mg by mouth 2 (two) times daily as needed for nausea/vomiting. 02/24/21   [provider]  potassium chloride SA (KLOR-CON M) 20 MEQ tablet Take 1 tablet (20 mEq total) by mouth every other day. 11/26/22   Flossie Dibble, NP  PRESCRIPTION MEDICATION Take 1 tablet by mouth as needed (migraine). Nurtec - Samples from doctor    [provider]  rOPINIRole (REQUIP) 1 MG tablet Take 1 mg by mouth at bedtime.    [provider]  rosuvastatin (CRESTOR) 40 MG tablet Take 1 tablet (40 mg total) by mouth daily. 09/09/22   Baldo Daub, MD  spironolactone (ALDACTONE) 25 MG tablet TAKE 0.5 TABLETS BY MOUTH AT BEDTIME. 06/24/22   Munley, Iline Oven, MD  SUBOXONE 8-2 MG FILM Place 1 strip under the tongue daily as needed (withdrawal). 03/06/21   [provider]  topiramate (  TOPAMAX) 50 MG tablet TAKE 1 TABLET BY MOUTH DAILY 09/09/22   Leonia Reader, Barbara Cower, MD  TRELEGY ELLIPTA 100-62.5-25 MCG/ACT AEPB USE 1 INHALATION BY MOUTH DAILY 09/29/22   Crist Fat, MD      Allergies    Penicillins, Iodinated contrast media, Multihance [gadobenate], and Tylenol [acetaminophen]    Review of Systems   Review of Systems  All other systems reviewed and are negative.   Physical Exam Updated Vital Signs BP 104/69 (BP Location: Left Arm)   Pulse 64   Temp 99.9 F (37.7 C) (Oral)   Resp 20   Ht 5\' 3"  (1.6 m)   Wt 76.7 kg   LMP 02/15/2023   SpO2 98%   BMI 29.95 kg/m  Physical Exam Vitals and nursing note reviewed.  Constitutional:      Appearance: Normal appearance.  Pulmonary:      Effort: Pulmonary effort is normal.  Musculoskeletal:     Comments: The right shoulder is grossly normal in appearance.  She has pain with any range of motion.  Ulnar and radial pulses are palpable and motor and sensation are intact throughout the entire hand.  Skin:    General: Skin is warm and dry.  Neurological:     Mental Status: She is alert.     ED Results / Procedures / Treatments   Labs (all labs ordered are listed, but only abnormal results are displayed) Labs Reviewed - No data to display  EKG None  Radiology DG Shoulder Right Result Date: 02/22/2023 CLINICAL DATA:  Pain in the right shoulder after hearing a pop. EXAM: RIGHT SHOULDER - 2+ VIEW COMPARISON:  None Available. FINDINGS: There is no evidence of fracture or dislocation. There is no evidence of arthropathy or other focal bone abnormality. Soft tissues are unremarkable. IMPRESSION: Negative. Electronically Signed   By: Burman Nieves M.D.   On: 02/22/2023 21:30    Procedures Procedures    Medications Ordered in ED Medications  traMADol (ULTRAM) tablet 50 mg (has no administration in time range)    ED Course/ Medical Decision Making/ A&P  Patient presenting with right shoulder pain as described in the HPI.  Her x-rays are negative for fracture or dislocation.  This will be treated as a sprain/strain with continued NSAIDs, tramadol, and a shoulder sling.  To follow-up with primary doctor if not improving.  Final Clinical Impression(s) / ED Diagnoses Final diagnoses:  None    Rx / DC Orders ED Discharge Orders     None         Geoffery Lyons, MD 02/23/23 [phone]

## 2023-02-23 NOTE — Discharge Instructions (Addendum)
Continue taking naproxen 500 mg twice daily.  Begin taking tramadol as prescribed as needed for pain not relieved with naproxen.  Wear arm sling for comfort and support.  Follow-up with primary doctor if not improving in the next week.

## 2023-03-05 ENCOUNTER — Ambulatory Visit: Payer: 59

## 2023-03-08 ENCOUNTER — Other Ambulatory Visit: Payer: Self-pay | Admitting: Cardiology

## 2023-03-19 ENCOUNTER — Ambulatory Visit: Payer: 59 | Attending: Cardiology

## 2023-03-19 DIAGNOSIS — I5022 Chronic systolic (congestive) heart failure: Secondary | ICD-10-CM | POA: Diagnosis not present

## 2023-03-19 DIAGNOSIS — I25118 Atherosclerotic heart disease of native coronary artery with other forms of angina pectoris: Secondary | ICD-10-CM | POA: Diagnosis not present

## 2023-03-19 DIAGNOSIS — I959 Hypotension, unspecified: Secondary | ICD-10-CM

## 2023-03-19 DIAGNOSIS — E7801 Familial hypercholesterolemia: Secondary | ICD-10-CM

## 2023-03-19 LAB — ECHOCARDIOGRAM COMPLETE
Area-P 1/2: 4.29 cm2
S' Lateral: 4.1 cm

## 2023-03-19 MED ORDER — PERFLUTREN LIPID MICROSPHERE
1.0000 mL | INTRAVENOUS | Status: AC | PRN
Start: 2023-03-19 — End: 2023-03-19
  Administered 2023-03-19: 10 mL via INTRAVENOUS

## 2023-03-26 ENCOUNTER — Other Ambulatory Visit: Payer: 59

## 2023-04-01 DIAGNOSIS — H5213 Myopia, bilateral: Secondary | ICD-10-CM | POA: Diagnosis not present

## 2023-04-02 ENCOUNTER — Other Ambulatory Visit: Payer: Self-pay | Admitting: Cardiology

## 2023-04-09 DIAGNOSIS — Z79899 Other long term (current) drug therapy: Secondary | ICD-10-CM | POA: Diagnosis not present

## 2023-05-11 DIAGNOSIS — J45909 Unspecified asthma, uncomplicated: Secondary | ICD-10-CM | POA: Diagnosis not present

## 2023-05-11 DIAGNOSIS — G4733 Obstructive sleep apnea (adult) (pediatric): Secondary | ICD-10-CM | POA: Diagnosis not present

## 2023-05-22 ENCOUNTER — Other Ambulatory Visit: Payer: Self-pay | Admitting: Cardiology

## 2023-05-27 ENCOUNTER — Encounter (HOSPITAL_COMMUNITY): Payer: Self-pay

## 2023-05-27 ENCOUNTER — Other Ambulatory Visit: Payer: Self-pay

## 2023-05-27 ENCOUNTER — Emergency Department (HOSPITAL_COMMUNITY)
Admission: EM | Admit: 2023-05-27 | Discharge: 2023-05-28 | Disposition: A | Discharge status: Home / Self Care | Attending: Emergency Medicine | Admitting: Emergency Medicine

## 2023-05-27 DIAGNOSIS — R531 Weakness: Secondary | ICD-10-CM | POA: Diagnosis not present

## 2023-05-27 DIAGNOSIS — R110 Nausea: Secondary | ICD-10-CM | POA: Diagnosis not present

## 2023-05-27 DIAGNOSIS — W19XXXA Unspecified fall, initial encounter: Secondary | ICD-10-CM | POA: Diagnosis not present

## 2023-05-27 DIAGNOSIS — Z7982 Long term (current) use of aspirin: Secondary | ICD-10-CM | POA: Diagnosis not present

## 2023-05-27 DIAGNOSIS — I509 Heart failure, unspecified: Secondary | ICD-10-CM | POA: Diagnosis not present

## 2023-05-27 DIAGNOSIS — R112 Nausea with vomiting, unspecified: Secondary | ICD-10-CM | POA: Insufficient documentation

## 2023-05-27 LAB — CBC WITH DIFFERENTIAL/PLATELET
Abs Immature Granulocytes: 0.03 10*3/uL (ref 0.00–0.07)
Basophils Absolute: 0.1 10*3/uL (ref 0.0–0.1)
Basophils Relative: 1 %
Eosinophils Absolute: 0.1 10*3/uL (ref 0.0–0.5)
Eosinophils Relative: 1 %
HCT: 40.9 % (ref 36.0–46.0)
Hemoglobin: 13.9 g/dL (ref 12.0–15.0)
Immature Granulocytes: 0 %
Lymphocytes Relative: 20 %
Lymphs Abs: 1.8 10*3/uL (ref 0.7–4.0)
MCH: 28.6 pg (ref 26.0–34.0)
MCHC: 34 g/dL (ref 30.0–36.0)
MCV: 84.2 fL (ref 80.0–100.0)
Monocytes Absolute: 0.6 10*3/uL (ref 0.1–1.0)
Monocytes Relative: 6 %
Neutro Abs: 6.8 10*3/uL (ref 1.7–7.7)
Neutrophils Relative %: 72 %
Platelets: 263 10*3/uL (ref 150–400)
RBC: 4.86 MIL/uL (ref 3.87–5.11)
RDW: 12.5 % (ref 11.5–15.5)
WBC: 9.4 10*3/uL (ref 4.0–10.5)
nRBC: 0 % (ref 0.0–0.2)

## 2023-05-27 LAB — BASIC METABOLIC PANEL WITH GFR
Anion gap: 9 (ref 5–15)
BUN: 7 mg/dL (ref 6–20)
CO2: 19 mmol/L — ABNORMAL LOW (ref 22–32)
Calcium: 9.1 mg/dL (ref 8.9–10.3)
Chloride: 111 mmol/L (ref 98–111)
Creatinine, Ser: 0.58 mg/dL (ref 0.44–1.00)
GFR, Estimated: >60 mL/min (ref >60)
Glucose, Bld: 93 mg/dL (ref 70–99)
Potassium: 3.3 mmol/L — ABNORMAL LOW (ref 3.5–5.1)
Sodium: 139 mmol/L (ref 135–145)

## 2023-05-27 MED ORDER — ONDANSETRON 4 MG PO TBDP
8.0000 mg | ORAL_TABLET | Freq: Once | ORAL | Status: AC
  Administered 2023-05-27: 8 mg via ORAL
  Filled 2023-05-27: qty 2

## 2023-05-27 NOTE — ED Provider Notes (Signed)
 [place] EMERGENCY DEPARTMENT AT [facility] Provider Note   CSN: [id number] Arrival date & time: 05/27/23  2133     History  Chief Complaint  Patient presents with   Weakness   Nausea    Maria Ellis is a 38 y.o. female.  Patient presents to the emergency department via EMS complaining of generalized weakness and nausea.  She states that symptoms began around 730 this evening.  Patient endorses 1 episode of emesis at the time of EMS arrival.  She denies abdominal pain, chest pain, shortness of breath, urinary symptoms, vaginal discharge, vaginal bleeding.  Patient has a very vague description of symptoms just saying that she does not feel "great".  Past medical history significant for hydrocephalus, MI, CHF, spina bifida, cardiac arrest   Weakness      Home Medications Prior to Admission medications   Medication Sig Start Date End Date Taking? Authorizing Provider  AIMOVIG  70 MG/ML [facility] INJECT THE CONTENTS OF 1 PEN  SUBCUTANEOUSLY ONCE MONTHLY 11/11/22  Yes Wayne Haines, MD  albuterol  (VENTOLIN  HFA) 108 [phone] Base) MCG/ACT inhaler Inhale 2 puffs into the lungs every 4 (four) hours as needed for shortness of breath.   Yes [provider]  aspirin  81 MG chewable tablet Chew 1 tablet (81 mg total) by mouth daily. 03/25/21  Yes Meng, Hao, PA  Cholecalciferol (VITAMIN D3) 1.25 MG (50000 UT) CAPS Take 1 capsule by mouth every 30 (thirty) days. 07/15/21  Yes [provider]  ezetimibe  (ZETIA ) 10 MG tablet Take 1 tablet (10 mg total) by mouth daily. 04/03/23  Yes Hassan Links, MD  fluticasone  (FLONASE ) 50 MCG/ACT nasal spray USE 1-2 SPRAYS IN EACH NOSTRIL ONCE DAILY AS NEEDED 11/24/22  Yes Wayne Haines, MD  furosemide  (LASIX ) 40 MG tablet Take 1 tablet (40 mg total) by mouth daily as needed (wt gain of 3 pds in 1 day or 5 pds in 1 week). 3 pds in 1 day or 5 pds in 1 week 11/11/22 05/26/24 Yes Pattricia Bores C, NP  nitroGLYCERIN  (NITROSTAT ) 0.4 MG SL tablet  PLACE 1 TABLET UNDER THE TONGUE EVERY 5 MINUTES AS NEEDED FOR CHEST PAIN. 08/18/22  Yes Meng, Hao, PA  olopatadine (PATANOL) 0.1 % ophthalmic solution Place 1 drop into both eyes 2 (two) times daily as needed for allergies.   Yes [provider]  omeprazole  (PRILOSEC) 20 MG capsule TAKE 1 CAPSULE BY MOUTH DAILY 09/09/22  Yes Wayne Haines, MD  ondansetron  (ZOFRAN -ODT) 4 MG disintegrating tablet Take 4 mg by mouth 2 (two) times daily as needed for nausea/vomiting. 02/24/21  Yes [provider]  PRESCRIPTION MEDICATION Take 1 tablet by mouth as needed (migraine). Nurtec - Samples from doctor   Yes [provider]  rOPINIRole (REQUIP) 1 MG tablet Take 1 mg by mouth at bedtime.   Yes [provider]  rosuvastatin  (CRESTOR ) 40 MG tablet Take 1 tablet (40 mg total) by mouth daily. 09/09/22  Yes Hassan Links, MD  spironolactone  (ALDACTONE ) 25 MG tablet TAKE ONE-HALF TABLET BY MOUTH  DAILY 05/25/23  Yes Hassan Links, MD  SUBOXONE  8-2 MG FILM Place 1 strip under the tongue daily as needed (withdrawal). 03/06/21  Yes [provider]  topiramate  (TOPAMAX ) 50 MG tablet TAKE 1 TABLET BY MOUTH DAILY 09/09/22  Yes Wayne Haines, MD  TRELEGY ELLIPTA  100-62.5-25 MCG/ACT AEPB USE 1 INHALATION BY MOUTH DAILY 09/29/22  Yes Wayne Haines, MD  citalopram  (CELEXA ) 40 MG tablet  TAKE 1 TABLET BY MOUTH DAILY 09/09/22   Mitcheal Amy, Reymundo Caulk, MD  potassium chloride  SA (KLOR-CON  M) 20 MEQ tablet Take 1 tablet (20 mEq total) by mouth every other day. Patient not taking: Reported on 05/27/2023 11/26/22   Terrance Ferretti, NP  traMADol  (ULTRAM ) 50 MG tablet Take 1 tablet (50 mg total) by mouth every 6 (six) hours as needed. Patient not taking: Reported on 05/27/2023 02/23/23   Orvilla Blander, MD      Allergies    Penicillins, Iodinated contrast media, Multihance  [gadobenate], and Tylenol  [acetaminophen ]    Review of Systems   Review of Systems  Neurological:  Positive for weakness.    Physical  Exam Updated Vital Signs BP 99/60 (BP Location: Left Arm)   Pulse (!) 57   Temp 97.9 F (36.6 C)   Resp 18   Ht 5\' 3"  (1.6 m)   Wt 73.9 kg   SpO2 100%   BMI 28.87 kg/m  Physical Exam Vitals and nursing note reviewed.  Constitutional:      General: She is not in acute distress.    Appearance: She is well-developed.  HENT:     Head: Normocephalic and atraumatic.  Eyes:     Conjunctiva/sclera: Conjunctivae normal.  Cardiovascular:     Rate and Rhythm: Normal rate and regular rhythm.     Heart sounds: No murmur heard. Pulmonary:     Effort: Pulmonary effort is normal. No respiratory distress.     Breath sounds: Normal breath sounds.  Abdominal:     Palpations: Abdomen is soft.     Tenderness: There is no abdominal tenderness.  Musculoskeletal:        General: No swelling.     Cervical back: Neck supple.  Skin:    General: Skin is warm and dry.     Capillary Refill: Capillary refill takes less than 2 seconds.  Neurological:     Mental Status: She is alert.  Psychiatric:        Mood and Affect: Mood normal.     ED Results / Procedures / Treatments   Labs (all labs ordered are listed, but only abnormal results are displayed) Labs Reviewed  BASIC METABOLIC PANEL WITH GFR - Abnormal; Notable for the following components:      Result Value   Potassium 3.3 (*)    CO2 19 (*)    All other components within normal limits  URINALYSIS, ROUTINE W REFLEX MICROSCOPIC - Abnormal; Notable for the following components:   APPearance TURBID (*)    Glucose, UA >=500 (*)    Hgb urine dipstick MODERATE (*)    Ketones, ur 20 (*)    All other components within normal limits  CBC WITH DIFFERENTIAL/PLATELET    EKG EKG Interpretation Date/Time:  Wednesday May 27 2023 23:17:52 EDT Ventricular Rate:  57 PR Interval:  160 QRS Duration:  90 QT Interval:  476 QTC Calculation: 463 R Axis:   25  Text Interpretation: Sinus bradycardia Possible Anterior infarct , age undetermined  Abnormal ECG Confirmed by Eldon Greenland ([id number]) on 05/27/2023 11:24:16 PM  Radiology No results found.  Procedures Procedures    Medications Ordered in ED Medications  ondansetron  (ZOFRAN -ODT) disintegrating tablet 8 mg (8 mg Oral Given 05/27/23 2247)    ED Course/ Medical Decision Making/ A&P                                 Medical Decision Making  Amount and/or Complexity of Data Reviewed Labs: ordered.  Risk Prescription drug management.   This patient presents to the ED for concern of feeling general weakness and 1 episode of emesis, this involves an extensive number of treatment options, and is a complaint that carries with it a high risk of complications and morbidity.  The differential diagnosis includes viral illness, cholecystitis, appendicitis, others   Co morbidities that complicate the patient evaluation  History of cardiac arrest, hydrocephalus   Additional history obtained:  Additional history obtained from visitor at bedside, EMS External records from outside source obtained and reviewed including cardiology notes   Lab Tests:  I Ordered, and personally interpreted labs.  The pertinent results include: Mild hypokalemia with a potassium 3.3   Cardiac Monitoring: / EKG:  The patient was maintained on a cardiac monitor.  I personally viewed and interpreted the cardiac monitored which showed an underlying rhythm of: Sinus rhythm   Problem List / ED Course / Critical interventions / Medication management   I ordered medication including Zofran  for nausea Reevaluation of the patient after these medicines showed that the patient improved I have reviewed the patients home medicines and have made adjustments as needed  Social Determinants of Health:  Patient is a former smoker   Test / Admission - Considered:  Patient feeling somewhat better after Zofran .  She has tolerated oral intake and passed a p.o. challenge.  UA turbid with moderate hemoglobin  patient does endorse being on her menstrual cycle at this time.  Patient feeling better.  I see no indication for further emergent workup or admission at this time.  Plan to discharge home with strict return precautions including chest pain, shortness of breath, severe abdominal pain.  Patient may use her currently prescribed Zofran  and follow-up primary care for further evaluation as needed.         Final Clinical Impression(s) / ED Diagnoses Final diagnoses:  Nausea and vomiting, unspecified vomiting type  Weakness    Rx / DC Orders ED Discharge Orders     None         Delories Fetter 05/28/23 0145    Sallyanne Creamer, DO 06/03/23 1450

## 2023-05-27 NOTE — ED Provider Notes (Incomplete)
 [place] EMERGENCY DEPARTMENT AT [facility] Provider Note   CSN: [id number] Arrival date & time: 05/27/23  2133     History {Add pertinent medical, surgical, social history, OB history to HPI:1} Chief Complaint  Patient presents with  . Weakness  . Nausea    Maria Ellis is a 38 y.o. female.  Patient presents to the emergency department via EMS complaining of generalized weakness and nausea.  She states that symptoms began around 730 this evening.  Patient endorses 1 episode of emesis at the time of EMS arrival.  She denies abdominal pain, chest pain, shortness of breath, urinary symptoms, vaginal discharge, vaginal bleeding.  Patient has a very vague description of symptoms just saying that she does not feel "great".  Past medical history significant for hydrocephalus, MI, CHF, spina bifida, cardiac arrest   Weakness      Home Medications Prior to Admission medications   Medication Sig Start Date End Date Taking? Authorizing Provider  AIMOVIG  70 MG/ML [facility] INJECT THE CONTENTS OF 1 PEN  SUBCUTANEOUSLY ONCE MONTHLY 11/11/22  Yes Wayne Haines, MD  albuterol  (VENTOLIN  HFA) 108 [phone] Base) MCG/ACT inhaler Inhale 2 puffs into the lungs every 4 (four) hours as needed for shortness of breath.   Yes [provider]  aspirin  81 MG chewable tablet Chew 1 tablet (81 mg total) by mouth daily. 03/25/21  Yes Meng, Hao, PA  Cholecalciferol (VITAMIN D3) 1.25 MG (50000 UT) CAPS Take 1 capsule by mouth every 30 (thirty) days. 07/15/21  Yes [provider]  ezetimibe  (ZETIA ) 10 MG tablet Take 1 tablet (10 mg total) by mouth daily. 04/03/23  Yes Hassan Links, MD  fluticasone  (FLONASE ) 50 MCG/ACT nasal spray USE 1-2 SPRAYS IN EACH NOSTRIL ONCE DAILY AS NEEDED 11/24/22  Yes Wayne Haines, MD  furosemide  (LASIX ) 40 MG tablet Take 1 tablet (40 mg total) by mouth daily as needed (wt gain of 3 pds in 1 day or 5 pds in 1 week). 3 pds in 1 day or 5 pds in 1 week 11/11/22 05/26/24 Yes  Pattricia Bores C, NP  nitroGLYCERIN  (NITROSTAT ) 0.4 MG SL tablet PLACE 1 TABLET UNDER THE TONGUE EVERY 5 MINUTES AS NEEDED FOR CHEST PAIN. 08/18/22  Yes Meng, Hao, PA  olopatadine (PATANOL) 0.1 % ophthalmic solution Place 1 drop into both eyes 2 (two) times daily as needed for allergies.   Yes [provider]  omeprazole  (PRILOSEC) 20 MG capsule TAKE 1 CAPSULE BY MOUTH DAILY 09/09/22  Yes Wayne Haines, MD  ondansetron  (ZOFRAN -ODT) 4 MG disintegrating tablet Take 4 mg by mouth 2 (two) times daily as needed for nausea/vomiting. 02/24/21  Yes [provider]  PRESCRIPTION MEDICATION Take 1 tablet by mouth as needed (migraine). Nurtec - Samples from doctor   Yes [provider]  rOPINIRole (REQUIP) 1 MG tablet Take 1 mg by mouth at bedtime.   Yes [provider]  rosuvastatin  (CRESTOR ) 40 MG tablet Take 1 tablet (40 mg total) by mouth daily. 09/09/22  Yes Hassan Links, MD  spironolactone  (ALDACTONE ) 25 MG tablet TAKE ONE-HALF TABLET BY MOUTH  DAILY 05/25/23  Yes Hassan Links, MD  SUBOXONE  8-2 MG FILM Place 1 strip under the tongue daily as needed (withdrawal). 03/06/21  Yes [provider]  topiramate  (TOPAMAX ) 50 MG tablet TAKE 1 TABLET BY MOUTH DAILY 09/09/22  Yes Wayne Haines, MD  TRELEGY ELLIPTA  100-62.5-25 MCG/ACT AEPB USE 1 INHALATION BY MOUTH DAILY 09/29/22  Yes Carloyn Chi  Sarahann Cumins, MD  citalopram  (CELEXA ) 40 MG tablet TAKE 1 TABLET BY MOUTH DAILY 09/09/22   Mitcheal Amy, Reymundo Caulk, MD  potassium chloride  SA (KLOR-CON  M) 20 MEQ tablet Take 1 tablet (20 mEq total) by mouth every other day. Patient not taking: Reported on 05/27/2023 11/26/22   Terrance Ferretti, NP  traMADol  (ULTRAM ) 50 MG tablet Take 1 tablet (50 mg total) by mouth every 6 (six) hours as needed. Patient not taking: Reported on 05/27/2023 02/23/23   Orvilla Blander, MD      Allergies    Penicillins, Iodinated contrast media, Multihance  [gadobenate], and Tylenol  [acetaminophen ]    Review of Systems    Review of Systems  Neurological:  Positive for weakness.    Physical Exam Updated Vital Signs BP 125/73 (BP Location: Left Arm)   Pulse 68   Temp 98.8 F (37.1 C)   Resp 14   Ht 5\' 3"  (1.6 m)   Wt 73.9 kg   SpO2 100%   BMI 28.87 kg/m  Physical Exam Vitals and nursing note reviewed.  Constitutional:      General: She is not in acute distress.    Appearance: She is well-developed.  HENT:     Head: Normocephalic and atraumatic.  Eyes:     Conjunctiva/sclera: Conjunctivae normal.  Cardiovascular:     Rate and Rhythm: Normal rate and regular rhythm.     Heart sounds: No murmur heard. Pulmonary:     Effort: Pulmonary effort is normal. No respiratory distress.     Breath sounds: Normal breath sounds.  Abdominal:     Palpations: Abdomen is soft.     Tenderness: There is no abdominal tenderness.  Musculoskeletal:        General: No swelling.     Cervical back: Neck supple.  Skin:    General: Skin is warm and dry.     Capillary Refill: Capillary refill takes less than 2 seconds.  Neurological:     Mental Status: She is alert.  Psychiatric:        Mood and Affect: Mood normal.     ED Results / Procedures / Treatments   Labs (all labs ordered are listed, but only abnormal results are displayed) Labs Reviewed  BASIC METABOLIC PANEL WITH GFR  CBC WITH DIFFERENTIAL/PLATELET  URINALYSIS, ROUTINE W REFLEX MICROSCOPIC    EKG None  Radiology No results found.  Procedures Procedures  {Document cardiac monitor, telemetry assessment procedure when appropriate:1}  Medications Ordered in ED Medications  ondansetron  (ZOFRAN -ODT) disintegrating tablet 8 mg (has no administration in time range)    ED Course/ Medical Decision Making/ A&P   {   Click here for ABCD2, HEART and other calculatorsREFRESH Note before signing :1}                              Medical Decision Making Amount and/or Complexity of Data Reviewed Labs: ordered.  Risk Prescription drug  management.   ***  {Document critical care time when appropriate:1} {Document review of labs and clinical decision tools ie heart score, Chads2Vasc2 etc:1}  {Document your independent review of radiology images, and any outside records:1} {Document your discussion with family members, caretakers, and with consultants:1} {Document social determinants of health affecting pt's care:1} {Document your decision making why or why not admission, treatments were needed:1} Final Clinical Impression(s) / ED Diagnoses Final diagnoses:  None    Rx / DC Orders ED Discharge Orders     None

## 2023-05-27 NOTE — ED Triage Notes (Signed)
 Pt bib [facility] em[facility] c.o generalized weakness and nausea since around 730 tonight. Denies pain anywhere. VSS.

## 2023-05-28 DIAGNOSIS — R112 Nausea with vomiting, unspecified: Secondary | ICD-10-CM | POA: Diagnosis not present

## 2023-05-28 LAB — URINALYSIS, ROUTINE W REFLEX MICROSCOPIC
Bacteria, UA: NONE SEEN
Bilirubin Urine: NEGATIVE
Glucose, UA: >=500 mg/dL — AB
Ketones, ur: 20 mg/dL — AB
Leukocytes,Ua: NEGATIVE
Nitrite: NEGATIVE
Protein, ur: NEGATIVE mg/dL
Specific Gravity, Urine: 1.023 (ref 1.005–1.030)
pH: 7 (ref 5.0–8.0)

## 2023-05-28 NOTE — Discharge Instructions (Signed)
 Your workup tonight was reassuring.  You may use your currently prescribed Zofran  for nausea.  If you develop chest pain, severe abdominal pain, shortness of breath, or other life-threatening symptoms please return to the emergency department.

## 2023-06-02 DIAGNOSIS — H66003 Acute suppurative otitis media without spontaneous rupture of ear drum, bilateral: Secondary | ICD-10-CM | POA: Diagnosis not present

## 2023-06-02 DIAGNOSIS — J3089 Other allergic rhinitis: Secondary | ICD-10-CM | POA: Diagnosis not present

## 2023-06-03 DIAGNOSIS — Z79899 Other long term (current) drug therapy: Secondary | ICD-10-CM | POA: Diagnosis not present

## 2023-06-11 ENCOUNTER — Other Ambulatory Visit: Payer: Self-pay | Admitting: Internal Medicine

## 2023-06-11 ENCOUNTER — Other Ambulatory Visit: Payer: Self-pay | Admitting: Cardiology

## 2023-06-13 ENCOUNTER — Other Ambulatory Visit: Payer: Self-pay | Admitting: Internal Medicine

## 2023-06-27 ENCOUNTER — Other Ambulatory Visit: Payer: Self-pay | Admitting: Internal Medicine

## 2023-07-15 ENCOUNTER — Emergency Department (HOSPITAL_BASED_OUTPATIENT_CLINIC_OR_DEPARTMENT_OTHER)

## 2023-07-15 ENCOUNTER — Telehealth: Payer: Self-pay | Admitting: Cardiology

## 2023-07-15 ENCOUNTER — Emergency Department (HOSPITAL_BASED_OUTPATIENT_CLINIC_OR_DEPARTMENT_OTHER)
Admission: EM | Admit: 2023-07-15 | Discharge: 2023-07-15 | Disposition: A | Discharge status: Home / Self Care | Attending: Emergency Medicine | Admitting: Emergency Medicine

## 2023-07-15 ENCOUNTER — Other Ambulatory Visit: Payer: Self-pay

## 2023-07-15 ENCOUNTER — Encounter (HOSPITAL_BASED_OUTPATIENT_CLINIC_OR_DEPARTMENT_OTHER): Payer: Self-pay

## 2023-07-15 DIAGNOSIS — E860 Dehydration: Secondary | ICD-10-CM | POA: Insufficient documentation

## 2023-07-15 DIAGNOSIS — Z7982 Long term (current) use of aspirin: Secondary | ICD-10-CM | POA: Diagnosis not present

## 2023-07-15 DIAGNOSIS — Z4682 Encounter for fitting and adjustment of non-vascular catheter: Secondary | ICD-10-CM | POA: Diagnosis not present

## 2023-07-15 DIAGNOSIS — R55 Syncope and collapse: Secondary | ICD-10-CM | POA: Diagnosis not present

## 2023-07-15 DIAGNOSIS — R5383 Other fatigue: Secondary | ICD-10-CM | POA: Diagnosis present

## 2023-07-15 DIAGNOSIS — I509 Heart failure, unspecified: Secondary | ICD-10-CM | POA: Insufficient documentation

## 2023-07-15 DIAGNOSIS — I959 Hypotension, unspecified: Secondary | ICD-10-CM | POA: Diagnosis not present

## 2023-07-15 LAB — URINALYSIS, ROUTINE W REFLEX MICROSCOPIC
Bilirubin Urine: NEGATIVE
Glucose, UA: NEGATIVE mg/dL
Hgb urine dipstick: NEGATIVE
Ketones, ur: NEGATIVE mg/dL
Leukocytes,Ua: NEGATIVE
Nitrite: NEGATIVE
Protein, ur: NEGATIVE mg/dL
Specific Gravity, Urine: 1.015 (ref 1.005–1.030)
pH: 5.5 (ref 5.0–8.0)

## 2023-07-15 LAB — CBC
HCT: 37.4 % (ref 36.0–46.0)
Hemoglobin: 12.7 g/dL (ref 12.0–15.0)
MCH: 28.7 pg (ref 26.0–34.0)
MCHC: 34 g/dL (ref 30.0–36.0)
MCV: 84.6 fL (ref 80.0–100.0)
Platelets: 213 10*3/uL (ref 150–400)
RBC: 4.42 MIL/uL (ref 3.87–5.11)
RDW: 12 % (ref 11.5–15.5)
WBC: 6.2 10*3/uL (ref 4.0–10.5)
nRBC: 0 % (ref 0.0–0.2)

## 2023-07-15 LAB — COMPREHENSIVE METABOLIC PANEL WITH GFR
ALT: 18 U/L (ref 0–44)
AST: 23 U/L (ref 15–41)
Albumin: 4.3 g/dL (ref 3.5–5.0)
Alkaline Phosphatase: 71 U/L (ref 38–126)
Anion gap: 11 (ref 5–15)
BUN: 10 mg/dL (ref 6–20)
CO2: 24 mmol/L (ref 22–32)
Calcium: 9.1 mg/dL (ref 8.9–10.3)
Chloride: 103 mmol/L (ref 98–111)
Creatinine, Ser: 0.61 mg/dL (ref 0.44–1.00)
GFR, Estimated: >60 mL/min (ref >60)
Glucose, Bld: 87 mg/dL (ref 70–99)
Potassium: 3.7 mmol/L (ref 3.5–5.1)
Sodium: 138 mmol/L (ref 135–145)
Total Bilirubin: <0.2 mg/dL (ref 0.0–1.2)
Total Protein: 6.5 g/dL (ref 6.5–8.1)

## 2023-07-15 LAB — TROPONIN T, HIGH SENSITIVITY: Troponin T High Sensitivity: <15 ng/L (ref <19)

## 2023-07-15 LAB — PRO BRAIN NATRIURETIC PEPTIDE: Pro Brain Natriuretic Peptide: 200 pg/mL (ref <300.0)

## 2023-07-15 LAB — PREGNANCY, URINE: Preg Test, Ur: NEGATIVE

## 2023-07-15 MED ORDER — SODIUM CHLORIDE 0.9 % IV BOLUS
1000.0000 mL | Freq: Once | INTRAVENOUS | Status: AC
  Administered 2023-07-15: 1000 mL via INTRAVENOUS

## 2023-07-15 NOTE — Telephone Encounter (Signed)
 Pt states that she started feeling bad yesterday and her BP has been low and her heart rate has been 50-60's. Denies nausea/vomiting or diarrhea. Advised to go to the ED for evaluation as pt is not on BP medication. Pt is agreeable and verbalized understanding.

## 2023-07-15 NOTE — Discharge Instructions (Addendum)
 EKG, chest x-ray and screening laboratory evaluation was overall reassuring.  Your blood pressure is normally on the low end of normal but was mildly low today likely in the setting of mild dehydration.  It improved with a single IV fluid bolus.  Follow-up with your primary care provider, return for any severe worsening symptoms.

## 2023-07-15 NOTE — ED Triage Notes (Signed)
 Patient arrives POV with complaints of hypotension. Patient checks her BP daily (as instructed by her cardiologist) and noted her BP to be in the 80's systolic.  Reports some fatigue as well.

## 2023-07-15 NOTE — ED Provider Notes (Signed)
 [place] [facility] Provider Note   CSN: [id number] Arrival date & time: 07/15/23  1623     Patient presents with: Hypotension and Fatigue   FIDENCIA MCCLOUD is a 38 y.o. female.   HPI   38 year old female with medical history significant for STEMI and V-fib arrest, CHF last EF 45%, spina bifida with associated hydrocephalus, Chiari malformation status post VP shunt presenting to the emergency department with hypotension.  The patient states that she last took her spironolactone  2 nights ago.  She takes it every other day.  She takes Lasix  as needed for lower extremity swelling but has not had to take it in a week.  She denies any changes in bowel or bladder habits, no decreased p.o. intake.  She endorses increased lightheadedness upon positional changes like standing from a seated position.   Prior to Admission medications   Medication Sig Start Date End Date Taking? Authorizing Provider  AIMOVIG  70 MG/ML [employer] THE CONTENTS OF 1 PEN  SUBCUTANEOUSLY ONCE MONTHLY 11/11/22   Fleeta Valeria Mayo, MD  albuterol  (VENTOLIN  HFA) [phone] Base) MCG/ACT inhaler Inhale 2 puffs into the lungs every 4 (four) hours as needed for shortness of breath.    [provider]  aspirin  81 MG chewable tablet Chew 1 tablet (81 mg total) by mouth daily. 03/25/21   Meng, Hao, PA  Cholecalciferol (VITAMIN D3) 1.25 MG (50000 UT) CAPS Take 1 capsule by mouth every 30 (thirty) days. 07/15/21   [provider]  citalopram  (CELEXA ) 40 MG tablet TAKE 1 TABLET BY MOUTH DAILY 09/09/22   Fleeta Valeria, Mayo, MD  ezetimibe  (ZETIA ) 10 MG tablet Take 1 tablet (10 mg total) by mouth daily. 04/03/23   Monetta Redell PARAS, MD  fluticasone  (FLONASE ) 50 MCG/ACT nasal spray USE 1-2 SPRAYS IN EACH NOSTRIL ONCE DAILY AS NEEDED 11/24/22   Fleeta Valeria Mayo, MD  furosemide  (LASIX ) 40 MG tablet Take 1 tablet (40 mg total) by mouth daily as needed (wt gain of 3 pds in 1 day or 5 pds in 1 week). 3 pds in 1  day or 5 pds in 1 week 11/11/22 05/26/24  Carlin Delon BROCKS, NP  nitroGLYCERIN  (NITROSTAT ) 0.4 MG SL tablet PLACE 1 TABLET UNDER THE TONGUE EVERY 5 MINUTES AS NEEDED FOR CHEST PAIN. 08/18/22   Meng, Hao, PA  olopatadine (PATANOL) 0.1 % ophthalmic solution Place 1 drop into both eyes 2 (two) times daily as needed for allergies.    [provider]  omeprazole  (PRILOSEC) 20 MG capsule TAKE 1 CAPSULE BY MOUTH DAILY 09/09/22   Fleeta Valeria, Mayo, MD  ondansetron  (ZOFRAN -ODT) 4 MG disintegrating tablet Take 4 mg by mouth 2 (two) times daily as needed for nausea/vomiting. 02/24/21   [provider]  potassium chloride  SA (KLOR-CON  M) 20 MEQ tablet Take 1 tablet (20 mEq total) by mouth every other day. Patient not taking: Reported on 05/27/2023 11/26/22   Carlin Delon BROCKS, NP  PRESCRIPTION MEDICATION Take 1 tablet by mouth as needed (migraine). Nurtec - Samples from doctor    [provider]  rOPINIRole (REQUIP) 1 MG tablet Take 1 mg by mouth at bedtime.    [provider]  rosuvastatin  (CRESTOR ) 40 MG tablet TAKE 1 TABLET BY MOUTH DAILY 06/12/23   Monetta Redell PARAS, MD  spironolactone  (ALDACTONE ) 25 MG tablet TAKE ONE-HALF TABLET BY MOUTH  DAILY 05/25/23   Monetta Redell PARAS, MD  SUBOXONE  8-2 MG FILM Place 1 strip under the tongue  daily as needed (withdrawal). 03/06/21   [provider]  topiramate  (TOPAMAX ) 50 MG tablet TAKE 1 TABLET BY MOUTH DAILY 09/09/22   Fleeta Finger, Selinda, MD  traMADol  (ULTRAM ) 50 MG tablet Take 1 tablet (50 mg total) by mouth every 6 (six) hours as needed. Patient not taking: Reported on 05/27/2023 02/23/23   Geroldine Berg, MD  TRELEGY ELLIPTA  100-62.5-25 MCG/ACT AEPB USE 1 INHALATION BY MOUTH DAILY 09/29/22   Fleeta Finger Selinda, MD    Allergies: Penicillins, Iodinated contrast media, Multihance  [gadobenate], and Tylenol  [acetaminophen ]    Review of Systems  Neurological:  Positive for light-headedness.  All other systems reviewed and are negative.   Updated Vital  Signs BP (!) 101/58   Pulse (!) 58   Temp 98.4 F (36.9 C) (Oral)   Resp (!) 21   Ht 5' 3 (1.6 m)   Wt 74.4 kg   SpO2 97%   BMI 29.05 kg/m   Physical Exam Vitals and nursing note reviewed.  Constitutional:      General: She is not in acute distress.    Appearance: She is well-developed.  HENT:     Head: Normocephalic and atraumatic.     Mouth/Throat:     Mouth: Mucous membranes are dry.   Eyes:     Conjunctiva/sclera: Conjunctivae normal.    Cardiovascular:     Rate and Rhythm: Normal rate and regular rhythm.     Pulses: Normal pulses.  Pulmonary:     Effort: Pulmonary effort is normal. No respiratory distress.     Breath sounds: Normal breath sounds.  Abdominal:     Palpations: Abdomen is soft.     Tenderness: There is no abdominal tenderness.   Musculoskeletal:        General: No swelling.     Cervical back: Neck supple.     Right lower leg: No edema.     Left lower leg: No edema.   Skin:    General: Skin is warm and dry.     Capillary Refill: Capillary refill takes less than 2 seconds.   Neurological:     Mental Status: She is alert.   Psychiatric:        Mood and Affect: Mood normal.     (all labs ordered are listed, but only abnormal results are displayed) Labs Reviewed  COMPREHENSIVE METABOLIC PANEL WITH GFR  CBC  URINALYSIS, ROUTINE W REFLEX MICROSCOPIC  PREGNANCY, URINE  PRO BRAIN NATRIURETIC PEPTIDE  TROPONIN T, HIGH SENSITIVITY    EKG: EKG Interpretation Date/Time:  Wednesday July 15 2023 16:34:56 EDT Ventricular Rate:  61 PR Interval:  156 QRS Duration:  94 QT Interval:  397 QTC Calculation: 400 R Axis:   15  Text Interpretation: Sinus rhythm Low voltage, extremity leads Consider anterior infarct Confirmed by Jerrol Agent ([id number]) on 07/15/2023 5:08:20 PM  Radiology: DG Chest 2 View Result Date: 07/15/2023 CLINICAL DATA:  Syncope. EXAM: CHEST - 2 VIEW COMPARISON:  Chest radiograph dated 07/15/2022. FINDINGS: No focal  consolidation, pleural effusion or pneumothorax. The cardiac silhouette is within normal limits. No acute osseous pathology. Partially visualized shunt tube over the right chest. IMPRESSION: No active cardiopulmonary disease. Electronically Signed   By: Vanetta Chou M.D.   On: 07/15/2023 18:14     Procedures   Medications Ordered in the ED  sodium chloride  0.9 % bolus 1,000 mL (0 mLs Intravenous Stopped 07/15/23 1758)  Medical Decision Making Amount and/or Complexity of Data Reviewed Labs: ordered. Radiology: ordered.   38 year old female with medical history significant for STEMI and V-fib arrest, CHF last EF 45%, spina bifida with associated hydrocephalus, Chiari malformation status post VP shunt presenting to the emergency department with hypotension.  The patient states that she last took her spironolactone  2 nights ago.  She takes it every other day.  She takes Lasix  as needed for lower extremity swelling but has not had to take it in a week.  She denies any changes in bowel or bladder habits, no decreased p.o. intake.  She endorses increased lightheadedness upon positional changes like standing from a seated position.   On arrival, the patient was afebrile, not tachycardic or tachypneic, BP 88/58, saturating 100% on room air.  Physical exam revealed dry mucous membranes.  Orthostatics: Orthostatic Lying BP- Lying: 94/50 Pulse- Lying: 55 Orthostatic Sitting BP- Sitting: 93/48 Pulse- Sitting: 58 Orthostatic Standing at 0 minutes BP- Standing at 0 minutes: 91/45 Pulse- Standing at 0 minutes: 60 Orthostatic Standing at 3 minutes BP- Standing at 3 minutes: 92/54 Pulse- Standing at 3 minutes: 55  Orthostatic vitals were resulted negative for orthostatic hypotension.  Patient appears mildly dry with dry mucous membranes.  IV access was obtained and the patient was administered an IV fluid bolus.  She denies any chest pain or shortness of breath  and has no lower extremity swelling on exam.  On chart review, the patient frequently has lower normal blood pressures.  EKG: Sinus rhythm, ventricular rate 61, no acute ischemic changes noted, Q waves in the anterior leads  Chest x-ray revealed no active cardiopulmonary disease.  Labs: CMP, CBC, UA, urine pregnancy, BNP and cardiac troponin all negative.  On repeat evaluation, the patient's blood pressure had improved to 105/61 with a MAP of 79.  She is asymptomatic at this time.  Suspect likely mild dehydration resulting in hypovolemia and mild hypotension which has now resolved.  Given the patient's extensive medical history, considered admission for observation versus discharge and feel the patient is currently stable for discharge.  Provided return precautions, overall stable for discharge and outpatient follow-up.     Final diagnoses:  Dehydration  Hypotension, unspecified hypotension type    ED Discharge Orders     None          Jerrol Agent, MD 07/15/23 1850

## 2023-07-15 NOTE — Telephone Encounter (Signed)
 Pt c/o BP issue: STAT if pt c/o blurred vision, one-sided weakness or slurred speech  1. What are your last 5 BP readings? 80/56; 83/49; 89/51; 87/49; 85/52  2. Are you having any other symptoms (ex. Dizziness, headache, blurred vision, passed out)? Sluggish   3. What is your BP issue? Running low

## 2023-07-16 ENCOUNTER — Other Ambulatory Visit: Payer: Self-pay | Admitting: Cardiology

## 2023-07-23 ENCOUNTER — Other Ambulatory Visit: Payer: Self-pay

## 2023-07-23 ENCOUNTER — Emergency Department (HOSPITAL_BASED_OUTPATIENT_CLINIC_OR_DEPARTMENT_OTHER)
Admission: EM | Admit: 2023-07-23 | Discharge: 2023-07-24 | Disposition: A | Discharge status: Home / Self Care | Attending: Emergency Medicine | Admitting: Emergency Medicine

## 2023-07-23 ENCOUNTER — Encounter (HOSPITAL_BASED_OUTPATIENT_CLINIC_OR_DEPARTMENT_OTHER): Payer: Self-pay

## 2023-07-23 ENCOUNTER — Emergency Department (HOSPITAL_BASED_OUTPATIENT_CLINIC_OR_DEPARTMENT_OTHER)

## 2023-07-23 DIAGNOSIS — I2510 Atherosclerotic heart disease of native coronary artery without angina pectoris: Secondary | ICD-10-CM | POA: Insufficient documentation

## 2023-07-23 DIAGNOSIS — S0990XA Unspecified injury of head, initial encounter: Secondary | ICD-10-CM | POA: Diagnosis not present

## 2023-07-23 DIAGNOSIS — W01198A Fall on same level from slipping, tripping and stumbling with subsequent striking against other object, initial encounter: Secondary | ICD-10-CM | POA: Diagnosis not present

## 2023-07-23 DIAGNOSIS — E041 Nontoxic single thyroid nodule: Secondary | ICD-10-CM | POA: Diagnosis not present

## 2023-07-23 DIAGNOSIS — Z7982 Long term (current) use of aspirin: Secondary | ICD-10-CM | POA: Diagnosis not present

## 2023-07-23 DIAGNOSIS — S199XXA Unspecified injury of neck, initial encounter: Secondary | ICD-10-CM | POA: Diagnosis not present

## 2023-07-23 DIAGNOSIS — R0789 Other chest pain: Secondary | ICD-10-CM | POA: Diagnosis not present

## 2023-07-23 DIAGNOSIS — Z0389 Encounter for observation for other suspected diseases and conditions ruled out: Secondary | ICD-10-CM | POA: Diagnosis not present

## 2023-07-23 DIAGNOSIS — Y9339 Activity, other involving climbing, rappelling and jumping off: Secondary | ICD-10-CM | POA: Insufficient documentation

## 2023-07-23 DIAGNOSIS — S299XXA Unspecified injury of thorax, initial encounter: Secondary | ICD-10-CM | POA: Diagnosis not present

## 2023-07-23 DIAGNOSIS — W19XXXA Unspecified fall, initial encounter: Secondary | ICD-10-CM

## 2023-07-23 DIAGNOSIS — M47814 Spondylosis without myelopathy or radiculopathy, thoracic region: Secondary | ICD-10-CM | POA: Diagnosis not present

## 2023-07-23 NOTE — ED Triage Notes (Signed)
 Pt states she was locked out house and climbed thru her window and fell head 1st hitting the commode -LOC, alert and oriented C/o HA, rt. Shoulder/neck and back pain C-collar placed in triage

## 2023-07-23 NOTE — ED Provider Notes (Signed)
 [place] [facility] Provider Note   CSN: [id number] Arrival date & time: 07/23/23  2201     Patient presents with: Neck Pain   Maria Ellis is a 38 y.o. female.   Patient with a history of CAD with stents Vfib arrest, VP shunt, Bell's palsy presents with fall greater than 25 hours ago.  States she was climbing in her window after being blacked out.  She lost her balance and struck her head on the commode.  Also complains of pain to her right neck.  Has had constant headache and neck pain since.  Denies loss of consciousness.  Denies vomiting.  Denies blood thinner use.  She recalls entire incident.  She has right paraspinal cervical neck pain.  No new weakness, numbness or tingling.  Does have chronic facial droop at baseline from her Bell's palsy which is unchanged.  No chest pain.  No abdominal pain.  No midline neck or back pain.  No bowel or bladder incontinence.  The history is provided by the patient.  Neck Pain Associated symptoms: headaches   Associated symptoms: no chest pain, no fever and no weakness        Prior to Admission medications   Medication Sig Start Date End Date Taking? Authorizing Provider  AIMOVIG  70 MG/ML [facility] THE CONTENTS OF 1 PEN  SUBCUTANEOUSLY ONCE MONTHLY 11/11/22   Fleeta Valeria Mayo, MD  albuterol  (VENTOLIN  HFA) 108 [phone] Base) MCG/ACT inhaler Inhale 2 puffs into the lungs every 4 (four) hours as needed for shortness of breath.    [provider]  aspirin  81 MG chewable tablet Chew 1 tablet (81 mg total) by mouth daily. 03/25/21   Meng, Hao, PA  Cholecalciferol (VITAMIN D3) 1.25 MG (50000 UT) CAPS Take 1 capsule by mouth every 30 (thirty) days. 07/15/21   [provider]  citalopram  (CELEXA ) 40 MG tablet TAKE 1 TABLET BY MOUTH DAILY 09/09/22   Fleeta Valeria, Mayo, MD  ezetimibe  (ZETIA ) 10 MG tablet Take 1 tablet (10 mg total) by mouth daily. 04/03/23   Monetta Redell PARAS, MD  fluticasone  (FLONASE ) 50 MCG/ACT  nasal spray USE 1-2 SPRAYS IN EACH NOSTRIL ONCE DAILY AS NEEDED 11/24/22   Fleeta Valeria, Mayo, MD  furosemide  (LASIX ) 40 MG tablet TAKE 1 TABLET BY MOUTH DAILY AS  NEEDED FOR WEIGHT GAIN OF 3  POUNDS IN 1 DAY OR 5 POUNDS IN 1 WEEK 07/17/23   Carlin Nest C, NP  nitroGLYCERIN  (NITROSTAT ) 0.4 MG SL tablet PLACE 1 TABLET UNDER THE TONGUE EVERY 5 MINUTES AS NEEDED FOR CHEST PAIN. 08/18/22   Meng, Hao, PA  olopatadine (PATANOL) 0.1 % ophthalmic solution Place 1 drop into both eyes 2 (two) times daily as needed for allergies.    [provider]  omeprazole  (PRILOSEC) 20 MG capsule TAKE 1 CAPSULE BY MOUTH DAILY 09/09/22   Fleeta Valeria, Mayo, MD  ondansetron  (ZOFRAN -ODT) 4 MG disintegrating tablet Take 4 mg by mouth 2 (two) times daily as needed for nausea/vomiting. 02/24/21   [provider]  potassium chloride  SA (KLOR-CON  M) 20 MEQ tablet Take 1 tablet (20 mEq total) by mouth every other day. Patient not taking: Reported on 05/27/2023 11/26/22   Carlin Nest BROCKS, NP  PRESCRIPTION MEDICATION Take 1 tablet by mouth as needed (migraine). Nurtec - Samples from doctor    [provider]  rOPINIRole (REQUIP) 1 MG tablet Take 1 mg by mouth at bedtime.    [provider]  rosuvastatin  (  CRESTOR ) 40 MG tablet TAKE 1 TABLET BY MOUTH DAILY 06/12/23   Monetta Redell PARAS, MD  spironolactone  (ALDACTONE ) 25 MG tablet TAKE ONE-HALF TABLET BY MOUTH  DAILY 05/25/23   Monetta Redell PARAS, MD  SUBOXONE  8-2 MG FILM Place 1 strip under the tongue daily as needed (withdrawal). 03/06/21   [provider]  topiramate  (TOPAMAX ) 50 MG tablet TAKE 1 TABLET BY MOUTH DAILY 09/09/22   Fleeta Finger, Selinda, MD  traMADol  (ULTRAM ) 50 MG tablet Take 1 tablet (50 mg total) by mouth every 6 (six) hours as needed. Patient not taking: Reported on 05/27/2023 02/23/23   Geroldine Berg, MD  TRELEGY ELLIPTA  100-62.5-25 MCG/ACT AEPB USE 1 INHALATION BY MOUTH DAILY 09/29/22   Fleeta Finger, Selinda, MD    Allergies: Penicillins, Iodinated contrast  media, Multihance  [gadobenate], and Tylenol  [acetaminophen ]    Review of Systems  Constitutional:  Negative for activity change, appetite change and fever.  HENT:  Negative for congestion and rhinorrhea.   Respiratory:  Negative for cough, chest tightness and shortness of breath.   Cardiovascular:  Negative for chest pain.  Gastrointestinal:  Negative for abdominal pain, nausea and vomiting.  Genitourinary:  Negative for dysuria and pelvic pain.  Musculoskeletal:  Positive for neck pain.  Skin:  Negative for rash.  Neurological:  Positive for headaches. Negative for weakness.   all other systems are negative except as noted in the HPI and PMH.    Updated Vital Signs BP 97/67 (BP Location: Right Arm)   Pulse 69   Resp 20   Ht 5' 3 (1.6 m)   Wt 75.3 kg   LMP 07/11/2023 (Exact Date)   SpO2 99%   BMI 29.41 kg/m   Physical Exam Vitals and nursing note reviewed.  Constitutional:      General: She is not in acute distress.    Appearance: She is well-developed.  HENT:     Head: Normocephalic and atraumatic.     Mouth/Throat:     Pharynx: No oropharyngeal exudate.  Eyes:     Conjunctiva/sclera: Conjunctivae normal.     Pupils: Pupils are equal, round, and reactive to light.  Neck:     Comments: Paraspinal cervical tenderness, no midline tenderness No Pain to palpation along course of catheter Cardiovascular:     Rate and Rhythm: Normal rate and regular rhythm.     Heart sounds: Normal heart sounds. No murmur heard. Pulmonary:     Effort: Pulmonary effort is normal. No respiratory distress.     Breath sounds: Normal breath sounds.  Abdominal:     Palpations: Abdomen is soft.     Tenderness: There is no abdominal tenderness. There is no guarding or rebound.  Musculoskeletal:        General: Tenderness present. Normal range of motion.     Cervical back: Normal range of motion and neck supple.     Comments: Mild tenderness throughout thoracic spine.  No step-offs.  Equal grip  strength bilaterally  Skin:    General: Skin is warm.  Neurological:     Mental Status: She is alert and oriented to person, place, and time.     Cranial Nerves: No cranial nerve deficit.     Motor: No abnormal muscle tone.     Coordination: Coordination normal.     Comments:  5/5 strength throughout. CN 2-12 intact.Equal grip strength.  Right-sided facial droop at baseline per patient.  Psychiatric:        Behavior: Behavior normal.     (all  labs ordered are listed, but only abnormal results are displayed) Labs Reviewed  BASIC METABOLIC PANEL WITH GFR - Abnormal; Notable for the following components:      Result Value   Calcium  8.8 (*)    All other components within normal limits  PREGNANCY, URINE  CBC WITH DIFFERENTIAL/PLATELET  TROPONIN T, HIGH SENSITIVITY  TROPONIN T, HIGH SENSITIVITY    EKG: None  Radiology: DG Abd 1 View Result Date: 07/24/2023 CLINICAL DATA:  Evaluation for shunt malfunction EXAM: ABDOMEN - 1 VIEW COMPARISON:  None Available. FINDINGS: The bowel gas pattern is normal. Large colonic stool burden. No radio-opaque calculi or other significant radiographic abnormality are seen. There is a catheter partially visualized in the mid upper abdomen however this is not well evaluated on the given radiographs. On prior chest radiographs the catheter in the low chest and upper abdomen is not well visualized. IMPRESSION: 1. Large colonic stool burden. 2. Catheter partially visualized in the mid upper abdomen however this is not well evaluated on the given radiographs. On prior chest radiographs the catheter in the low chest and upper abdomen is not well visualized. Electronically Signed   By: Norman Gatlin M.D.   On: 07/24/2023 02:27   DG Thoracic Spine 2 View Result Date: 07/24/2023 CLINICAL DATA:  Status post trauma. EXAM: THORACIC SPINE 2 VIEWS COMPARISON:  None Available. FINDINGS: There is no evidence of thoracic spine fracture. Alignment is normal. Mild endplate  sclerosis and osteophyte formation are seen within the midthoracic spine. No other significant bone abnormalities are identified. IMPRESSION: Mild degenerative changes in the midthoracic spine. Electronically Signed   By: Suzen Dials M.D.   On: 07/24/2023 01:21   DG Chest 2 View Result Date: 07/24/2023 CLINICAL DATA:  Status post trauma. EXAM: CHEST - 2 VIEW COMPARISON:  July 15, 2023 FINDINGS: The heart size and mediastinal contours are within normal limits. Both lungs are clear. The visualized skeletal structures are unremarkable. IMPRESSION: No active cardiopulmonary disease. Electronically Signed   By: Suzen Dials M.D.   On: 07/24/2023 01:20   CT Cervical Spine Wo Contrast Result Date: 07/23/2023 CLINICAL DATA:  Status post trauma. EXAM: CT CERVICAL SPINE WITHOUT CONTRAST TECHNIQUE: Multidetector CT imaging of the cervical spine was performed without intravenous contrast. Multiplanar CT image reconstructions were also generated. RADIATION DOSE REDUCTION: This exam was performed according to the departmental dose-optimization program which includes automated exposure control, adjustment of the mA and/or kV according to patient size and/or use of iterative reconstruction technique. COMPARISON:  None Available. FINDINGS: Alignment: There is mild reversal of the normal cervical spine lordosis. Skull base and vertebrae: No acute fracture. No primary bone lesion or focal pathologic process. Soft tissues and spinal canal: No prevertebral fluid or swelling. No visible canal hematoma. Disc levels: Normal multilevel endplates are seen with normal multilevel intervertebral disc spaces. Normal, bilateral multilevel facet joints are noted. Upper chest: Negative. Other: A 2.3 cm x 1.5 cm exophytic thyroid  nodule is seen along the lower pole of the right lobe of the thyroid  gland. IMPRESSION: 1. No acute fracture or subluxation in the cervical spine. 2. 2.3 cm x 1.5 cm exophytic thyroid  nodule along the lower  pole of the right lobe of the thyroid  gland. Correlation with nonemergent thyroid  ultrasound is recommended. Electronically Signed   By: Suzen Dials M.D.   On: 07/23/2023 23:45   CT Head Wo Contrast Result Date: 07/23/2023 CLINICAL DATA:  Status post trauma. EXAM: CT HEAD WITHOUT CONTRAST TECHNIQUE: Contiguous axial images were obtained  from the base of the skull through the vertex without intravenous contrast. RADIATION DOSE REDUCTION: This exam was performed according to the departmental dose-optimization program which includes automated exposure control, adjustment of the mA and/or kV according to patient size and/or use of iterative reconstruction technique. COMPARISON:  None Available. FINDINGS: Brain: No evidence of acute infarction, hemorrhage, hydrocephalus, extra-axial collection or mass lesion/mass effect. Stable right parieto-occipital ventricular catheter shunt positioning is noted with its tip crossing the midline in the region of the foramina Lynn. Vascular: No hyperdense vessel or unexpected calcification. Skull: A suboccipital craniotomy defect is seen on the right. Sinuses/Orbits: No acute finding. Other: None. IMPRESSION: 1. No acute intracranial abnormality. 2. Stable right parieto-occipital ventricular catheter shunt positioning. Electronically Signed   By: Suzen Dials M.D.   On: 07/23/2023 23:39     Procedures   Medications Ordered in the ED - No data to display                                  Medical Decision Making Amount and/or Complexity of Data Reviewed Labs: ordered. Decision-making details documented in ED Course. Radiology: ordered and independent interpretation performed. Decision-making details documented in ED Course. ECG/medicine tests: ordered and independent interpretation performed. Decision-making details documented in ED Course.  Risk Prescription drug management.   Fall with head injury and neck pain.  No loss of consciousness.  Complains  of right paraspinal neck pain.  No vomiting.  No blood thinner use.  No chest pain or shortness of breath.  She is neuro intact with her chronic right-sided facial droop.  Obtain imaging given the presence of her VP shunt catheter and right sided neck pain.  CT head and C-spine negative for acute traumatic injury.  Does have thyroid  nodule. VP shunt catheter appears to be intact.  Equal strength, chronic R facial droop.  EKG sinus rhythm.  No acute ST changes.  Labs reassuring.  Troponin negative.  Low concern for ACS.  Full range of motion of neck after anti-inflammatories.  Low concern for vascular source of pain.  Will treat supportively likely musculoskeletal pain.  Low suspicion for vascular etiology of pain.  She cannot take Tylenol  or NSAIDs.  Will give topical Lidoderm  patch and course of muscle relaxers.  Return to the ED with new or worsening symptoms peer     Final diagnoses:  Fall, initial encounter  Injury of head, initial encounter  Thyroid  nodule    ED Discharge Orders     None          Carianna Lague, Garnette, MD 07/24/23 [phone]

## 2023-07-24 ENCOUNTER — Emergency Department (HOSPITAL_BASED_OUTPATIENT_CLINIC_OR_DEPARTMENT_OTHER)

## 2023-07-24 ENCOUNTER — Other Ambulatory Visit: Payer: Self-pay

## 2023-07-24 DIAGNOSIS — Z0389 Encounter for observation for other suspected diseases and conditions ruled out: Secondary | ICD-10-CM | POA: Diagnosis not present

## 2023-07-24 DIAGNOSIS — S299XXA Unspecified injury of thorax, initial encounter: Secondary | ICD-10-CM | POA: Diagnosis not present

## 2023-07-24 DIAGNOSIS — S0990XA Unspecified injury of head, initial encounter: Secondary | ICD-10-CM | POA: Diagnosis not present

## 2023-07-24 DIAGNOSIS — M47814 Spondylosis without myelopathy or radiculopathy, thoracic region: Secondary | ICD-10-CM | POA: Diagnosis not present

## 2023-07-24 LAB — BASIC METABOLIC PANEL WITH GFR
Anion gap: 11 (ref 5–15)
BUN: 9 mg/dL (ref 6–20)
CO2: 24 mmol/L (ref 22–32)
Calcium: 8.8 mg/dL — ABNORMAL LOW (ref 8.9–10.3)
Chloride: 106 mmol/L (ref 98–111)
Creatinine, Ser: 0.69 mg/dL (ref 0.44–1.00)
GFR, Estimated: >60 mL/min (ref >60)
Glucose, Bld: 84 mg/dL (ref 70–99)
Potassium: 3.5 mmol/L (ref 3.5–5.1)
Sodium: 141 mmol/L (ref 135–145)

## 2023-07-24 LAB — CBC WITH DIFFERENTIAL/PLATELET
Abs Immature Granulocytes: 0.01 K/uL (ref 0.00–0.07)
Basophils Absolute: 0 K/uL (ref 0.0–0.1)
Basophils Relative: 1 %
Eosinophils Absolute: 0.3 K/uL (ref 0.0–0.5)
Eosinophils Relative: 4 %
HCT: 36.7 % (ref 36.0–46.0)
Hemoglobin: 12.5 g/dL (ref 12.0–15.0)
Immature Granulocytes: 0 %
Lymphocytes Relative: 38 %
Lymphs Abs: 3.3 K/uL (ref 0.7–4.0)
MCH: 28.5 pg (ref 26.0–34.0)
MCHC: 34.1 g/dL (ref 30.0–36.0)
MCV: 83.6 fL (ref 80.0–100.0)
Monocytes Absolute: 0.7 K/uL (ref 0.1–1.0)
Monocytes Relative: 8 %
Neutro Abs: 4.2 K/uL (ref 1.7–7.7)
Neutrophils Relative %: 49 %
Platelets: 238 K/uL (ref 150–400)
RBC: 4.39 MIL/uL (ref 3.87–5.11)
RDW: 12.3 % (ref 11.5–15.5)
WBC: 8.5 K/uL (ref 4.0–10.5)
nRBC: 0 % (ref 0.0–0.2)

## 2023-07-24 LAB — TROPONIN T, HIGH SENSITIVITY: Troponin T High Sensitivity: <15 ng/L (ref <19)

## 2023-07-24 LAB — PREGNANCY, URINE: Preg Test, Ur: NEGATIVE

## 2023-07-24 MED ORDER — KETOROLAC TROMETHAMINE 30 MG/ML IJ SOLN
15.0000 mg | Freq: Once | INTRAMUSCULAR | Status: AC
  Administered 2023-07-24: 15 mg via INTRAMUSCULAR
  Filled 2023-07-24: qty 1

## 2023-07-24 MED ORDER — LIDOCAINE 5 % EX PTCH: 1.0000 | MEDICATED_PATCH | CUTANEOUS | 0 refills | Status: DC

## 2023-07-24 MED ORDER — METHOCARBAMOL 500 MG PO TABS
500.0000 mg | ORAL_TABLET | Freq: Once | ORAL | Status: AC
  Administered 2023-07-24: 500 mg via ORAL
  Filled 2023-07-24: qty 1

## 2023-07-24 MED ORDER — METHOCARBAMOL 500 MG PO TABS: 500.0000 mg | ORAL_TABLET | Freq: Three times a day (TID) | ORAL | 0 refills | Status: DC | PRN

## 2023-07-24 MED ORDER — LIDOCAINE 5 % EX PTCH: 1.0000 | MEDICATED_PATCH | CUTANEOUS | Status: DC

## 2023-07-24 MED ORDER — OXYCODONE HCL 5 MG PO TABS
5.0000 mg | ORAL_TABLET | Freq: Once | ORAL | Status: AC
  Administered 2023-07-24: 5 mg via ORAL
  Filled 2023-07-24: qty 1

## 2023-07-24 NOTE — ED Notes (Signed)
 Pt back from xray, making multiple request for PO fluids.  Pt informed of pending test results needed first

## 2023-07-24 NOTE — ED Notes (Signed)
 PT back to Xray.

## 2023-07-24 NOTE — ED Notes (Signed)
 Assumed care of pt.  RN found her alert and oriented sitting in the chair.  Pain continues to be 7 but managing.  No complaints at this time.

## 2023-07-24 NOTE — ED Notes (Signed)
 Patient transported to X-ray

## 2023-07-24 NOTE — Discharge Instructions (Addendum)
  Your testing is reassuring.  No evidence of bleeding in the brain or fracture.  Your shunt appears to be intact.  Use the Lidoderm  patch as needed for pain and take the muscle relaxers as prescribed.  Follow-up with your doctor. Follow-up with your doctor for an ultrasound of her thyroid  nodule Return to the ED with chest pain, shortness of breath, weakness, numbness, tingling, other concerns.

## 2023-07-28 DIAGNOSIS — I2510 Atherosclerotic heart disease of native coronary artery without angina pectoris: Secondary | ICD-10-CM | POA: Diagnosis not present

## 2023-07-28 DIAGNOSIS — E041 Nontoxic single thyroid nodule: Secondary | ICD-10-CM | POA: Diagnosis not present

## 2023-07-28 DIAGNOSIS — M542 Cervicalgia: Secondary | ICD-10-CM | POA: Diagnosis not present

## 2023-07-28 DIAGNOSIS — Z9622 Myringotomy tube(s) status: Secondary | ICD-10-CM | POA: Diagnosis not present

## 2023-07-28 DIAGNOSIS — I255 Ischemic cardiomyopathy: Secondary | ICD-10-CM | POA: Diagnosis not present

## 2023-07-31 DIAGNOSIS — Z79899 Other long term (current) drug therapy: Secondary | ICD-10-CM | POA: Diagnosis not present

## 2023-07-31 DIAGNOSIS — M542 Cervicalgia: Secondary | ICD-10-CM | POA: Diagnosis not present

## 2023-07-31 DIAGNOSIS — M25511 Pain in right shoulder: Secondary | ICD-10-CM | POA: Diagnosis not present

## 2023-07-31 DIAGNOSIS — M129 Arthropathy, unspecified: Secondary | ICD-10-CM | POA: Diagnosis not present

## 2023-08-03 DIAGNOSIS — J45909 Unspecified asthma, uncomplicated: Secondary | ICD-10-CM | POA: Diagnosis not present

## 2023-08-03 DIAGNOSIS — G4733 Obstructive sleep apnea (adult) (pediatric): Secondary | ICD-10-CM | POA: Diagnosis not present

## 2023-08-06 DIAGNOSIS — E041 Nontoxic single thyroid nodule: Secondary | ICD-10-CM | POA: Diagnosis not present

## 2023-08-07 DIAGNOSIS — M25511 Pain in right shoulder: Secondary | ICD-10-CM | POA: Diagnosis not present

## 2023-08-07 DIAGNOSIS — M542 Cervicalgia: Secondary | ICD-10-CM | POA: Diagnosis not present

## 2023-08-07 DIAGNOSIS — G4700 Insomnia, unspecified: Secondary | ICD-10-CM | POA: Diagnosis not present

## 2023-08-07 DIAGNOSIS — R7401 Elevation of levels of liver transaminase levels: Secondary | ICD-10-CM | POA: Diagnosis not present

## 2023-08-07 DIAGNOSIS — Z79899 Other long term (current) drug therapy: Secondary | ICD-10-CM | POA: Diagnosis not present

## 2023-08-07 DIAGNOSIS — E041 Nontoxic single thyroid nodule: Secondary | ICD-10-CM | POA: Diagnosis not present

## 2023-08-07 DIAGNOSIS — R030 Elevated blood-pressure reading, without diagnosis of hypertension: Secondary | ICD-10-CM | POA: Diagnosis not present

## 2023-08-09 ENCOUNTER — Other Ambulatory Visit: Payer: Self-pay | Admitting: Internal Medicine

## 2023-08-10 ENCOUNTER — Encounter: Payer: Self-pay | Admitting: Internal Medicine

## 2023-08-10 ENCOUNTER — Other Ambulatory Visit: Payer: Self-pay | Admitting: Internal Medicine

## 2023-08-10 ENCOUNTER — Ambulatory Visit: Admitting: Internal Medicine

## 2023-08-10 DIAGNOSIS — G43109 Migraine with aura, not intractable, without status migrainosus: Secondary | ICD-10-CM | POA: Insufficient documentation

## 2023-08-10 DIAGNOSIS — G2581 Restless legs syndrome: Secondary | ICD-10-CM | POA: Insufficient documentation

## 2023-08-10 MED ORDER — TOPIRAMATE 50 MG PO TABS: 50.0000 mg | ORAL_TABLET | Freq: Every day | ORAL | 1 refills | Status: DC

## 2023-08-10 MED ORDER — ROPINIROLE HCL 1 MG PO TABS: 1.0000 mg | ORAL_TABLET | Freq: Every day | ORAL | 1 refills | Status: DC

## 2023-08-10 MED ORDER — OMEPRAZOLE 20 MG PO CPDR: 20.0000 mg | DELAYED_RELEASE_CAPSULE | Freq: Every day | ORAL | 3 refills | Status: DC

## 2023-08-10 NOTE — Assessment & Plan Note (Signed)
 Her migraines are stable. I gave her some samples of nurtec and we will continue on aimovig  and topamax .

## 2023-08-10 NOTE — Progress Notes (Signed)
 Office Visit  Subjective   Patient ID: Maria Ellis   DOB: 1985-04-12   Age: 38 y.o.   MRN: [id number]   Chief Complaint Chief Complaint  Patient presents with   Follow-up     History of Present Illness The patient is currently being followed by [facility] where she stopped suboxone  use about 2 weeks ago.  She injured herself on 07/14/2023 by falling head first thru a window.  They started her on oxycodone  and [facility] is managing her meds at this time.    The patient has a history of migraine disorder which she states began around the age of 89.  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  As an infant she had a ventriculoperioneal (VP) shunt placed.  She also had a chiari malformation decompression surgery in 2014.  She states that her migraines are located either frontal or occipital.  These occurring as a sharp stabbing pain that can last more than 24 hours.  She states she is having about 4-5 migraines per month.  She does get a visual aura before her migraines occur.  She has tried alleve in the past at the onset of the aura and sometimes this helps.  She used to be on rizatriptan  but I stopped her off this due to her recent STEMI/CAD.  I did give her samples of nurtec for abortive therapy and this does help.  The patient is on aimovig  and Topamax  50mg  daily which is controlling her migraines where she used to have this 8-10 times per month.  She does get nausea/vomiting and photophonophobia when she has migraines.  The patient has had Bell's Palsy with 8 episodes in the last 13 years.  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  She cannot lift her right eye brow.  The patient also has restless leg syndrome since she had her MI.  She is currently on ropinrole 1mg  at bedtime which controls her legs.          Past Medical History Past Medical History:  Diagnosis Date    Acute appendicitis 05/18/2013   Acute on chronic systolic heart failure (HCC) 07/17/2022   Anterior ST segment elevation (HCC) 03/20/2021   Bell's palsy    Cardiac arrest with ventricular fibrillation (HCC) 03/20/2021   Chest pain 07/15/2022   Depression with anxiety 04/06/2017   Elevated troponin 03/20/2021   Headache    High cholesterol    History of Chiari malformation    Hydrocephalus (HCC)    Hypotension 08/25/2021   Left-sided Bell's palsy 08/21/2016   Myocardial infarct [facility])    Near syncope    Right-sided chest pain 03/20/2021   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   Multihance  [Gadobenate] Nausea Only and Cough    PT HAD SNEEZING AND NAUSEA IMMEDIATELY AFTER CONTRAST INJECTION.     Tylenol  [Acetaminophen ] Rash and Other (See Comments)    Stomach cramping     Medications  Current Outpatient Medications:  AIMOVIG  70 MG/ML SOAJ, INJECT THE CONTENTS OF 1 PEN  SUBCUTANEOUSLY ONCE MONTHLY, Disp: 2 mL, Rfl: 5   albuterol  (VENTOLIN  HFA) 108 (90 Base) MCG/ACT inhaler, Inhale 2 puffs into the lungs every 4 (four) hours as needed for shortness of breath., Disp: , Rfl:    aspirin  81 MG chewable tablet, Chew 1 tablet (81 mg total) by mouth daily., Disp: , Rfl:    Cholecalciferol (VITAMIN D3) 1.25 MG (50000 UT) CAPS, Take 1 capsule by mouth every 30 (thirty) days., Disp: , Rfl:    citalopram  (CELEXA ) 40 MG tablet, TAKE 1 TABLET BY MOUTH DAILY, Disp: 90 tablet, Rfl: 3   ezetimibe  (ZETIA ) 10 MG tablet, Take 1 tablet (10 mg total) by mouth daily., Disp: 90 tablet, Rfl: 2   fluticasone  (FLONASE ) 50 MCG/ACT nasal  spray, USE 1-2 SPRAYS IN EACH NOSTRIL ONCE DAILY AS NEEDED, Disp: 48 mL, Rfl: 3   furosemide  (LASIX ) 40 MG tablet, TAKE 1 TABLET BY MOUTH DAILY AS  NEEDED FOR WEIGHT GAIN OF 3  POUNDS IN 1 DAY OR 5 POUNDS IN 1 WEEK, Disp: 100 tablet, Rfl: 3   lidocaine  (LIDODERM ) 5 %, Place 1 patch onto the skin daily. Remove & Discard patch within 12 hours or as directed by MD, Disp: 30 patch, Rfl: 0   methocarbamol  (ROBAXIN ) 500 MG tablet, Take 1 tablet (500 mg total) by mouth every 8 (eight) hours as needed for muscle spasms., Disp: 20 tablet, Rfl: 0   nitroGLYCERIN  (NITROSTAT ) 0.4 MG SL tablet, PLACE 1 TABLET UNDER THE TONGUE EVERY 5 MINUTES AS NEEDED FOR CHEST PAIN., Disp: 25 tablet, Rfl: 3   olopatadine (PATANOL) 0.1 % ophthalmic solution, Place 1 drop into both eyes 2 (two) times daily as needed for allergies., Disp: , Rfl:    omeprazole  (PRILOSEC) 20 MG capsule, TAKE 1 CAPSULE BY MOUTH DAILY, Disp: 90 capsule, Rfl: 3   ondansetron  (ZOFRAN -ODT) 4 MG disintegrating tablet, Take 4 mg by mouth 2 (two) times daily as needed for nausea/vomiting., Disp: , Rfl:    potassium chloride  SA (KLOR-CON  M) 20 MEQ tablet, Take 1 tablet (20 mEq total) by mouth every other day. (Patient not taking: Reported on 05/27/2023), Disp: 45 tablet, Rfl: 2   PRESCRIPTION MEDICATION, Take 1 tablet by mouth as needed (migraine). Nurtec - Samples from doctor, Disp: , Rfl:    rOPINIRole  (REQUIP ) 1 MG tablet, Take 1 mg by mouth at bedtime., Disp: , Rfl:    rosuvastatin  (CRESTOR ) 40 MG tablet, TAKE 1 TABLET BY MOUTH DAILY, Disp: 100 tablet, Rfl: 2   spironolactone  (ALDACTONE ) 25 MG tablet, TAKE ONE-HALF TABLET BY MOUTH  DAILY, Disp: 45 tablet, Rfl: 2   topiramate  (TOPAMAX ) 50 MG tablet, TAKE 1 TABLET BY MOUTH DAILY, Disp: 90 tablet, Rfl: 3   TRELEGY ELLIPTA  100-62.5-25 MCG/ACT AEPB, USE 1 INHALATION BY MOUTH DAILY, Disp: 60 each, Rfl: 11   Review of Systems Review of Systems  Constitutional:  Negative for chills and fever.  Eyes:  Negative  for blurred vision.  Respiratory:  Negative for cough and shortness of breath.   Cardiovascular:  Negative for chest pain, palpitations and leg swelling.  Gastrointestinal:  Negative for abdominal pain, constipation, diarrhea, nausea and vomiting.  Neurological:  Negative for dizziness, weakness and headaches.       Objective:    Vitals LMP 07/11/2023 (Exact Date)    Physical Examination Physical Exam Constitutional:      Appearance: Normal appearance. She is not ill-appearing.  Cardiovascular:     Rate  and Rhythm: Normal rate and regular rhythm.     Pulses: Normal pulses.     Heart sounds: No murmur heard.    No friction rub. No gallop.  Pulmonary:     Effort: Pulmonary effort is normal. No respiratory distress.     Breath sounds: No wheezing, rhonchi or rales.  Abdominal:     General: Bowel sounds are normal. There is no distension.     Palpations: Abdomen is soft.     Tenderness: There is no abdominal tenderness.  Musculoskeletal:     Right lower leg: No edema.     Left lower leg: No edema.  Skin:    General: Skin is warm and dry.     Findings: No rash.  Neurological:     Mental Status: She is alert.        Assessment & Plan:   Migraine with aura and without status migrainosus, not intractable Her migraines are stable. I gave her some samples of nurtec and we will continue on aimovig  and topamax .  Restless leg syndrome We will refill her ropinrole today.    Return in about 3 months (around 11/10/2023) for annual.   Selinda Fleeta Finger, MD

## 2023-08-10 NOTE — Assessment & Plan Note (Signed)
 We will refill her ropinrole today.

## 2023-08-14 DIAGNOSIS — G8929 Other chronic pain: Secondary | ICD-10-CM | POA: Diagnosis not present

## 2023-08-14 DIAGNOSIS — M542 Cervicalgia: Secondary | ICD-10-CM | POA: Diagnosis not present

## 2023-08-14 DIAGNOSIS — R030 Elevated blood-pressure reading, without diagnosis of hypertension: Secondary | ICD-10-CM | POA: Diagnosis not present

## 2023-08-14 DIAGNOSIS — Z79899 Other long term (current) drug therapy: Secondary | ICD-10-CM | POA: Diagnosis not present

## 2023-08-19 DIAGNOSIS — R7401 Elevation of levels of liver transaminase levels: Secondary | ICD-10-CM | POA: Diagnosis not present

## 2023-08-25 ENCOUNTER — Other Ambulatory Visit (HOSPITAL_COMMUNITY): Payer: Self-pay | Admitting: Internal Medicine

## 2023-08-28 DIAGNOSIS — R030 Elevated blood-pressure reading, without diagnosis of hypertension: Secondary | ICD-10-CM | POA: Diagnosis not present

## 2023-08-28 DIAGNOSIS — G8929 Other chronic pain: Secondary | ICD-10-CM | POA: Diagnosis not present

## 2023-08-28 DIAGNOSIS — M542 Cervicalgia: Secondary | ICD-10-CM | POA: Diagnosis not present

## 2023-08-28 DIAGNOSIS — Z79899 Other long term (current) drug therapy: Secondary | ICD-10-CM | POA: Diagnosis not present

## 2023-09-02 DIAGNOSIS — Z79899 Other long term (current) drug therapy: Secondary | ICD-10-CM | POA: Diagnosis not present

## 2023-09-04 ENCOUNTER — Other Ambulatory Visit: Payer: Self-pay | Admitting: Internal Medicine

## 2023-09-04 DIAGNOSIS — H66002 Acute suppurative otitis media without spontaneous rupture of ear drum, left ear: Secondary | ICD-10-CM | POA: Diagnosis not present

## 2023-09-04 NOTE — Progress Notes (Signed)
 [facility] [place] Patient Name:  Maria Ellis (DOB: 04/24/1985) MRN:  [id number] Appt Date:  09/04/2023    CHIEF COMPLAINT   Earache (L sided earache with drainage & popping, warm to touch. Pt used leftover steroid eardrops from previous visit along with old antibiotic pills (clindamycin ). No improvement. Has been using drops & antibiotics for 4-5 days. Pt recent;ly had neck pain, just finished a round of steroids for nodule in thyroid  that will be biopsied soon.)   ASSESSMENT AND PLAN   Assessment/Plan Diagnoses and all orders for this visit: 1. Non-recurrent acute suppurative otitis media of left ear without spontaneous rupture of tympanic membrane -     cefdinir (OMNICEF) 300 mg capsule; Take 1 capsule (300 mg total) by mouth in the morning and 1 capsule (300 mg total) before bedtime. Do all this for 10 days.  Dispense: 20 capsule; Refill: 0 -     ofloxacin (FLOXIN) 0.3 % otic solution; Administer 4 drops into affected ear(s) in the morning and 4 drops before bedtime. Do all this for 7 days.  Dispense: 10 mL; Refill: 0  MDM Number of Diagnoses or Management Options Diagnosis management comments: History and physical examination consistent with otitis externa and possible otitis media. We will treat with topical ear drops and oral antibiotics.  As an concern left TM to may not be draining effectively.  No signs of mastoiditis on exam and patient is afebrile.  Advised patient to stop using current ear drops and antibiotic clindamycin  as we will start her today on cefdinir and a different ear drops.  Encourage patient to contact ENT for follow-up to ensure that TM tubes are functioning effectively and it InCase symptoms are persistent/refractory to treatment started today.  Reviewed medications prescribed, OTC meds that may be augmented, expected course of recovery, and signs/symptoms that warrant further evaluation and or Emergency Room. Pt in agreement with plan of care at  this time and denies further questions or concerns.      HISTORY OF PRESENT ILLNESS   Subjective  38 year old female patient presents to the urgent care due to complaints of left sided earache with drainage & popping sensation at times.  She feels is the ear has been warm to the touch.  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  She states that her symptoms have not improved.  She denies fevers, chills or body aches.  She denies pain at the base of her skull.   Earache  Associated symptoms include ear discharge. Pertinent negatives include no rash, rhinorrhea, sore throat or vomiting.   Allergies  Allergen Reactions  . Amoxicillin-Pot Clavulanate Swelling     Instead of taking the infection away, it made it swell up even more.    SABRA Penicillins Swelling    Has patient had a PCN reaction causing immediate rash, facial/tongue/throat swelling, SOB or lightheadedness with hypotension: No Has patient had a PCN reaction causing severe rash involving mucus membranes or skin necrosis: Yes Has patient had a PCN reaction that required hospitalization No Has patient had a PCN reaction occurring within the last 10 years: Yes If all of the above answers are NO, then may proceed with Cephalosporin use.   . Gadobenate Dimeglumine  Nausea Only and Other (see comments)    Other reaction(s): Cough PT HAD SNEEZING AND NAUSEA IMMEDIATELY AFTER CONTRAST INJECTION PT HAD SNEEZING AND  NAUSEA IMMEDIATELY AFTER CONTRAST INJECTION   . Iodinated Contrast Media Itching    Pt states it made her feel like she was on fire and has sneezing symptoms  . Acetaminophen  Rash    Other reaction(s): Other (See Comments) Stomach cramping     REVIEW OF SYSTEMS   Review of Systems  Constitutional:  Negative for chills, fatigue and fever.  HENT:  Positive for ear discharge  and ear pain. Negative for congestion, facial swelling, postnasal drip, rhinorrhea, sore throat and trouble swallowing.   Gastrointestinal:  Negative for nausea and vomiting.  Musculoskeletal:  Negative for arthralgias, myalgias and neck stiffness.  Skin:  Negative for color change, rash and wound.  Neurological:  Negative for dizziness and light-headedness.    PHYSICAL EXAMINATION   Objective Vitals:   09/04/23 1252  BP: 92/66  BP Location: Right arm  Patient Position: Sitting  Pulse: 70  Resp: 18  Temp: 36.4 C (97.5 F)  TempSrc: Skin  SpO2: 98%  Weight: 168 lb 12.8 oz (76.6 kg)  Height: 5' 3 (1.6 m)  PainSc:   7  PainLoc: Ear   Physical Exam Constitutional:      General: She is not in acute distress.    Appearance: Normal appearance. She is normal weight. She is not ill-appearing or toxic-appearing.  HENT:     Right Ear: Ear canal and external ear normal. No swelling or tenderness. There is no impacted cerumen. No mastoid tenderness. A PE tube is present. No hemotympanum. Tympanic membrane is not injected, scarred, erythematous or bulging.     Left Ear: Ear canal and external ear normal. Drainage (yellow/white purulent appearing.) present. No swelling or tenderness.  No middle ear effusion. There is no impacted cerumen. No mastoid tenderness. A PE tube is present. No hemotympanum. Tympanic membrane is not injected, scarred, erythematous or bulging.     Nose: No congestion or rhinorrhea.     Mouth/Throat:     Mouth: Mucous membranes are moist.     Pharynx: Oropharynx is clear. No oropharyngeal exudate or posterior oropharyngeal erythema.  Cardiovascular:     Rate and Rhythm: Normal rate and regular rhythm.     Heart sounds: Normal heart sounds.  Pulmonary:     Effort: Pulmonary effort is normal.     Breath sounds: Normal breath sounds.  Musculoskeletal:     Cervical back: Neck supple.  Lymphadenopathy:     Cervical: No cervical adenopathy.  Neurological:     Mental  Status: She is alert.        Gladys JONETTA Poli, [place]

## 2023-09-06 ENCOUNTER — Other Ambulatory Visit: Payer: Self-pay | Admitting: Internal Medicine

## 2023-09-08 DIAGNOSIS — E041 Nontoxic single thyroid nodule: Secondary | ICD-10-CM | POA: Diagnosis not present

## 2023-09-11 DIAGNOSIS — Z79899 Other long term (current) drug therapy: Secondary | ICD-10-CM | POA: Diagnosis not present

## 2023-09-11 DIAGNOSIS — M542 Cervicalgia: Secondary | ICD-10-CM | POA: Diagnosis not present

## 2023-09-11 DIAGNOSIS — G8929 Other chronic pain: Secondary | ICD-10-CM | POA: Diagnosis not present

## 2023-09-17 DIAGNOSIS — Z9622 Myringotomy tube(s) status: Secondary | ICD-10-CM | POA: Diagnosis not present

## 2023-09-17 DIAGNOSIS — E041 Nontoxic single thyroid nodule: Secondary | ICD-10-CM | POA: Diagnosis not present

## 2023-09-27 ENCOUNTER — Other Ambulatory Visit: Payer: Self-pay | Admitting: Internal Medicine

## 2023-09-30 ENCOUNTER — Other Ambulatory Visit: Payer: Self-pay | Admitting: Physician Assistant

## 2023-10-14 DIAGNOSIS — I219 Acute myocardial infarction, unspecified: Secondary | ICD-10-CM | POA: Diagnosis not present

## 2023-10-14 DIAGNOSIS — G894 Chronic pain syndrome: Secondary | ICD-10-CM | POA: Diagnosis not present

## 2023-10-14 DIAGNOSIS — M129 Arthropathy, unspecified: Secondary | ICD-10-CM | POA: Diagnosis not present

## 2023-10-14 DIAGNOSIS — E7800 Pure hypercholesterolemia, unspecified: Secondary | ICD-10-CM | POA: Diagnosis not present

## 2023-10-14 DIAGNOSIS — Z79891 Long term (current) use of opiate analgesic: Secondary | ICD-10-CM | POA: Diagnosis not present

## 2023-10-14 DIAGNOSIS — M542 Cervicalgia: Secondary | ICD-10-CM | POA: Diagnosis not present

## 2023-10-14 DIAGNOSIS — G4700 Insomnia, unspecified: Secondary | ICD-10-CM | POA: Diagnosis not present

## 2023-11-03 ENCOUNTER — Encounter: Payer: Self-pay | Admitting: Internal Medicine

## 2023-11-03 ENCOUNTER — Ambulatory Visit: Admitting: Internal Medicine

## 2023-11-03 VITALS — BP 112/70 | HR 74 | Temp 97.5°F | Resp 16 | Ht 63.0 in | Wt 168.4 lb

## 2023-11-03 DIAGNOSIS — G919 Hydrocephalus, unspecified: Secondary | ICD-10-CM | POA: Diagnosis not present

## 2023-11-03 DIAGNOSIS — Z8669 Personal history of other diseases of the nervous system and sense organs: Secondary | ICD-10-CM | POA: Insufficient documentation

## 2023-11-03 NOTE — Progress Notes (Signed)
 Office Visit  Subjective   Patient ID: Maria Ellis   DOB: 1985/09/02   Age: 38 y.o.   MRN: [id number]   Chief Complaint Chief Complaint  Patient presents with   Headache    Pt in today for headaches, the patient reports she wants a Neurology referral due to thinking she has a problem with her shunt, states her headaches are in the back of her head. Pt reports dizziness and light headedness.      History of Present Illness Maria Ellis is a 38 years old left-handed female who presents today with complaints of headache, dizziness, nausea and right sided neck pain and decreased appetite over the last 5 days.  There has been no fevers, seiziures, irritability/personality change, vision problmes or other problems.  She wonders if she has a problem with her VP shunt and comes in today for evaluation of this.  She has a history of hydrocephalus since she was born, had her first VP shunt placement when she was 60 months old. At age 48, she developed sleepiness, frequent headaches, nausea and vomiting, was diagnosed with VP shunt blockage, had replacement when she was 38 years old, she has been doing well since then.  In 2014, she developed frequent headaches ago, right arm numbness tingling weakness where she was diagnosed with Arnold-Chiari malformation and had decompression surgery at [facility], which has helped her symptoms, but only partially.  She has not had a MRI or CT scan of her brain in years.   She continues with right facial weakness due to Bell's palsy.       Past Medical History Past Medical History:  Diagnosis Date   Acute appendicitis 05/18/2013   Acute on chronic systolic heart failure (HCC) 07/17/2022   Anterior ST segment elevation (HCC) 03/20/2021   Bell's palsy    Cardiac arrest with ventricular fibrillation (HCC) 03/20/2021   Chest pain 07/15/2022   Depression with anxiety 04/06/2017   Elevated troponin 03/20/2021   Headache    High cholesterol    History of  Chiari malformation    Hydrocephalus (HCC)    Hypotension 08/25/2021   Left-sided Bell's palsy 08/21/2016   Myocardial infarct [facility])    Near syncope    Right-sided chest pain 03/20/2021   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   Multihance  [Gadobenate] Nausea Only and Cough    PT HAD SNEEZING AND NAUSEA IMMEDIATELY AFTER CONTRAST INJECTION.     Tylenol  [Acetaminophen ] Rash and Other (See Comments)    Stomach cramping     Medications  Current Outpatient Medications:    AIMOVIG  70 MG/ML SOAJ, INJECT THE CONTENTS OF 1 PEN  SUBCUTANEOUSLY ONCE MONTHLY, Disp: 2 mL, Rfl: 5   albuterol  (VENTOLIN  HFA) 108 (90 Base) MCG/ACT inhaler, Inhale 2 puffs into the lungs every 4 (four) hours as needed for shortness of breath., Disp: , Rfl:    aspirin  81 MG chewable tablet, Chew 1 tablet (81 mg total) by mouth daily., Disp: , Rfl:    Cholecalciferol (VITAMIN D3) 1.25 MG (50000 UT) CAPS, Take 1 capsule by mouth every  30 (thirty) days., Disp: , Rfl:    citalopram  (CELEXA ) 40 MG tablet, TAKE 1 TABLET BY MOUTH DAILY, Disp: 90 tablet, Rfl: 3   ezetimibe  (ZETIA ) 10 MG tablet, TAKE 1 TABLET BY MOUTH EVERY DAY, Disp: 90 tablet, Rfl: 1   fluticasone  (FLONASE ) 50 MCG/ACT nasal spray, USE 1 SPRAY IN BOTH NOSTRILS IN  THE MORNING AND AT BEDTIME, Disp: 64 g, Rfl: 0   furosemide  (LASIX ) 40 MG tablet, TAKE 1 TABLET BY MOUTH DAILY AS  NEEDED FOR WEIGHT GAIN OF 3  POUNDS IN 1 DAY OR 5 POUNDS IN 1 WEEK, Disp: 100 tablet, Rfl: 3   lidocaine  (LIDODERM ) 5 %, Place 1 patch onto the skin daily. Remove & Discard patch within 12 hours or as directed by MD, Disp:  30 patch, Rfl: 0   methocarbamol  (ROBAXIN ) 500 MG tablet, Take 1 tablet (500 mg total) by mouth every 8 (eight) hours as needed for muscle spasms., Disp: 20 tablet, Rfl: 0   nitroGLYCERIN  (NITROSTAT ) 0.4 MG SL tablet, PLACE 1 TABLET UNDER THE TONGUE EVERY 5 MINUTES AS NEEDED FOR CHEST PAIN., Disp: 25 tablet, Rfl: 3   olopatadine (PATANOL) 0.1 % ophthalmic solution, Place 1 drop into both eyes 2 (two) times daily as needed for allergies., Disp: , Rfl:    omeprazole  (PRILOSEC) 20 MG capsule, TAKE 1 CAPSULE BY MOUTH DAILY, Disp: 60 capsule, Rfl: 5   ondansetron  (ZOFRAN -ODT) 4 MG disintegrating tablet, Take 4 mg by mouth 2 (two) times daily as needed for nausea/vomiting., Disp: , Rfl:    potassium chloride  SA (KLOR-CON  M) 20 MEQ tablet, Take 1 tablet (20 mEq total) by mouth every other day., Disp: 45 tablet, Rfl: 2   PRESCRIPTION MEDICATION, Take 1 tablet by mouth as needed (migraine). Nurtec - Samples from doctor, Disp: , Rfl:    rOPINIRole  (REQUIP ) 1 MG tablet, Take 1 tablet (1 mg total) by mouth at bedtime., Disp: 90 tablet, Rfl: 1   rosuvastatin  (CRESTOR ) 40 MG tablet, TAKE 1 TABLET BY MOUTH DAILY, Disp: 100 tablet, Rfl: 2   spironolactone  (ALDACTONE ) 25 MG tablet, TAKE 1/2 TABLET BY MOUTH AT BEDTIME, Disp: 50 tablet, Rfl: 3   topiramate  (TOPAMAX ) 50 MG tablet, TAKE 1 TABLET BY MOUTH DAILY, Disp: 60 tablet, Rfl: 5   TRELEGY ELLIPTA  100-62.5-25 MCG/ACT AEPB, USE 1 INHALATION BY MOUTH DAILY, Disp: 180 each, Rfl: 3   Review of Systems Review of Systems  Constitutional:  Negative for chills and fever.  Eyes:  Negative for blurred vision and double vision.  Respiratory:  Negative for shortness of breath.   Cardiovascular:  Negative for chest pain.  Gastrointestinal:  Positive for nausea. Negative for abdominal pain, constipation, diarrhea and vomiting.  Musculoskeletal:  Positive for neck pain. Negative for myalgias.  Skin:  Negative for itching and rash.  Neurological:  Positive for dizziness and  headaches. Negative for focal weakness, seizures and weakness.       Objective:    Vitals BP 112/70   Pulse 74   Temp (!) 97.5 F (36.4 C) (Temporal)   Resp 16   Ht 5' 3 (1.6 m)   Wt 168 lb 6.4 oz (76.4 kg)   SpO2 97%   BMI 29.83 kg/m    Physical Examination Physical Exam Constitutional:      Appearance: Normal appearance. She is not ill-appearing.  Cardiovascular:     Rate and Rhythm: Normal rate and regular rhythm.     Pulses: Normal pulses.     Heart sounds: No murmur heard.  No friction rub. No gallop.  Pulmonary:     Effort: Pulmonary effort is normal. No respiratory distress.     Breath sounds: No wheezing, rhonchi or rales.  Abdominal:     General: Bowel sounds are normal. There is no distension.     Palpations: Abdomen is soft.     Tenderness: There is no abdominal tenderness.  Musculoskeletal:     Right lower leg: No edema.     Left lower leg: No edema.  Skin:    General: Skin is warm and dry.     Findings: No rash.  Neurological:     Mental Status: She is alert.     Comments: She does have a right sided chronic facial droop but otherwise her CN II-XII are fully intact.  Her strength is 5/5 throughout.        Assessment & Plan:   Hydrocephalus (HCC) I want her to go to the ER because by the time I get a MRI with shunt series done and talk to the neurosurgeons if there is a problem, this would cause a significant delay.  I want her to go to [facility] or main medical center at [facility]  She understands and agrees.    No follow-ups on file.   Selinda Fleeta Finger, MD

## 2023-11-03 NOTE — Assessment & Plan Note (Signed)
 I want her to go to the ER because by the time I get a MRI with shunt series done and talk to the neurosurgeons if there is a problem, this would cause a significant delay.  I want her to go to [facility] or main medical center at [facility].  She understands and agrees.

## 2023-11-04 DIAGNOSIS — T8501XA Breakdown (mechanical) of ventricular intracranial (communicating) shunt, initial encounter: Secondary | ICD-10-CM | POA: Diagnosis not present

## 2023-11-04 DIAGNOSIS — J45909 Unspecified asthma, uncomplicated: Secondary | ICD-10-CM | POA: Diagnosis not present

## 2023-11-04 DIAGNOSIS — T85618A Breakdown (mechanical) of other specified internal prosthetic devices, implants and grafts, initial encounter: Secondary | ICD-10-CM | POA: Insufficient documentation

## 2023-11-04 DIAGNOSIS — G43909 Migraine, unspecified, not intractable, without status migrainosus: Secondary | ICD-10-CM | POA: Diagnosis not present

## 2023-11-04 DIAGNOSIS — G919 Hydrocephalus, unspecified: Secondary | ICD-10-CM | POA: Diagnosis not present

## 2023-11-04 DIAGNOSIS — Z9049 Acquired absence of other specified parts of digestive tract: Secondary | ICD-10-CM | POA: Diagnosis not present

## 2023-11-04 DIAGNOSIS — G969 Disorder of central nervous system, unspecified: Secondary | ICD-10-CM | POA: Diagnosis not present

## 2023-11-04 DIAGNOSIS — R519 Headache, unspecified: Secondary | ICD-10-CM | POA: Diagnosis not present

## 2023-11-04 DIAGNOSIS — I252 Old myocardial infarction: Secondary | ICD-10-CM | POA: Diagnosis not present

## 2023-11-04 DIAGNOSIS — Z982 Presence of cerebrospinal fluid drainage device: Secondary | ICD-10-CM | POA: Diagnosis not present

## 2023-11-04 DIAGNOSIS — F17210 Nicotine dependence, cigarettes, uncomplicated: Secondary | ICD-10-CM | POA: Diagnosis not present

## 2023-11-04 DIAGNOSIS — T8509XA Other mechanical complication of ventricular intracranial (communicating) shunt, initial encounter: Secondary | ICD-10-CM | POA: Diagnosis not present

## 2023-11-07 ENCOUNTER — Other Ambulatory Visit: Payer: Self-pay | Admitting: Cardiology

## 2023-11-12 ENCOUNTER — Ambulatory Visit: Admitting: Internal Medicine

## 2023-11-12 DIAGNOSIS — G4733 Obstructive sleep apnea (adult) (pediatric): Secondary | ICD-10-CM | POA: Diagnosis not present

## 2023-11-12 DIAGNOSIS — J45909 Unspecified asthma, uncomplicated: Secondary | ICD-10-CM | POA: Diagnosis not present

## 2023-11-13 DIAGNOSIS — J22 Unspecified acute lower respiratory infection: Secondary | ICD-10-CM | POA: Diagnosis not present

## 2023-11-20 ENCOUNTER — Ambulatory Visit: Admitting: Internal Medicine

## 2023-11-20 ENCOUNTER — Encounter: Payer: Self-pay | Admitting: Internal Medicine

## 2023-11-20 VITALS — BP 100/74 | HR 75 | Temp 97.9°F | Resp 16 | Ht 63.0 in | Wt 175.2 lb

## 2023-11-20 DIAGNOSIS — G4733 Obstructive sleep apnea (adult) (pediatric): Secondary | ICD-10-CM

## 2023-11-20 DIAGNOSIS — I2510 Atherosclerotic heart disease of native coronary artery without angina pectoris: Secondary | ICD-10-CM | POA: Diagnosis not present

## 2023-11-20 DIAGNOSIS — Z6831 Body mass index (BMI) 31.0-31.9, adult: Secondary | ICD-10-CM

## 2023-11-20 DIAGNOSIS — G43109 Migraine with aura, not intractable, without status migrainosus: Secondary | ICD-10-CM | POA: Diagnosis not present

## 2023-11-20 DIAGNOSIS — E559 Vitamin D deficiency, unspecified: Secondary | ICD-10-CM

## 2023-11-20 DIAGNOSIS — Z23 Encounter for immunization: Secondary | ICD-10-CM | POA: Diagnosis not present

## 2023-11-20 DIAGNOSIS — F411 Generalized anxiety disorder: Secondary | ICD-10-CM

## 2023-11-20 DIAGNOSIS — K219 Gastro-esophageal reflux disease without esophagitis: Secondary | ICD-10-CM

## 2023-11-20 DIAGNOSIS — J4530 Mild persistent asthma, uncomplicated: Secondary | ICD-10-CM | POA: Diagnosis not present

## 2023-11-20 DIAGNOSIS — I5042 Chronic combined systolic (congestive) and diastolic (congestive) heart failure: Secondary | ICD-10-CM

## 2023-11-20 DIAGNOSIS — E78019 Familial hypercholesterolemia, unspecified: Secondary | ICD-10-CM | POA: Insufficient documentation

## 2023-11-20 DIAGNOSIS — F330 Major depressive disorder, recurrent, mild: Secondary | ICD-10-CM

## 2023-11-20 DIAGNOSIS — E6609 Other obesity due to excess calories: Secondary | ICD-10-CM

## 2023-11-20 DIAGNOSIS — Z0000 Encounter for general adult medical examination without abnormal findings: Secondary | ICD-10-CM | POA: Diagnosis not present

## 2023-11-20 DIAGNOSIS — Z8669 Personal history of other diseases of the nervous system and sense organs: Secondary | ICD-10-CM | POA: Insufficient documentation

## 2023-11-20 DIAGNOSIS — I255 Ischemic cardiomyopathy: Secondary | ICD-10-CM

## 2023-11-20 DIAGNOSIS — G2581 Restless legs syndrome: Secondary | ICD-10-CM

## 2023-11-20 DIAGNOSIS — E66811 Obesity, class 1: Secondary | ICD-10-CM

## 2023-11-20 MED ORDER — NURTEC 75 MG PO TBDP: 1.0000 | ORAL_TABLET | Freq: Every day | ORAL | 5 refills | Status: AC | PRN

## 2023-11-20 NOTE — Assessment & Plan Note (Signed)
 Plan as above.

## 2023-11-20 NOTE — Assessment & Plan Note (Signed)
 Continue on celexa  at this time.

## 2023-11-20 NOTE — Assessment & Plan Note (Signed)
 We will check a FLP on her at this time.  Her goal LDL <70 with her history of MI/CAD.

## 2023-11-20 NOTE — Assessment & Plan Note (Signed)
 Her GERD is controlled on omepraozle.

## 2023-11-20 NOTE — Assessment & Plan Note (Signed)
I want her to eat healthy, exercise and lose weight. 

## 2023-11-20 NOTE — Assessment & Plan Note (Signed)
 Continue with her current inhalers.  There is no change in her baseline DOE.

## 2023-11-20 NOTE — Assessment & Plan Note (Signed)
 She remains on topamax  for prevention nurtec to abort her migraines.

## 2023-11-20 NOTE — Assessment & Plan Note (Signed)
 We will check a Viit D level today.

## 2023-11-20 NOTE — Assessment & Plan Note (Signed)
 She is s/p VP shunt replacmeent this month.

## 2023-11-20 NOTE — Assessment & Plan Note (Signed)
 She is compliant with CPAP at home.  Continue with weight loss measures.

## 2023-11-20 NOTE — Progress Notes (Addendum)
 Office Visit  Subjective   Patient ID: Maria Ellis   DOB: 09-23-1985   Age: 38 y.o.   MRN: [id number]   Chief Complaint Chief Complaint  Patient presents with   Annual Exam    Fasting AWV     History of Present Illness Maria Ellis is a 38 y.o. female who returns today for an annual examination.  Her last eye exam was in the Summer 2025 where they noted she has cataract in her left eye they are following. She does uses glasses. She had developed some rectal bleeding in 03/2021 where she saw GI. They did a CT virtual colonoscopy on 09/03/2021 and this showed a 8mm polyp in her distal ascending colon. They were discussed about colonoscopy for removal but due to her CAD (see below), they wanted her to hold off on this procedure. She denies any BRBPR or melena today. There is no family history of colorectal cancer or breast cancer. She has not had a mammogram. She is followed by [facility] in [place] where she states they did a PAP smear which was normal in 2022. She is currently exercising by walking. The patient does get yearly flu vaccine. She has not had any COVID-19 vaccines. Her brother also had a history of heart disease. There is no family history of stroke. She is currently on ASA 81mg  daily.   I did see her 2-3 weeks ago where I felt that she had a VP shunt malfunction.  She came in with complaints of headache, dizziness, nausea and right sided neck pain and decreased appetite over the last 5 days.  There has been no fevers, seiziures, irritability/personality change, vision problmes or other problems.  She has a history of hydrocephalus since she was born, had her first VP shunt placement when she was 71 months old. At age 11, she developed sleepiness, frequent headaches, nausea and vomiting, was diagnosed with VP shunt blockage, had replacement when she was 38 years old, she has been doing well since then.  In 2014, she developed frequent headaches ago, right arm numbness  tingling weakness where she was diagnosed with Arnold-Chiari malformation and had decompression surgery at [facility], which has helped her symptoms, but only partially.  She has not had a MRI or CT scan of her brain in years.   She continues with right facial weakness due to Bell's palsy.  She went to [facility] ER and was admitted from 11/04/2023 until 11/06/2023 where she had acute hydrocephalus due to ventriculoperitoneal shunt malfunction on 11/04/2023 with Dr. Garnette Bouquet.  She had a replacement of right occipital ventriculoperitoneal shunt with a substantially similar device. She has seen neurosurgery in followup and her shunt is working well and her site is well healed.     The patient has a history of migraine disorder which she states began around the age of 23.  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  As an infant she had a ventriculoperioneal (VP) shunt placed.  She also had a chiari malformation decompression surgery in 2014.  She states that her migraines are located either frontal or occipital.  These occurring as a sharp stabbing pain that can last more than 24 hours.  She states she is having about 2-3 migraines per month.  She does get a visual aura before her migraines occur.  She has tried alleve in the past at the onset of the aura and sometimes this helps.  She used to be on rizatriptan   but I stopped her off this due to her recent STEMI/CAD.  I did give her samples of nurtec for abortive therapy and this does help.  The patient is on aimovig  and Topamax  50mg  daily which is controlling her migraines where she used to have this 8-10 times per month.  She does get nausea/vomiting and photophonophobia when she has migraines.  The patient has had Bell's Palsy with 8 episodes in the last 13 years.  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  She cannot lift her right eye brow.   The patient  also has restless leg syndrome since she had her MI.  She is currently on ropinrole 1mg  at bedtime which controls her legs.  The patient is a 38 year old Caucasian/White female who has moderately severe CAD where she had an anterior STEMI in 03/2021 . She was found at home at that time down where her significant other started CPR. She was found to have V. Fib arrest with unknown down time. The patient was taken to [facility] ER and was transferred to [facility] for a Code STEMI. She was taken to the catherization lab on 03/24/2021 where they noted a 100% LAD lesion and she underwent PCI with stenting to her LAD. They felt at that time she had acute combined systolic and diastolic CHF where where her EF was 35-40% at that time. The patient was discharged home on a number of medications including ASA, brilinta  and high intensity statin. She has CAD with ischemic cardiomyopathy/chronic systolic CHF. They did an outpatient ECHO on 06/14/2021 that showed no evidence of a LV thrombus and her EF improved to 40-45%. She also underwent event monitoring on 06/03/2021 and this showed rare ventricular ectopy and no episodes of ventricular tachycardia. See PMH for summary of cardiac history and status of revascularization. Her CAD is controlled with therapy as summarized in the medication list and previous notes. She has familial hypercholesterolemia where her last lipid test was done on 05/15/2021 and her LDL was 36 and her TC was 890. She is currently not on an ARB/ACE-I or entresto due to symptomatic hypotension in the past.  They were not sure what precipitated her hypotension but she is now back on farxiga  10mg  daily for her heart as well as spironolactone  12.5mg  daily.  The patient was sent to cardiac rehab and she did graduate that on 06/20/2021. She is still going to the rehab center to work out and does exercises at home. The patient does not smoke. She stopped smoking on the day of her MI. The patient has the following comorbid  condition(s): CHF. She has the following baseline symptoms: exertional dyspnea. She states she will get DOE if she walks for more than 20-25 min on the treadmill. Specifically denied complaint(s): chest pain, palpitations, orthopnea, edema, and syncope. Interval studies: none.  She last saw Dr. Monetta in cardiology in 01/2023 where he felt her CAD was stable.  He repeat an ECHO on 03/19/2023 and this showed a mildly decreased LV function with a LVEF of 40-45%.  The left ventricle has no regional wall motion abnormalities. The left ventricular internal cavity size was mildly to moderately dilated. Left ventricular diastolic parameters are consistent with Grade I diastolic dysfunction (impaired relaxation). Right ventricular systolic function is normal. The right ventricular size is normal.   Maria Ellis returns today for routine followup on her familial hypercholesterolemia.  I did  check her FLP on 07/10/2021 and her LDL was 23 at that time.   Overall, she states she is doing well and is without any complaints or problems at this time. She specifically denies abdominal pain, nausea, vomiting, diarrhea, myalgias, and fatigue. She remains on dietary management as well as a regular exercise program and the following cholesterol lowering medications Crestor  40 mg qhs and ezetimibe  10 mg tablet. She is fasting in anticipation for labs today.    The patient was followed by Dr. Elaine at [facility] for her suboxone  use.  She states she had back pain in the past and was on oxycodone .  She states she was obtaining her oxycodone  from her physician but she skipped one month and then was sent to a suboxone  doctor.  She is currently been on suboxone  for 2-3 years where she is now followed by [facility].  The patient also presents moderately severe asthma which was diagnosed about 10 years ago and presents today for a status visit. See PMH for summary of pulmonary history and lab reports for status of any  previous PFTs: Asthma and CAD. Comorbid conditions : none.. She has no baseline symptoms of asthma. Again, she does cardiac rehab and will not get DOE or wheezing until she is on the treadmill for more than 20-25 min. The patient's condition is controlled by medications as summarized in the medication list on the face sheet. She is currently on trelegy and albuterol  HFA as needed. She states she does not use the albuterol  HFA often with 1-2 times of use per month. She has the following modifiable risk factors: CHF, sedentary lifestyle, and obesity. Specifically denied complaints: worsening exertional dyspnea, increased wheezing, productive cough, pleuritic chest pain, purulent sputum, hemoptysis, and fever. She was also referred to Dr. Beulah for a sleep study which was done this past year and she is now on a CPAP with pressure of Hg which she just started to use last week. The patient has a history of seasonal allergies which usually effect her in the Spring. She uses allegra and Flonase  as needed. She also has a history of GERD where she is on omeprazole  and this controls her symptoms.   The patient reports a history of depression and anxiety. She was diagnosed with depression and anxiety around 38 years of age. She states she was started on citalopram  8 years ago where she is currently on citalopram  40mg  daily. Today, the patient states her anxiety and depression are currently mild and controlled. She may have one panic attack every 6 months. She also reports loss of appetite. She denies difficulty concentrating, difficulty performing routine daily activities, fatigue, extreme feelings of guilt, feelings of isolation, feelings of worthlessness, helpless feeling, suicidal ideation, homicidal ideation, weight loss, insomnia, social withdrawal, loss of interest in pleasurable activities, and out of control feelings. This patient feels that she is able to care for herself. She currently lives with her fiance.  She has no significant prior history of mental health disorders.                Past Medical History Past Medical History:  Diagnosis Date   Acute appendicitis 05/18/2013   Acute on chronic systolic heart failure (HCC) 07/17/2022   Anterior ST segment elevation (HCC) 03/20/2021   Bell's palsy    Cardiac arrest with ventricular fibrillation (HCC) 03/20/2021   Chest pain 07/15/2022   Depression with anxiety 04/06/2017   Elevated troponin 03/20/2021   Headache    High  cholesterol    History of Chiari malformation    Hydrocephalus (HCC)    Hypotension 08/25/2021   Left-sided Bell's palsy 08/21/2016   Myocardial infarct [facility])    Near syncope    Right-sided chest pain 03/20/2021   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   Multihance  [Gadobenate] Nausea Only and Cough    PT HAD SNEEZING AND NAUSEA IMMEDIATELY AFTER CONTRAST INJECTION.     Tylenol  [Acetaminophen ] Rash and Other (See Comments)    Stomach cramping     Medications  Current Outpatient Medications:    AIMOVIG  70 MG/ML SOAJ, INJECT THE CONTENTS OF 1 PEN  SUBCUTANEOUSLY ONCE MONTHLY, Disp: 2 mL, Rfl: 5   albuterol  (VENTOLIN  HFA) 108 (90 Base) MCG/ACT inhaler, Inhale 2 puffs into the lungs every 4 (four) hours as needed for shortness of breath., Disp: , Rfl:    aspirin  81 MG chewable tablet, Chew 1 tablet (81 mg total) by mouth daily., Disp: , Rfl:    Cholecalciferol (VITAMIN D3) 1.25 MG (50000 UT) CAPS, Take 1 capsule by mouth every 30 (thirty) days., Disp: , Rfl:    citalopram  (CELEXA ) 40 MG tablet, TAKE 1 TABLET BY MOUTH DAILY, Disp: 90 tablet,  Rfl: 3   ezetimibe  (ZETIA ) 10 MG tablet, TAKE 1 TABLET BY MOUTH DAILY, Disp: 90 tablet, Rfl: 0   fluticasone  (FLONASE ) 50 MCG/ACT nasal spray, USE 1 SPRAY IN BOTH NOSTRILS IN  THE MORNING AND AT BEDTIME, Disp: 64 g, Rfl: 0   furosemide  (LASIX ) 40 MG tablet, TAKE 1 TABLET BY MOUTH DAILY AS  NEEDED FOR WEIGHT GAIN OF 3  POUNDS IN 1 DAY OR 5 POUNDS IN 1 WEEK, Disp: 100 tablet, Rfl: 3   lidocaine  (LIDODERM ) 5 %, Place 1 patch onto the skin daily. Remove & Discard patch within 12 hours or as directed by MD, Disp: 30 patch, Rfl: 0   nitroGLYCERIN  (NITROSTAT ) 0.4 MG SL tablet, PLACE 1 TABLET UNDER THE TONGUE EVERY 5 MINUTES AS NEEDED FOR CHEST PAIN., Disp: 25 tablet, Rfl: 3   olopatadine (PATANOL) 0.1 % ophthalmic solution, Place 1 drop into both eyes 2 (two) times daily as needed for allergies., Disp: , Rfl:    omeprazole  (PRILOSEC) 20 MG capsule, TAKE 1 CAPSULE BY MOUTH DAILY, Disp: 60 capsule, Rfl: 5   ondansetron  (ZOFRAN -ODT) 4 MG disintegrating tablet, Take 4 mg by mouth 2 (two) times daily as needed for nausea/vomiting., Disp: , Rfl:    potassium chloride  SA (KLOR-CON  M) 20 MEQ tablet, Take 1 tablet (20 mEq total) by mouth every other day., Disp: 45 tablet, Rfl: 2   PRESCRIPTION MEDICATION, Take 1 tablet by mouth as needed (migraine). Nurtec - Samples from doctor, Disp: , Rfl:    Rimegepant Sulfate (NURTEC) 75 MG TBDP, Take 1 tablet (75 mg total) by mouth daily as needed for up to 10 days., Disp: 10 tablet, Rfl: 5   rOPINIRole  (REQUIP ) 1 MG tablet, Take 1 tablet (1 mg total) by mouth at bedtime., Disp: 90 tablet, Rfl: 1   rosuvastatin  (CRESTOR ) 40 MG tablet, TAKE 1 TABLET BY MOUTH DAILY, Disp: 100 tablet, Rfl:  2   spironolactone  (ALDACTONE ) 25 MG tablet, TAKE 1/2 TABLET BY MOUTH AT BEDTIME, Disp: 50 tablet, Rfl: 3   topiramate  (TOPAMAX ) 50 MG tablet, TAKE 1 TABLET BY MOUTH DAILY, Disp: 60 tablet, Rfl: 5   TRELEGY ELLIPTA  100-62.5-25 MCG/ACT AEPB, USE 1 INHALATION BY MOUTH DAILY, Disp: 180 each, Rfl: 3    Review of Systems Review of Systems  Constitutional:  Negative for chills, fever and malaise/fatigue.  Eyes:  Negative for blurred vision and double vision.  Respiratory:  Negative for cough, hemoptysis, shortness of breath and wheezing.   Cardiovascular:  Negative for chest pain, palpitations and leg swelling.  Gastrointestinal:  Negative for abdominal pain, blood in stool, constipation, diarrhea, heartburn, melena, nausea and vomiting.  Genitourinary:  Negative for frequency and hematuria.  Musculoskeletal:  Negative for myalgias.  Skin:  Negative for itching and rash.  Neurological:  Negative for dizziness, weakness and headaches.  Endo/Heme/Allergies:  Negative for polydipsia.       Objective:    Vitals BP 100/74   Pulse 75   Temp 97.9 F (36.6 C) (Temporal)   Resp 16   Ht 5' 3 (1.6 m)   Wt 175 lb 3.2 oz (79.5 kg)   SpO2 98%   BMI 31.04 kg/m    Physical Examination Physical Exam Constitutional:      Appearance: Normal appearance. She is not ill-appearing.  HENT:     Head: Normocephalic and atraumatic.     Right Ear: Tympanic membrane and ear canal normal.     Left Ear: Tympanic membrane, ear canal and external ear normal.     Ears:     Comments: Bilateral tympanostomy tubes in place    Nose: Nose normal. No congestion or rhinorrhea.     Mouth/Throat:     Mouth: Mucous membranes are moist.     Pharynx: Oropharynx is clear. No oropharyngeal exudate or posterior oropharyngeal erythema.  Eyes:     General: No scleral icterus.    Conjunctiva/sclera: Conjunctivae normal.     Pupils: Pupils are equal, round, and reactive to light.  Cardiovascular:     Rate and Rhythm: Normal rate and regular rhythm.     Pulses: Normal pulses.     Heart sounds: No murmur heard.    No friction rub. No gallop.  Pulmonary:     Effort: Pulmonary effort is normal. No respiratory distress.     Breath sounds: No wheezing, rhonchi or rales.  Abdominal:     General: Bowel sounds are  normal. There is no distension.     Palpations: Abdomen is soft.     Tenderness: There is no abdominal tenderness.  Musculoskeletal:     Cervical back: Neck supple. No tenderness.     Right lower leg: No edema.     Left lower leg: No edema.  Lymphadenopathy:     Cervical: No cervical adenopathy.  Skin:    General: Skin is warm and dry.     Findings: No rash.  Neurological:     General: No focal deficit present.     Mental Status: She is alert and oriented to person, place, and time.  Psychiatric:        Mood and Affect: Mood normal.        Behavior: Behavior normal.        Assessment & Plan:   Migraine with aura and without status migrainosus, not intractable She remains on topamax  for prevention nurtec to abort her migraines.  Ischemic cardiomyopathy Plan as below.  Coronary artery disease involving native coronary artery of native heart without angina pectoris She saw Dr. Monetta and he felt her CAD is stable.  She denies any angina today.  We will continue on her statin and ASA.  Chronic combined systolic and diastolic CHF (congestive heart failure) (HCC) She seems euvolemic without signs of volume overload.  She remains on aldactone  and lasix .  OSA (obstructive sleep apnea) She is compliant with CPAP at home.  Continue with weight loss measures.  Mild persistent asthma without complication Continue with her current inhalers.  There is no change in her baseline DOE.  Gastroesophageal reflux disease Her GERD is controlled on omepraozle.  Vitamin D  deficiency We will check a Viit D level today.  Restless leg syndrome Continue on requip .  Mild episode of recurrent major depressive disorder She has mild recurrent major depression that is doing well on her current dose of celexa .  History of hydrocephalus She is s/p VP shunt replacmeent this month.  History of Chiari malformation Plan as above.  History of Bell's palsy Resolved.  GAD (generalized anxiety  disorder) Continue on celexa  at this time.  Familial hypercholesterolemia We will check a FLP on her at this time.  Her goal LDL <70 with her history of MI/CAD.  Class 1 obesity due to excess calories with serious comorbidity and body mass index (BMI) of 31.0 to 31.9 in adult I want her to eat healthy, exercise and lose weight.  BMI 31.0-31.9,adult Plan as above.  Annual physical exam Health maintenance discussed.  We will obtain some yearly labs.    Return in about 3 months (around 02/20/2024).   Selinda Fleeta Finger, MD

## 2023-11-20 NOTE — Assessment & Plan Note (Signed)
 She seems euvolemic without signs of volume overload.  She remains on aldactone  and lasix .

## 2023-11-20 NOTE — Assessment & Plan Note (Signed)
 Plan as below.

## 2023-11-20 NOTE — Assessment & Plan Note (Signed)
 She has mild recurrent major depression that is doing well on her current dose of celexa .

## 2023-11-20 NOTE — Assessment & Plan Note (Signed)
Continue on requip ?

## 2023-11-20 NOTE — Assessment & Plan Note (Signed)
 Resolved

## 2023-11-20 NOTE — Assessment & Plan Note (Signed)
 She saw Dr. Monetta and he felt her CAD is stable.  She denies any angina today.  We will continue on her statin and ASA.

## 2023-11-20 NOTE — Assessment & Plan Note (Signed)
 Health maintenance discussed.  We will obtain some yearly labs.

## 2023-11-21 LAB — LIPID PANEL
Chol/HDL Ratio: 2.3 ratio (ref 0.0–4.4)
Cholesterol, Total: 134 mg/dL (ref 100–199)
HDL: 59 mg/dL (ref >39)
LDL Chol Calc (NIH): 58 mg/dL (ref 0–99)
Triglycerides: 89 mg/dL (ref 0–149)
VLDL Cholesterol Cal: 17 mg/dL (ref 5–40)

## 2023-11-21 LAB — TSH: TSH: 1.16 u[IU]/mL (ref 0.450–4.500)

## 2023-11-21 LAB — HEMOGLOBIN A1C
Est. average glucose Bld gHb Est-mCnc: 108 mg/dL
Hgb A1c MFr Bld: 5.4 % (ref 4.8–5.6)

## 2023-11-21 LAB — VITAMIN D 25 HYDROXY (VIT D DEFICIENCY, FRACTURES): Vit D, 25-Hydroxy: 34 ng/mL (ref 30.0–100.0)

## 2023-12-05 ENCOUNTER — Other Ambulatory Visit: Payer: Self-pay | Admitting: Internal Medicine

## 2023-12-11 ENCOUNTER — Encounter: Payer: Self-pay | Admitting: Cardiology

## 2023-12-11 ENCOUNTER — Other Ambulatory Visit: Payer: Self-pay

## 2023-12-11 MED ORDER — ROPINIROLE HCL 1 MG PO TABS: 1.0000 mg | ORAL_TABLET | Freq: Every day | ORAL | 1 refills | Status: DC

## 2023-12-11 NOTE — Progress Notes (Signed)
 Refilled Ropinirole  1mg  to [facility]

## 2023-12-14 ENCOUNTER — Ambulatory Visit: Payer: Self-pay

## 2023-12-14 NOTE — Progress Notes (Signed)
 Patient called.  Patient aware.

## 2023-12-23 ENCOUNTER — Other Ambulatory Visit: Payer: Self-pay | Admitting: Internal Medicine

## 2024-01-29 ENCOUNTER — Other Ambulatory Visit: Payer: Self-pay | Admitting: Cardiology

## 2024-02-08 ENCOUNTER — Other Ambulatory Visit: Payer: Self-pay

## 2024-02-08 MED ORDER — ROPINIROLE HCL 1 MG PO TABS: 1.0000 mg | ORAL_TABLET | Freq: Every day | ORAL | 0 refills | Status: DC

## 2024-02-09 ENCOUNTER — Encounter: Payer: Self-pay | Admitting: Cardiology

## 2024-02-09 ENCOUNTER — Telehealth: Payer: Self-pay

## 2024-02-09 ENCOUNTER — Other Ambulatory Visit: Payer: Self-pay

## 2024-02-09 DIAGNOSIS — I5042 Chronic combined systolic (congestive) and diastolic (congestive) heart failure: Secondary | ICD-10-CM

## 2024-02-09 DIAGNOSIS — R0600 Dyspnea, unspecified: Secondary | ICD-10-CM

## 2024-02-09 MED ORDER — METOLAZONE 5 MG PO TABS: 5.0000 mg | ORAL_TABLET | ORAL | 0 refills | Status: DC

## 2024-02-09 MED ORDER — FUROSEMIDE 40 MG PO TABS: 40.0000 mg | ORAL_TABLET | Freq: Two times a day (BID) | ORAL | 3 refills | Status: DC

## 2024-02-09 NOTE — Telephone Encounter (Signed)
 Patient called the office and reported that she had increased swelling in her lower extremities, face and she felt bloated in her abdomen. She stated that over the past week she has gained 11 lbs while continuing to take her Spirinolactone and Lasix . As she realized she was gaining so much fluid weight she doubled the dose of her Spirinolactone and Lasix  with no response to reduce her swelling. She also has increased SOB. Spoke to Dr. Liborio and he recommended for her to keep taking 12.5 mg of Spirinolactone. Increase her Lasix  to 40 mg two times daily and to start Metalazone 5 mg every other day for 2 doses. He also recommended a BMP and Pro BNP to be drawn. I relayed Dr. Madireddy's recommendation to the patient and she was agreeable to Dr. Madireddy's recommendations. Patient verbalized understanding and had no further questions at this time.Metalazone and Lasix  medications were ordered via Epic and sent to her pharmacy. Labs were ordered via Epic.

## 2024-02-11 ENCOUNTER — Other Ambulatory Visit: Payer: Self-pay

## 2024-02-11 MED ORDER — FLUTICASONE PROPIONATE 50 MCG/ACT NA SUSP: 1.0000 | Freq: Two times a day (BID) | NASAL | 3 refills | Status: AC

## 2024-02-11 MED ORDER — ROPINIROLE HCL 1 MG PO TABS: 1.0000 mg | ORAL_TABLET | Freq: Every day | ORAL | 0 refills | Status: AC

## 2024-02-12 DIAGNOSIS — I213 ST elevation (STEMI) myocardial infarction of unspecified site: Secondary | ICD-10-CM | POA: Insufficient documentation

## 2024-02-12 LAB — BASIC METABOLIC PANEL WITH GFR
BUN/Creatinine Ratio: 15 (ref 9–23)
BUN: 12 mg/dL (ref 6–20)
CO2: 27 mmol/L (ref 20–29)
Calcium: 9.3 mg/dL (ref 8.7–10.2)
Chloride: 95 mmol/L — ABNORMAL LOW (ref 96–106)
Creatinine, Ser: 0.79 mg/dL (ref 0.57–1.00)
Glucose: 142 mg/dL — ABNORMAL HIGH (ref 70–99)
Potassium: 3.4 mmol/L — ABNORMAL LOW (ref 3.5–5.2)
Sodium: 137 mmol/L (ref 134–144)
eGFR: 98 mL/min/1.73

## 2024-02-12 LAB — PRO B NATRIURETIC PEPTIDE: NT-Pro BNP: 95 pg/mL (ref 0–130)

## 2024-02-16 NOTE — Progress Notes (Unsigned)
 " Cardiology Office Note:    Date:  02/17/2024   ID:  Maria Ellis, DOB [birth year], MRN [id number]  PCP:  Caleen Dirks, MD  Cardiologist:  Redell Leiter, MD    Referring MD: Caleen Dirks, MD    ASSESSMENT:    1. Coronary artery disease of native artery of native heart with stable angina pectoris   2. Chronic systolic heart failure (HCC)   3. Heterozygous familial hypercholesterolemia    PLAN:    In order of problems listed above:  Heart failure is decompensated but not severely changes in her diuretics as noted 1 week recheck BMP proBNP and will do a hemoglobin A1c with elevated glucose Reassess in the office in about 6 weeks if blood pressures are doing well we could try a low-dose of losartan  as a vasodilator Stable CAD continue her medical treatment   Next appointment: I have asked her to come back in 6 weeks to reassess   Medication Adjustments/Labs and Tests Ordered: Current medicines are reviewed at length with the patient today.  Concerns regarding medicines are outlined above.  No orders of the defined types were placed in this encounter.  No orders of the defined types were placed in this encounter.    History of Present Illness:    Maria Ellis is a 39 y.o. female with a hx of coronary artery disease ischemic cardiomyopathy with systolic heart failure familial hyperlipidemia previous presentation out-of-hospital cardiac arrest VF and subsequent PCI and stent LAD March 2023 with initial severe reduction in ejection fraction that improved with guideline directed therapy and did not require an ICD prophylactically  last seen 02/12/2023.  Echocardiogram February 2025 showed EF 40 to 45% GLS -15.4% intolerant of vasodilators Entresto with symptomatic hypotension  Compliance with diet, lifestyle and medications: Yes  Her weight drifted up about 15 pounds she took 2 days of Entresto lost about 5 pounds and she is back up to her previous weight she finds herself edematous but  not short of breath chest pain palpitation or syncope She has noticed when she takes her loop diuretic she does not respond She is very meticulous and self-care compliant with meds sodium restriction It is obvious that she is not responding to furosemide  we will switch to torsemide  and a higher dose and she can use Zaroxolyn  2 days a week until she gets back to her previous baseline less than 170 pounds in 1 week recheck labs after increasing spironolactone  and potassium Would like at this time and need to recheck an echocardiogram  She has had labs done 6 days ago with a potassium of 3.4 sodium 1 2 creatinine 0.79 GFR 98 cc BMP at that time not elevated Past Medical History:  Diagnosis Date   Acute appendicitis 05/18/2013   Acute on chronic systolic heart failure (HCC) 07/17/2022   Anterior ST segment elevation (HCC) 03/20/2021   Bell's palsy    Cardiac arrest with ventricular fibrillation (HCC) 03/20/2021   Chest pain 07/15/2022   Depression with anxiety 04/06/2017   Elevated troponin 03/20/2021   Headache    High cholesterol    History of Chiari malformation    Hydrocephalus (HCC)    Hypotension 08/25/2021   Left-sided Bell's palsy 08/21/2016   Myocardial infarct [facility])    Near syncope    Right-sided chest pain 03/20/2021   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   Spina bifida with hydrocephalus (HCC) 11/19/2012   ST elevation myocardial infarction (STEMI) (HCC)    Tobacco abuse 03/20/2021  Unstable angina (HCC) 07/16/2022   Ventricular fibrillation (HCC) 03/20/2021    Current Medications: Active Medications[1]    EKGs/Labs/Other Studies Reviewed:    The following studies were reviewed today:  Cardiac Studies & Procedures   ______________________________________________________________________________________________ CARDIAC CATHETERIZATION  CARDIAC CATHETERIZATION 07/16/2022  Conclusion   Previously placed Mid LAD stent of unknown type is  widely patent.   There is mild left  ventricular systolic dysfunction.   LV end diastolic pressure is moderately elevated.   The left ventricular ejection fraction is 35-45% by visual estimate.  Patent mid LAD stent without restenosis No obstructive disease in the Circumflex or RCA LVEDP 24 mmHg  Recommendations: medical management of CAD. Given elevated LV filling pressure, she may benefit from additional diuresis. Will give another dose of IV Lasix  today. Echo pending.  Findings Coronary Findings Diagnostic  Dominance: Right  Left Anterior Descending Previously placed Mid LAD stent of unknown type is  widely patent.  Intervention  No interventions have been documented.   CARDIAC CATHETERIZATION  CARDIAC CATHETERIZATION 03/20/2021  Conclusion   CULPRIT LESION: Prox LAD to Mid LAD lesion is 100% stenosed.   A drug-eluting stent was successfully placed using a SYNERGY XD 2.50X28.  Postdilated to 3.1 mm.   Post intervention, there is a 0% residual stenosis.   --------------------------------------   Otherwise angiographically normal RCA, RI and LCx   --------------------------------------   There is moderate left ventricular systolic dysfunction.  The left ventricular ejection fraction is 35-45% by visual estimate.   LV end diastolic pressure is severely elevated.   There is no aortic valve stenosis.  SUMMARY Cardiac arrest with ventricular fibrillation and ROSC secondary to Anterior STEMI Severe single-vessel CAD with 100% thrombotic occlusion of the LAD just after D1 Successful DES PCI of LAD restoring TIMI-3 flow using Synergy XD DES 2.5 x 28 mm stent postdilated to 3.1 mm. Acute combined systolic and diastolic heart failure: EF estimated roughly 40% by LV gram with EDP of 28 to 30 mmHg.   RECOMMENDATIONS We will continue lidocaine  and Aggrastat  drip for 2 to 3 hours post PCI to allow for further stabilization Check 2D echocardiogram on March 2 to allow time for stabilization Given 4 mg IV Lasix   post-cath-reassess Start low-dose beta-blocker-carvedilol  3.125 mg twice daily.  We will need to titrate as blood pressure tolerates and consider addition of ARB/ARNI and spironolactone . Increase statin dose to 80 mg daily Smoking station counseling    Alm Clay, MD  Findings Coronary Findings Diagnostic  Dominance: Right  Left Anterior Descending Prox LAD to Mid LAD lesion is 100% stenosed. Vessel is the culprit lesion. The lesion is type A, located at the major branch, segmental, thrombotic and ulcerative.  First Diagonal Branch Vessel is small in size.  Second Diagonal Branch Vessel is small in size.  Second Septal Branch Vessel is small in size.  Third Diagonal Branch Vessel is small in size.  Ramus Intermedius Vessel is large.  Left Circumflex Vessel is moderate in size.  First Obtuse Marginal Branch Vessel is small in size.  Right Coronary Artery Vessel is large.  Acute Marginal Branch Vessel is small in size.  Right Ventricular Branch Vessel is small in size.  Right Posterior Descending Artery Vessel is moderate in size.  First Right Posterolateral Branch Vessel is small in size.  Second Right Posterolateral Branch Vessel is moderate in size.  Third Right Posterolateral Branch Vessel is small in size.  Intervention  Prox LAD to Mid LAD lesion Stent Lesion length:  26  mm. CATH VISTA GUIDE 6FR XBLAD3.5 guide catheter was inserted. Lesion crossed with guidewire using a WIRE ASAHI PROWATER 180CM. Pre-stent angioplasty was performed using a BALLN SAPPHIRE 2.0X12. Maximum pressure:  12 atm. Inflation time:  20 sec. A drug-eluting stent was successfully placed using a SYNERGY XD 2.50X28. Maximum pressure: 16 atm. Inflation time: 30 sec. Stent strut is well apposed. Postdilated to 3.1 mm Post-stent angioplasty was performed using a BALLN SAPPHIRE [place] 3.0X18. Maximum pressure:  18 atm. Inflation time:  20 sec. Post-Intervention Lesion Assessment The  intervention was successful. Pre-interventional TIMI flow is 0. Post-intervention TIMI flow is 3. Treated lesion length:  28 mm. No complications occurred at this lesion. There is a 0% residual stenosis post intervention.     ECHOCARDIOGRAM  ECHOCARDIOGRAM COMPLETE 03/19/2023  Narrative ECHOCARDIOGRAM REPORT    Patient Name:   Maria Ellis Date of Exam: 03/19/2023 Medical Rec #:  [id number]     Height:       63.0 in Accession #:    [id number]    Weight:       169.1 lb Date of Birth:  [birth year]     BSA:          1.800 m Patient Age:    37 years      BP:           96/66 mmHg Patient Gender: F             HR:           65 bpm. Exam Location:  [place]  Procedure: 2D Echo, Cardiac Doppler, Color Doppler, Intracardiac Opacification Agent and Strain Analysis (Both Spectral and Color Flow Doppler were utilized during procedure).  Indications:    Coronary artery disease of native artery of native heart with stable angina pectoris (HCC) [I25.118 (ICD-10-CM)]; Chronic systolic heart failure (HCC) [I50.22 (ICD-10-CM)]; Familial hypercholesterolemia [E78.01 (ICD-10-CM)]; Hypotension, unspecified hypotension type [I95.9 (ICD-10-CM)]  History:        Patient has prior history of Echocardiogram examinations, most recent 07/16/2022. CHF, CAD and Previous Myocardial Infarction, Abnormal ECG, Signs/Symptoms:Hypotension; Risk Factors:Dyslipidemia and Current Smoker.  Sonographer:    Charlie Jointer RDCS Referring Phys: [id number] Mubarak Bevens [facility]   Sonographer Comments: Technically difficult study due to poor echo windows. IMPRESSIONS   1. Left ventricular ejection fraction, by estimation, is 40 to 45%. The left ventricle has mildly decreased function. The left ventricle has no regional wall motion abnormalities. The left ventricular internal cavity size was mildly to moderately dilated. Left ventricular diastolic parameters are consistent with Grade I diastolic dysfunction (impaired relaxation).  The average left ventricular global longitudinal strain is -15.4 %. The global longitudinal strain is abnormal. 2. Right ventricular systolic function is normal. The right ventricular size is normal. 3. The mitral valve is normal in structure. No evidence of mitral valve regurgitation. No evidence of mitral stenosis. 4. The aortic valve is normal in structure. Aortic valve regurgitation is not visualized. No aortic stenosis is present. 5. The inferior vena cava is normal in size with greater than 50% respiratory variability, suggesting right atrial pressure of 3 mmHg.  FINDINGS Left Ventricle: Left ventricular ejection fraction, by estimation, is 40 to 45%. The left ventricle has mildly decreased function. The left ventricle has no regional wall motion abnormalities. Definity  contrast agent was given IV to delineate the left ventricular endocardial borders. The average left ventricular global longitudinal strain is -15.4 %. Strain was performed and the global longitudinal strain is abnormal. The left ventricular internal cavity size  was mildly to moderately dilated. There is no left ventricular hypertrophy. Left ventricular diastolic parameters are consistent with Grade I diastolic dysfunction (impaired relaxation).  Right Ventricle: The right ventricular size is normal. No increase in right ventricular wall thickness. Right ventricular systolic function is normal.  Left Atrium: Left atrial size was normal in size.  Right Atrium: Right atrial size was normal in size.  Pericardium: There is no evidence of pericardial effusion.  Mitral Valve: The mitral valve is normal in structure. No evidence of mitral valve regurgitation. No evidence of mitral valve stenosis.  Tricuspid Valve: The tricuspid valve is normal in structure. Tricuspid valve regurgitation is not demonstrated. No evidence of tricuspid stenosis.  Aortic Valve: The aortic valve is normal in structure. Aortic valve regurgitation is not  visualized. No aortic stenosis is present.  Pulmonic Valve: The pulmonic valve was normal in structure. Pulmonic valve regurgitation is not visualized. No evidence of pulmonic stenosis.  Aorta: The aortic root is normal in size and structure.  Venous: The inferior vena cava is normal in size with greater than 50% respiratory variability, suggesting right atrial pressure of 3 mmHg.  IAS/Shunts: No atrial level shunt detected by color flow Doppler.  Additional Comments: 3D imaging was not performed.   LEFT VENTRICLE PLAX 2D LVIDd:         5.10 cm   Diastology LVIDs:         4.10 cm   LV e' medial:    11.30 cm/s LV PW:         0.80 cm   LV E/e' medial:  9.3 LV IVS:        0.90 cm   LV e' lateral:   15.90 cm/s LVOT diam:     1.90 cm   LV E/e' lateral: 6.6 LV SV:         60 LV SV Index:   34        2D Longitudinal Strain LVOT Area:     2.84 cm  2D Strain GLS Avg:     -15.4 %   RIGHT VENTRICLE             IVC RV Basal diam:  3.50 cm     IVC diam: 1.20 cm RV Mid diam:    3.00 cm RV S prime:     16.60 cm/s TAPSE (M-mode): 3.1 cm  LEFT ATRIUM             Index        RIGHT ATRIUM           Index LA diam:        3.40 cm 1.89 cm/m   RA Area:     12.90 cm LA Vol (A2C):   37.7 ml 20.94 ml/m  RA Volume:   31.30 ml  17.38 ml/m LA Vol (A4C):   38.5 ml 21.38 ml/m LA Biplane Vol: 37.9 ml 21.05 ml/m AORTIC VALVE LVOT Vmax:   98.70 cm/s LVOT Vmean:  64.767 cm/s LVOT VTI:    0.213 m  AORTA Ao Root diam: 3.00 cm Ao Asc diam:  2.70 cm Ao Desc diam: 1.90 cm  MITRAL VALVE MV Area (PHT): 4.29 cm     SHUNTS MV Decel Time: 177 msec     Systemic VTI:  0.21 m MV E velocity: 105.00 cm/s  Systemic Diam: 1.90 cm MV A velocity: 40.95 cm/s MV E/A ratio:  2.56  Jennifer Crape MD Electronically signed by Jennifer Crape MD Signature Date/Time: 03/19/2023/6:14:06 PM  Final    MONITORS  LONG TERM MONITOR-LIVE TELEMETRY (3-14 DAYS) 06/03/2021  Narrative Patch Wear Time:  13 days and  14 hours (2023-04-19T15:56:23-0400 to 2023-05-03T06:38:18-0400)  Patient had a min HR of 43 bpm, max HR of 120 bpm, and avg HR of 63 bpm. Predominant underlying rhythm was Sinus Rhythm.  Isolated SVEs were rare (<1.0%), SVE Couplets were rare (<1.0%), and no SVE Triplets were present.  Isolated VEs were rare (<1.0%), VE Couplets were rare (<1.0%), and no VE Triplets were present. Ventricular Bigeminy and Trigeminy were present.  There were 2 triggered and 4 diary events, all SRTH 1 with a single PVC.       ______________________________________________________________________________________________          Recent Labs: 07/15/2023: ALT 18 07/24/2023: Hemoglobin 12.5; Platelets 238 11/20/2023: TSH 1.160 02/11/2024: BUN 12; Creatinine, Ser 0.79; NT-Pro BNP 95; Potassium 3.4; Sodium 137  Recent Lipid Panel    Component Value Date/Time   CHOL 134 11/20/2023 1628   TRIG 89 11/20/2023 1628   HDL 59 11/20/2023 1628   CHOLHDL 2.3 11/20/2023 1628   CHOLHDL 4.4 03/20/2021 0526   VLDL 7 03/20/2021 0526   LDLCALC 58 11/20/2023 1628    Physical Exam:    VS:  BP (!) 110/52   Pulse 70   Ht 5' 3 (1.6 m)   Wt 185 lb 6.4 oz (84.1 kg)   SpO2 98%   BMI 32.84 kg/m     Wt Readings from Last 3 Encounters:  02/17/24 185 lb 6.4 oz (84.1 kg)  11/20/23 175 lb 3.2 oz (79.5 kg)  11/03/23 168 lb 6.4 oz (76.4 kg)     GEN:  Well nourished, well developed in no acute distress HEENT: Normal NECK: No JVD; No carotid bruits LYMPHATICS: No lymphadenopathy CARDIAC: RRR, no murmurs, rubs, gallops RESPIRATORY:  Clear to auscultation without rales, wheezing or rhonchi  ABDOMEN: Soft, non-tender, non-distended MUSCULOSKELETAL: She has 1-2+ bilateral lower extremity ankle to knee pitting edema; No deformity  SKIN: Warm and dry NEUROLOGIC:  Alert and oriented x 3 PSYCHIATRIC:  Normal affect    Signed, Redell Leiter, MD  02/17/2024 3:13 PM    [facility]      [1]   Current Meds  Medication Sig   AIMOVIG  70 MG/ML SOAJ INJECT THE CONTENTS OF 1 PEN  SUBCUTANEOUSLY ONCE MONTHLY   albuterol  (VENTOLIN  HFA) 108 (90 Base) MCG/ACT inhaler Inhale 2 puffs into the lungs every 4 (four) hours as needed for shortness of breath.   aspirin  81 MG chewable tablet Chew 1 tablet (81 mg total) by mouth daily.   Cholecalciferol (VITAMIN D3) 1.25 MG (50000 UT) CAPS Take 1 capsule by mouth every 30 (thirty) days.   citalopram  (CELEXA ) 40 MG tablet TAKE 1 TABLET BY MOUTH DAILY   ezetimibe  (ZETIA ) 10 MG tablet TAKE 1 TABLET BY MOUTH DAILY   fluticasone  (FLONASE ) 50 MCG/ACT nasal spray Place 1 spray into both nostrils in the morning and at bedtime.   furosemide  (LASIX ) 40 MG tablet Take 1 tablet (40 mg total) by mouth 2 (two) times daily.   metolazone  (ZAROXOLYN ) 5 MG tablet Take 1 tablet (5 mg total) by mouth every other day. For 2 doses   nitroGLYCERIN  (NITROSTAT ) 0.4 MG SL tablet PLACE 1 TABLET UNDER THE TONGUE EVERY 5 MINUTES AS NEEDED FOR CHEST PAIN.   NURTEC 75 MG TBDP Take 75 mg by mouth as needed.   olopatadine (PATANOL) 0.1 % ophthalmic solution Place 1 drop into both eyes  2 (two) times daily as needed for allergies.   omeprazole  (PRILOSEC) 20 MG capsule TAKE 1 CAPSULE BY MOUTH DAILY   ondansetron  (ZOFRAN -ODT) 4 MG disintegrating tablet Take 4 mg by mouth 2 (two) times daily as needed for nausea/vomiting.   potassium chloride  SA (KLOR-CON  M) 20 MEQ tablet Take 1 tablet (20 mEq total) by mouth every other day.   PRESCRIPTION MEDICATION Take 1 tablet by mouth as needed (migraine). Nurtec - Samples from doctor   rOPINIRole  (REQUIP ) 1 MG tablet Take 1 tablet (1 mg total) by mouth at bedtime.   rosuvastatin  (CRESTOR ) 40 MG tablet TAKE 1 TABLET BY MOUTH DAILY   spironolactone  (ALDACTONE ) 25 MG tablet TAKE ONE-HALF TABLET BY MOUTH  DAILY   topiramate  (TOPAMAX ) 50 MG tablet TAKE 1 TABLET BY MOUTH DAILY   TRELEGY ELLIPTA  100-62.5-25 MCG/ACT AEPB USE 1 INHALATION BY MOUTH DAILY    "

## 2024-02-17 ENCOUNTER — Encounter: Payer: Self-pay | Admitting: Cardiology

## 2024-02-17 ENCOUNTER — Ambulatory Visit: Attending: Cardiology | Admitting: Cardiology

## 2024-02-17 VITALS — BP 110/52 | HR 70 | Ht 63.0 in | Wt 185.4 lb

## 2024-02-17 DIAGNOSIS — I25118 Atherosclerotic heart disease of native coronary artery with other forms of angina pectoris: Secondary | ICD-10-CM | POA: Diagnosis not present

## 2024-02-17 DIAGNOSIS — E78011 Heterozygous familial hypercholesterolemia (hefh): Secondary | ICD-10-CM

## 2024-02-17 DIAGNOSIS — I5022 Chronic systolic (congestive) heart failure: Secondary | ICD-10-CM

## 2024-02-17 MED ORDER — POTASSIUM CHLORIDE CRYS ER 20 MEQ PO TBCR: 20.0000 meq | EXTENDED_RELEASE_TABLET | Freq: Two times a day (BID) | ORAL | 3 refills | Status: AC

## 2024-02-17 MED ORDER — SPIRONOLACTONE 25 MG PO TABS: 25.0000 mg | ORAL_TABLET | Freq: Every day | ORAL | 3 refills | Status: AC

## 2024-02-17 MED ORDER — METOLAZONE 2.5 MG PO TABS: ORAL_TABLET | ORAL | 0 refills | Status: AC

## 2024-02-17 MED ORDER — TORSEMIDE 100 MG PO TABS: 50.0000 mg | ORAL_TABLET | Freq: Two times a day (BID) | ORAL | 3 refills | Status: AC

## 2024-02-17 NOTE — Addendum Note (Signed)
 Addended by: SHERRE ADE I on: 02/17/2024 03:32 PM   Modules accepted: Orders

## 2024-02-17 NOTE — Patient Instructions (Addendum)
 Medication Instructions:  Your physician has recommended you make the following change in your medication:   START: Spirinolactone 25 mg daily START: Potasium Chloride 20 meq two times daily START: Torsemide  50 mg two times daily START: Metalazone 2.5 mg take two tablets a week until weightis less than 170 lbs. STOP: Lasix    *If you need a refill on your cardiac medications before your next appointment, please call your pharmacy*  Lab Work: Your physician recommends that you return for lab work in:   Labs in 1 week: Hbg A1c, BMP, Pro BNP  If you have labs (blood work) drawn today and your tests are completely normal, you will receive your results only by: MyChart Message (if you have MyChart) OR A paper copy in the mail If you have any lab test that is abnormal or we need to change your treatment, we will call you to review the results.  Testing/Procedures: None  Follow-Up: At [facility], you and your health needs are our priority.  As part of our continuing mission to provide you with exceptional heart care, our providers are all part of one team.  This team includes your primary Cardiologist (physician) and Advanced Practice Providers or APPs (Physician Assistants and Nurse Practitioners) who all work together to provide you with the care you need, when you need it.  Your next appointment:   6 week(s)  Provider:   Redell Leiter, MD    We recommend signing up for the patient portal called MyChart.  Sign up information is provided on this After Visit Summary.  MyChart is used to connect with patients for Virtual Visits (Telemedicine).  Patients are able to view lab/test results, encounter notes, upcoming appointments, etc.  Non-urgent messages can be sent to your provider as well.   To learn more about what you can do with MyChart, go to [email or web address].   Other Instructions None

## 2024-02-18 ENCOUNTER — Ambulatory Visit: Admitting: Internal Medicine

## 2024-02-19 ENCOUNTER — Other Ambulatory Visit: Payer: Self-pay

## 2024-02-19 MED ORDER — ROSUVASTATIN CALCIUM 40 MG PO TABS: 40.0000 mg | ORAL_TABLET | Freq: Every day | ORAL | 2 refills | Status: AC

## 2024-04-04 ENCOUNTER — Ambulatory Visit: Admitting: Cardiology
